# Patient Record
Sex: Female | Born: 1970 | Race: Black or African American | Hispanic: No | Smoking: Never smoker
Health system: Southern US, Community
[De-identification: ages and names within clinical notes are randomized; demographics above are authoritative.]

## PROBLEM LIST (undated history)

## (undated) DIAGNOSIS — K219 Gastro-esophageal reflux disease without esophagitis: Secondary | ICD-10-CM

## (undated) DIAGNOSIS — H548 Legal blindness, as defined in USA: Secondary | ICD-10-CM

## (undated) DIAGNOSIS — H4722 Hereditary optic atrophy: Secondary | ICD-10-CM

## (undated) DIAGNOSIS — D649 Anemia, unspecified: Secondary | ICD-10-CM

## (undated) DIAGNOSIS — F32A Depression, unspecified: Secondary | ICD-10-CM

## (undated) DIAGNOSIS — D869 Sarcoidosis, unspecified: Secondary | ICD-10-CM

## (undated) DIAGNOSIS — N200 Calculus of kidney: Secondary | ICD-10-CM

## (undated) DIAGNOSIS — R519 Headache, unspecified: Secondary | ICD-10-CM

## (undated) DIAGNOSIS — I10 Essential (primary) hypertension: Secondary | ICD-10-CM

## (undated) DIAGNOSIS — R51 Headache: Secondary | ICD-10-CM

## (undated) DIAGNOSIS — F329 Major depressive disorder, single episode, unspecified: Secondary | ICD-10-CM

## (undated) HISTORY — DX: Sarcoidosis, unspecified: D86.9

## (undated) HISTORY — DX: Calculus of kidney: N20.0

## (undated) HISTORY — DX: Essential (primary) hypertension: I10

## (undated) HISTORY — DX: Gastro-esophageal reflux disease without esophagitis: K21.9

## (undated) HISTORY — DX: Legal blindness, as defined in USA: H54.8

## (undated) HISTORY — DX: Depression, unspecified: F32.A

## (undated) HISTORY — DX: Major depressive disorder, single episode, unspecified: F32.9

## (undated) HISTORY — DX: Hereditary optic atrophy: H47.22

## (undated) HISTORY — PX: ANKLE FRACTURE SURGERY: SHX122

---

## 1997-10-27 ENCOUNTER — Encounter: Admission: RE | Admit: 1997-10-27 | Discharge: 1997-10-27 | Payer: Self-pay | Admitting: Family Medicine

## 1997-10-29 ENCOUNTER — Encounter: Admission: RE | Admit: 1997-10-29 | Discharge: 1997-10-29 | Payer: Self-pay | Admitting: Family Medicine

## 1997-11-05 ENCOUNTER — Encounter: Admission: RE | Admit: 1997-11-05 | Discharge: 1997-11-05 | Payer: Self-pay | Admitting: Family Medicine

## 1997-11-14 ENCOUNTER — Encounter: Admission: RE | Admit: 1997-11-14 | Discharge: 1997-11-14 | Payer: Self-pay | Admitting: Family Medicine

## 1997-12-10 ENCOUNTER — Encounter: Admission: RE | Admit: 1997-12-10 | Discharge: 1997-12-10 | Payer: Self-pay | Admitting: Family Medicine

## 1997-12-23 ENCOUNTER — Encounter: Admission: RE | Admit: 1997-12-23 | Discharge: 1997-12-23 | Payer: Self-pay | Admitting: Family Medicine

## 1998-01-13 ENCOUNTER — Other Ambulatory Visit: Admission: RE | Admit: 1998-01-13 | Discharge: 1998-01-13 | Payer: Self-pay | Admitting: *Deleted

## 1998-01-13 ENCOUNTER — Encounter: Admission: RE | Admit: 1998-01-13 | Discharge: 1998-01-13 | Payer: Self-pay | Admitting: Family Medicine

## 1998-02-10 ENCOUNTER — Encounter: Admission: RE | Admit: 1998-02-10 | Discharge: 1998-02-10 | Payer: Self-pay | Admitting: Sports Medicine

## 1998-02-17 ENCOUNTER — Encounter: Admission: RE | Admit: 1998-02-17 | Discharge: 1998-02-17 | Payer: Self-pay | Admitting: Sports Medicine

## 1998-05-11 ENCOUNTER — Encounter: Admission: RE | Admit: 1998-05-11 | Discharge: 1998-05-11 | Payer: Self-pay | Admitting: Sports Medicine

## 1998-06-09 ENCOUNTER — Encounter: Admission: RE | Admit: 1998-06-09 | Discharge: 1998-06-09 | Payer: Self-pay | Admitting: Family Medicine

## 1998-08-05 ENCOUNTER — Encounter: Admission: RE | Admit: 1998-08-05 | Discharge: 1998-08-05 | Payer: Self-pay | Admitting: Family Medicine

## 1998-10-01 ENCOUNTER — Encounter: Admission: RE | Admit: 1998-10-01 | Discharge: 1998-10-01 | Payer: Self-pay | Admitting: Family Medicine

## 1998-10-08 ENCOUNTER — Encounter: Admission: RE | Admit: 1998-10-08 | Discharge: 1998-10-08 | Payer: Self-pay | Admitting: Family Medicine

## 1998-10-28 ENCOUNTER — Encounter: Admission: RE | Admit: 1998-10-28 | Discharge: 1998-10-28 | Payer: Self-pay | Admitting: Family Medicine

## 1998-11-09 ENCOUNTER — Encounter: Admission: RE | Admit: 1998-11-09 | Discharge: 1998-11-09 | Payer: Self-pay | Admitting: Family Medicine

## 1999-02-22 ENCOUNTER — Encounter: Admission: RE | Admit: 1999-02-22 | Discharge: 1999-02-22 | Payer: Self-pay | Admitting: Family Medicine

## 1999-03-30 ENCOUNTER — Encounter: Admission: RE | Admit: 1999-03-30 | Discharge: 1999-03-30 | Payer: Self-pay | Admitting: Family Medicine

## 1999-04-14 ENCOUNTER — Encounter: Admission: RE | Admit: 1999-04-14 | Discharge: 1999-04-14 | Payer: Self-pay | Admitting: Family Medicine

## 1999-04-27 ENCOUNTER — Other Ambulatory Visit: Admission: RE | Admit: 1999-04-27 | Discharge: 1999-05-20 | Payer: Self-pay | Admitting: *Deleted

## 1999-04-27 ENCOUNTER — Encounter: Admission: RE | Admit: 1999-04-27 | Discharge: 1999-04-27 | Payer: Self-pay | Admitting: Sports Medicine

## 1999-04-28 ENCOUNTER — Encounter: Admission: RE | Admit: 1999-04-28 | Discharge: 1999-04-28 | Payer: Self-pay | Admitting: Family Medicine

## 1999-07-20 ENCOUNTER — Encounter: Admission: RE | Admit: 1999-07-20 | Discharge: 1999-07-20 | Payer: Self-pay | Admitting: Family Medicine

## 1999-08-03 ENCOUNTER — Encounter: Admission: RE | Admit: 1999-08-03 | Discharge: 1999-08-03 | Payer: Self-pay | Admitting: Sports Medicine

## 1999-10-18 ENCOUNTER — Encounter: Admission: RE | Admit: 1999-10-18 | Discharge: 1999-10-18 | Payer: Self-pay | Admitting: Family Medicine

## 2000-01-17 ENCOUNTER — Encounter: Admission: RE | Admit: 2000-01-17 | Discharge: 2000-01-17 | Payer: Self-pay | Admitting: Family Medicine

## 2000-03-22 ENCOUNTER — Encounter: Admission: RE | Admit: 2000-03-22 | Discharge: 2000-03-22 | Payer: Self-pay | Admitting: Family Medicine

## 2000-04-17 ENCOUNTER — Encounter: Admission: RE | Admit: 2000-04-17 | Discharge: 2000-04-17 | Payer: Self-pay | Admitting: Family Medicine

## 2000-07-13 ENCOUNTER — Encounter: Admission: RE | Admit: 2000-07-13 | Discharge: 2000-07-13 | Payer: Self-pay | Admitting: Family Medicine

## 2000-10-10 ENCOUNTER — Encounter: Admission: RE | Admit: 2000-10-10 | Discharge: 2000-10-10 | Payer: Self-pay | Admitting: Family Medicine

## 2001-01-09 ENCOUNTER — Encounter: Admission: RE | Admit: 2001-01-09 | Discharge: 2001-01-09 | Payer: Self-pay | Admitting: Family Medicine

## 2001-04-27 ENCOUNTER — Encounter: Admission: RE | Admit: 2001-04-27 | Discharge: 2001-04-27 | Payer: Self-pay | Admitting: Family Medicine

## 2001-07-02 ENCOUNTER — Encounter: Admission: RE | Admit: 2001-07-02 | Discharge: 2001-07-02 | Payer: Self-pay | Admitting: Family Medicine

## 2001-07-04 ENCOUNTER — Encounter: Admission: RE | Admit: 2001-07-04 | Discharge: 2001-07-04 | Payer: Self-pay | Admitting: Family Medicine

## 2001-08-22 ENCOUNTER — Encounter: Admission: RE | Admit: 2001-08-22 | Discharge: 2001-08-22 | Payer: Self-pay | Admitting: Family Medicine

## 2001-10-30 ENCOUNTER — Encounter: Admission: RE | Admit: 2001-10-30 | Discharge: 2001-10-30 | Payer: Self-pay | Admitting: Family Medicine

## 2002-01-14 ENCOUNTER — Encounter: Admission: RE | Admit: 2002-01-14 | Discharge: 2002-01-14 | Payer: Self-pay | Admitting: Family Medicine

## 2002-03-18 ENCOUNTER — Encounter: Admission: RE | Admit: 2002-03-18 | Discharge: 2002-03-18 | Payer: Self-pay | Admitting: Family Medicine

## 2002-05-28 ENCOUNTER — Encounter: Admission: RE | Admit: 2002-05-28 | Discharge: 2002-05-28 | Payer: Self-pay | Admitting: Family Medicine

## 2002-07-02 ENCOUNTER — Encounter: Admission: RE | Admit: 2002-07-02 | Discharge: 2002-07-02 | Payer: Self-pay | Admitting: Sports Medicine

## 2002-10-09 ENCOUNTER — Encounter: Admission: RE | Admit: 2002-10-09 | Discharge: 2002-10-09 | Payer: Self-pay | Admitting: Family Medicine

## 2003-02-12 ENCOUNTER — Encounter: Admission: RE | Admit: 2003-02-12 | Discharge: 2003-02-12 | Payer: Self-pay | Admitting: Family Medicine

## 2003-06-11 ENCOUNTER — Encounter: Admission: RE | Admit: 2003-06-11 | Discharge: 2003-06-11 | Payer: Self-pay | Admitting: Family Medicine

## 2003-08-05 ENCOUNTER — Encounter: Admission: RE | Admit: 2003-08-05 | Discharge: 2003-08-05 | Payer: Self-pay | Admitting: Family Medicine

## 2003-08-27 ENCOUNTER — Other Ambulatory Visit: Admission: RE | Admit: 2003-08-27 | Discharge: 2003-08-27 | Payer: Self-pay | Admitting: Family Medicine

## 2003-08-27 ENCOUNTER — Encounter: Admission: RE | Admit: 2003-08-27 | Discharge: 2003-08-27 | Payer: Self-pay | Admitting: Family Medicine

## 2003-10-13 ENCOUNTER — Encounter: Admission: RE | Admit: 2003-10-13 | Discharge: 2003-10-13 | Payer: Self-pay | Admitting: Sports Medicine

## 2004-03-08 ENCOUNTER — Ambulatory Visit: Payer: Self-pay | Admitting: Family Medicine

## 2004-07-29 ENCOUNTER — Ambulatory Visit: Payer: Self-pay

## 2004-09-04 ENCOUNTER — Encounter (INDEPENDENT_AMBULATORY_CARE_PROVIDER_SITE_OTHER): Payer: Self-pay | Admitting: *Deleted

## 2004-09-23 ENCOUNTER — Ambulatory Visit: Payer: Self-pay | Admitting: Sports Medicine

## 2004-09-23 ENCOUNTER — Other Ambulatory Visit: Admission: RE | Admit: 2004-09-23 | Discharge: 2004-09-23 | Payer: Self-pay | Admitting: Family Medicine

## 2004-11-09 ENCOUNTER — Ambulatory Visit: Payer: Self-pay | Admitting: Family Medicine

## 2005-02-01 ENCOUNTER — Ambulatory Visit: Payer: Self-pay | Admitting: Sports Medicine

## 2005-04-11 ENCOUNTER — Ambulatory Visit: Payer: Self-pay | Admitting: Family Medicine

## 2005-05-08 ENCOUNTER — Inpatient Hospital Stay (HOSPITAL_COMMUNITY): Admission: AD | Admit: 2005-05-08 | Discharge: 2005-05-08 | Payer: Self-pay | Admitting: *Deleted

## 2005-05-10 ENCOUNTER — Ambulatory Visit: Payer: Self-pay | Admitting: Sports Medicine

## 2005-08-04 ENCOUNTER — Ambulatory Visit: Payer: Self-pay | Admitting: Family Medicine

## 2005-08-18 ENCOUNTER — Ambulatory Visit: Payer: Self-pay | Admitting: Family Medicine

## 2005-09-24 ENCOUNTER — Emergency Department (HOSPITAL_COMMUNITY): Admission: EM | Admit: 2005-09-24 | Discharge: 2005-09-24 | Payer: Self-pay | Admitting: Family Medicine

## 2005-10-11 ENCOUNTER — Ambulatory Visit: Payer: Self-pay | Admitting: Sports Medicine

## 2005-10-18 ENCOUNTER — Ambulatory Visit: Payer: Self-pay | Admitting: Sports Medicine

## 2005-11-07 ENCOUNTER — Ambulatory Visit: Payer: Self-pay | Admitting: Family Medicine

## 2005-12-19 ENCOUNTER — Ambulatory Visit: Payer: Self-pay | Admitting: Family Medicine

## 2005-12-30 ENCOUNTER — Ambulatory Visit: Payer: Self-pay | Admitting: Family Medicine

## 2006-02-13 ENCOUNTER — Ambulatory Visit: Payer: Self-pay | Admitting: Family Medicine

## 2006-03-13 ENCOUNTER — Ambulatory Visit: Payer: Self-pay | Admitting: Family Medicine

## 2006-07-06 ENCOUNTER — Ambulatory Visit: Payer: Self-pay | Admitting: Family Medicine

## 2006-07-18 ENCOUNTER — Ambulatory Visit: Payer: Self-pay | Admitting: Family Medicine

## 2006-08-03 DIAGNOSIS — I10 Essential (primary) hypertension: Secondary | ICD-10-CM | POA: Insufficient documentation

## 2006-08-04 ENCOUNTER — Encounter (INDEPENDENT_AMBULATORY_CARE_PROVIDER_SITE_OTHER): Payer: Self-pay | Admitting: *Deleted

## 2006-08-07 ENCOUNTER — Ambulatory Visit: Payer: Self-pay | Admitting: Sports Medicine

## 2006-09-20 ENCOUNTER — Telehealth (INDEPENDENT_AMBULATORY_CARE_PROVIDER_SITE_OTHER): Payer: Self-pay | Admitting: *Deleted

## 2006-10-19 ENCOUNTER — Telehealth: Payer: Self-pay | Admitting: *Deleted

## 2006-10-20 ENCOUNTER — Ambulatory Visit: Payer: Self-pay | Admitting: Family Medicine

## 2007-03-27 ENCOUNTER — Ambulatory Visit: Payer: Self-pay | Admitting: Family Medicine

## 2007-03-27 ENCOUNTER — Encounter: Payer: Self-pay | Admitting: Family Medicine

## 2007-03-27 ENCOUNTER — Telehealth (INDEPENDENT_AMBULATORY_CARE_PROVIDER_SITE_OTHER): Payer: Self-pay | Admitting: *Deleted

## 2007-04-16 ENCOUNTER — Encounter (INDEPENDENT_AMBULATORY_CARE_PROVIDER_SITE_OTHER): Payer: Self-pay | Admitting: *Deleted

## 2007-04-16 ENCOUNTER — Ambulatory Visit: Payer: Self-pay | Admitting: Family Medicine

## 2007-06-14 ENCOUNTER — Encounter (INDEPENDENT_AMBULATORY_CARE_PROVIDER_SITE_OTHER): Payer: Self-pay | Admitting: Family Medicine

## 2007-06-14 ENCOUNTER — Ambulatory Visit: Payer: Self-pay | Admitting: Family Medicine

## 2007-06-14 LAB — CONVERTED CEMR LAB
BUN: 11 mg/dL (ref 6–23)
Chlamydia, DNA Probe: NEGATIVE
Chloride: 102 meq/L (ref 96–112)
Creatinine, Ser: 0.74 mg/dL (ref 0.40–1.20)
Glucose, Bld: 96 mg/dL (ref 70–99)

## 2007-06-29 ENCOUNTER — Encounter (INDEPENDENT_AMBULATORY_CARE_PROVIDER_SITE_OTHER): Payer: Self-pay | Admitting: Family Medicine

## 2007-09-05 ENCOUNTER — Emergency Department (HOSPITAL_COMMUNITY): Admission: EM | Admit: 2007-09-05 | Discharge: 2007-09-05 | Payer: Self-pay | Admitting: Emergency Medicine

## 2007-09-10 ENCOUNTER — Encounter (INDEPENDENT_AMBULATORY_CARE_PROVIDER_SITE_OTHER): Payer: Self-pay | Admitting: Family Medicine

## 2007-09-10 ENCOUNTER — Ambulatory Visit: Payer: Self-pay | Admitting: Family Medicine

## 2007-09-10 LAB — CONVERTED CEMR LAB
AST: 16 units/L (ref 0–37)
Albumin: 4.4 g/dL (ref 3.5–5.2)
Alkaline Phosphatase: 31 units/L — ABNORMAL LOW (ref 39–117)
Basophils Absolute: 0 10*3/uL (ref 0.0–0.1)
Basophils Relative: 0 % (ref 0–1)
Bilirubin Urine: NEGATIVE
Eosinophils Absolute: 0 10*3/uL (ref 0.0–0.7)
Eosinophils Relative: 0 % (ref 0–5)
Glucose, Bld: 103 mg/dL — ABNORMAL HIGH (ref 70–99)
HCT: 42.6 % (ref 36.0–46.0)
MCHC: 33.6 g/dL (ref 30.0–36.0)
MCV: 95.3 fL (ref 78.0–100.0)
Neutrophils Relative %: 66 % (ref 43–77)
Nitrite: NEGATIVE
Platelets: 353 10*3/uL (ref 150–400)
Potassium: 3.7 meq/L (ref 3.5–5.3)
Protein, U semiquant: NEGATIVE
RDW: 13.2 % (ref 11.5–15.5)
Sodium: 139 meq/L (ref 135–145)
Total Bilirubin: 1 mg/dL (ref 0.3–1.2)
Total Protein: 7.5 g/dL (ref 6.0–8.3)
Urobilinogen, UA: 0.2
WBC Urine, dipstick: NEGATIVE
WBC: 5.4 10*3/uL (ref 4.0–10.5)

## 2008-01-13 ENCOUNTER — Emergency Department (HOSPITAL_COMMUNITY): Admission: EM | Admit: 2008-01-13 | Discharge: 2008-01-13 | Payer: Self-pay | Admitting: Family Medicine

## 2008-03-06 ENCOUNTER — Telehealth: Payer: Self-pay | Admitting: *Deleted

## 2008-03-07 ENCOUNTER — Ambulatory Visit: Payer: Self-pay | Admitting: Family Medicine

## 2008-04-22 ENCOUNTER — Telehealth: Payer: Self-pay | Admitting: *Deleted

## 2008-04-24 ENCOUNTER — Telehealth (INDEPENDENT_AMBULATORY_CARE_PROVIDER_SITE_OTHER): Payer: Self-pay | Admitting: *Deleted

## 2008-05-12 ENCOUNTER — Telehealth: Payer: Self-pay | Admitting: *Deleted

## 2008-08-26 ENCOUNTER — Ambulatory Visit: Payer: Self-pay | Admitting: Family Medicine

## 2008-08-26 ENCOUNTER — Encounter (INDEPENDENT_AMBULATORY_CARE_PROVIDER_SITE_OTHER): Payer: Self-pay | Admitting: Family Medicine

## 2008-08-26 DIAGNOSIS — H4722 Hereditary optic atrophy: Secondary | ICD-10-CM

## 2008-08-26 HISTORY — DX: Hereditary optic atrophy: H47.22

## 2008-08-26 LAB — CONVERTED CEMR LAB
Bilirubin Urine: NEGATIVE
Chloride: 100 meq/L (ref 96–112)
Cholesterol: 178 mg/dL (ref 0–200)
Ketones, urine, test strip: NEGATIVE
LDL Cholesterol: 72 mg/dL (ref 0–99)
Nitrite: NEGATIVE
Potassium: 3.7 meq/L (ref 3.5–5.3)
Protein, U semiquant: NEGATIVE
Sodium: 138 meq/L (ref 135–145)
Total CHOL/HDL Ratio: 2.8
Triglycerides: 211 mg/dL — ABNORMAL HIGH (ref <150)
Urobilinogen, UA: 1
VLDL: 42 mg/dL — ABNORMAL HIGH (ref 0–40)

## 2008-08-28 ENCOUNTER — Encounter (INDEPENDENT_AMBULATORY_CARE_PROVIDER_SITE_OTHER): Payer: Self-pay | Admitting: Family Medicine

## 2008-09-15 ENCOUNTER — Telehealth: Payer: Self-pay | Admitting: *Deleted

## 2008-09-17 ENCOUNTER — Telehealth: Payer: Self-pay | Admitting: *Deleted

## 2008-09-18 ENCOUNTER — Telehealth: Payer: Self-pay | Admitting: *Deleted

## 2008-10-08 ENCOUNTER — Ambulatory Visit: Payer: Self-pay | Admitting: Family Medicine

## 2008-10-08 ENCOUNTER — Encounter (INDEPENDENT_AMBULATORY_CARE_PROVIDER_SITE_OTHER): Payer: Self-pay | Admitting: *Deleted

## 2008-12-15 ENCOUNTER — Encounter: Payer: Self-pay | Admitting: Family Medicine

## 2008-12-22 ENCOUNTER — Ambulatory Visit (HOSPITAL_COMMUNITY): Admission: RE | Admit: 2008-12-22 | Discharge: 2008-12-22 | Payer: Self-pay | Admitting: Dermatology

## 2008-12-24 ENCOUNTER — Encounter: Payer: Self-pay | Admitting: Family Medicine

## 2009-01-22 ENCOUNTER — Ambulatory Visit: Payer: Self-pay | Admitting: Family Medicine

## 2009-01-22 DIAGNOSIS — D863 Sarcoidosis of skin: Secondary | ICD-10-CM

## 2009-01-22 LAB — CONVERTED CEMR LAB
Bilirubin Urine: NEGATIVE
Glucose, Urine, Semiquant: NEGATIVE
Ketones, urine, test strip: NEGATIVE
Nitrite: NEGATIVE
Protein, U semiquant: NEGATIVE
Specific Gravity, Urine: 1.015
Urobilinogen, UA: 0.2
WBC Urine, dipstick: NEGATIVE
pH: 7

## 2009-02-10 ENCOUNTER — Ambulatory Visit: Payer: Self-pay | Admitting: Family Medicine

## 2009-02-18 ENCOUNTER — Ambulatory Visit: Payer: Self-pay | Admitting: Family Medicine

## 2009-03-18 ENCOUNTER — Ambulatory Visit: Payer: Self-pay | Admitting: Family Medicine

## 2009-05-13 ENCOUNTER — Ambulatory Visit: Payer: Self-pay | Admitting: Family Medicine

## 2009-05-13 DIAGNOSIS — F341 Dysthymic disorder: Secondary | ICD-10-CM

## 2009-08-06 ENCOUNTER — Telehealth: Payer: Self-pay | Admitting: Family Medicine

## 2009-11-03 ENCOUNTER — Encounter: Payer: Self-pay | Admitting: Family Medicine

## 2009-11-03 ENCOUNTER — Ambulatory Visit: Payer: Self-pay | Admitting: Family Medicine

## 2009-11-03 LAB — CONVERTED CEMR LAB
ALT: 30 units/L (ref 0–35)
AST: 21 units/L (ref 0–37)
Albumin: 4.3 g/dL (ref 3.5–5.2)
Alkaline Phosphatase: 45 units/L (ref 39–117)
BUN: 11 mg/dL (ref 6–23)
CO2: 22 meq/L (ref 19–32)
Calcium: 10 mg/dL (ref 8.4–10.5)
Chloride: 102 meq/L (ref 96–112)
Creatinine, Ser: 0.89 mg/dL (ref 0.40–1.20)
Glucose, Bld: 105 mg/dL — ABNORMAL HIGH (ref 70–99)
HCT: 40.1 % (ref 36.0–46.0)
Hemoglobin: 13.8 g/dL (ref 12.0–15.0)
MCHC: 34.4 g/dL (ref 30.0–36.0)
MCV: 92.6 fL (ref 78.0–100.0)
Platelets: 269 10*3/uL (ref 150–400)
Potassium: 3.6 meq/L (ref 3.5–5.3)
RBC: 4.33 M/uL (ref 3.87–5.11)
RDW: 13.7 % (ref 11.5–15.5)
Sodium: 137 meq/L (ref 135–145)
TSH: 1.982 microintl units/mL (ref 0.350–4.500)
Total Bilirubin: 0.7 mg/dL (ref 0.3–1.2)
Total Protein: 7.4 g/dL (ref 6.0–8.3)
Vit D, 25-Hydroxy: 21 ng/mL — ABNORMAL LOW (ref 30–89)
WBC: 5.1 10*3/uL (ref 4.0–10.5)

## 2009-11-09 ENCOUNTER — Encounter: Payer: Self-pay | Admitting: Family Medicine

## 2009-12-10 ENCOUNTER — Telehealth: Payer: Self-pay | Admitting: Family Medicine

## 2010-01-01 ENCOUNTER — Telehealth: Payer: Self-pay | Admitting: Family Medicine

## 2010-03-15 ENCOUNTER — Telehealth: Payer: Self-pay | Admitting: Family Medicine

## 2010-03-17 ENCOUNTER — Ambulatory Visit: Payer: Self-pay | Admitting: Family Medicine

## 2010-03-17 DIAGNOSIS — E663 Overweight: Secondary | ICD-10-CM | POA: Insufficient documentation

## 2010-03-23 ENCOUNTER — Telehealth: Payer: Self-pay | Admitting: Family Medicine

## 2010-03-25 ENCOUNTER — Telehealth: Payer: Self-pay | Admitting: Family Medicine

## 2010-04-05 ENCOUNTER — Ambulatory Visit: Payer: Self-pay | Admitting: Family Medicine

## 2010-04-05 ENCOUNTER — Encounter: Payer: Self-pay | Admitting: Family Medicine

## 2010-04-05 LAB — CONVERTED CEMR LAB: Beta hcg, urine, semiquantitative: NEGATIVE

## 2010-05-16 ENCOUNTER — Telehealth: Payer: Self-pay | Admitting: Family Medicine

## 2010-06-25 ENCOUNTER — Telehealth: Payer: Self-pay | Admitting: *Deleted

## 2010-07-08 NOTE — Assessment & Plan Note (Signed)
Summary: f/u last visit/eo   Vital Signs:  Patient profile:   40 year old female Weight:      159.5 pounds BMI:     28.36 Temp:     97.5 degrees F Pulse rate:   85 / minute BP sitting:   123 / 81  (right arm)  Vitals Entered By: Starleen Blue RN (March 18, 2009 3:35 PM) CC: f/u Is Patient Diabetic? No Pain Assessment Patient in pain? no        Primary Care Provider:  Helane Rima DO  CC:  f/u.  History of Present Illness: 40 y/o AAF presenting in place of son since he is at [facility] for nausea/vomiting/abdominal pain unknown etiology.  Today, she states that she "feels awful" and is upset that she has gained 4 pounds since our last visit one month ago. She has been stressed about her son's health. He has been dealing with the above issue for  ~ 2 weeks now and is still in the hospital. She has not been exercising (she usually does this daily) or eating well since she has been staying with her son. She also wants to make sure that she hasn't gotten anything from the hospital and asks me to look at her neck for enlarged lymph nodes.   Habits & Providers  Alcohol-Tobacco-Diet     Tobacco Status: never  Current Medications (verified): 1)  Omeprazole 20 Mg Cpdr (Omeprazole) .... Take 1 Capsule By Mouth Once A Day 2)  Citalopram Hydrobromide 20 Mg Tabs (Citalopram Hydrobromide) .... One By Mouth Daily, Once Daily 3)  Sprintec 28 0.25-35 Mg-Mcg Tabs (Norgestimate-Eth Estradiol) .Marland Kitchen.. 1 Tablet By Mouth Daily.  Dispense 3 Packs 4)  Lisinopril-Hydrochlorothiazide 20-25 Mg  Tabs (Lisinopril-Hydrochlorothiazide) .... Take 1 Tab By Mouth Daily 5)  Zyrtec Allergy 10 Mg Tabs (Cetirizine Hcl) .Marland Kitchen.. 1 Tablet By Mouth Daily For Allergies 6)  Plaquenil 200 Mg Tabs (Hydroxychloroquine Sulfate) .... One By Mouth Two Times A Day 7)  Fluticasone Propionate 50 Mcg/act  Susp (Fluticasone Propionate) .... 2 Sprays Each Nostril Once Daily  Allergies (verified): No Known Drug Allergies  Past  History:  Past medical, surgical, family and social histories (including risk factors) reviewed for relevance to current acute and chronic problems.  Past Medical History: Reviewed history from 01/22/2009 and no changes required. 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 Emily Filbert, [facility], [phone] Depression GERD HTN  Past Surgical History: Reviewed history from 02/18/2009 and no changes required. cystoscopy - wnl - for microscopic hematuria - 12/04/1996 Mirena IUD- 03/2004 - 09/23/2004 C. section  Family History: Reviewed history from 08/26/2008 and no changes required. father with HTN, brain ca (deceased in Nov 04, 2002) Mother died at 78 yo of liver failure secondary to EtOH No breast ca in family.  Social History: Reviewed history from 08/26/2008 and no changes required. Separated from husband.  Has son 18yo who lives at home with her (born 11-03-1989).   No tob, etoh, drugs.  Father with end stage brain ca passed 8/04.  Works 40 hours/week at [employer] of the [employer], uses bus for transportation.  Able to read with a magnifying glass.  Review of Systems General:  Complains of fatigue; denies chills and fever. ENT:  Denies decreased hearing, difficulty swallowing, earache, hoarseness, sinus pressure, and sore throat. Resp:  Denies cough. Psych:  Complains of anxiety and depression;  denies easily angered, panic attacks, suicidal thoughts/plans, and thoughts /plans of harming others. Allergy:  Complains of seasonal allergies.  Physical Exam  General:  alert, appropriate dress, and normal appearance. vitals reviewed. Head:  normocephalic and atraumatic.   Ears:  R ear normal and L ear normal.   Nose:  no nasal discharge.   Mouth:  pharynx pink and moist.   Neck:  no cervical lymphadenopathy.   Lungs:  CTAB Heart:  RRR no m/r/g Psych:   Oriented X3 and slightly anxious.     Impression & Recommendations:  Problem # 1:  MALAISE AND FATIGUE (ICD-780.79) Assessment New  Acute and due to stress of dealing with son in hospital with unknown etiology of s/s. She has not been taking care of herself. She usually exercises daily and eats a healthy diet but has not done this since staying with her son. She is also concerned that she will catch something while there. Reassured patient that while we don't know why her son is having his s/s, we are doing a thorough work-up and keeping him confortable and healthy. Advised her to remember to take care of her health as well.   Orders: [facility]- Est Level  3 (04540)  Problem # 2:  HYPERTENSION, BENIGN SYSTEMIC (ICD-401.1) Assessment: Unchanged  Her updated medication list for this problem includes:    Lisinopril-hydrochlorothiazide 20-25 Mg Tabs (Lisinopril-hydrochlorothiazide) .Marland Kitchen... Take 1 tab by mouth daily  Orders: [facility]- Est Level  3 (98119)  Complete Medication List: 1)  Omeprazole 20 Mg Cpdr (Omeprazole) .... Take 1 capsule by mouth once a day 2)  Citalopram Hydrobromide 20 Mg Tabs (Citalopram hydrobromide) .... One by mouth daily, once daily 3)  Sprintec 28 0.25-35 Mg-mcg Tabs (Norgestimate-eth estradiol) .Marland Kitchen.. 1 tablet by mouth daily.  dispense 3 packs 4)  Lisinopril-hydrochlorothiazide 20-25 Mg Tabs (Lisinopril-hydrochlorothiazide) .... Take 1 tab by mouth daily 5)  Zyrtec Allergy 10 Mg Tabs (Cetirizine hcl) .Marland Kitchen.. 1 tablet by mouth daily for allergies 6)  Plaquenil 200 Mg Tabs (Hydroxychloroquine sulfate) .... One by mouth two times a day 7)  Fluticasone Propionate 50 Mcg/act Susp (Fluticasone propionate) .... 2 sprays each nostril once daily  Other Orders: Influenza Vaccine MCR (14782)  Patient Instructions: 1)  I'm glad that you came in today. We'll keep working on finding out what is going on with your son. Until then, we will keep him comfortable and healthy.  2)  Take care of  yourself. This is a stressful time. Continue exercise and eating a healthy diet.  3)  Your blood pressure looks great. Prescriptions: FLUTICASONE PROPIONATE 50 MCG/ACT  SUSP (FLUTICASONE PROPIONATE) 2 sprays each nostril once daily  #1 vial x 3   Entered and Authorized by:   Helane Rima DO   Signed by:   Helane Rima DO on 03/18/2009   Method used:   Electron[facility] to        [facility] [address] [phone]* (retail)       [address]       [place], [place]  [address]       Ph: [phone] or [phone]       Fax: [phone]   RxID:   [id number] PLAQUENIL 200 MG TABS (HYDROXYCHLOROQUINE SULFATE) one by mouth two times a day  #60 x 3   Entered and Authorized by:   Helane Rima DO   Signed by:   Helane Rima DO on 03/18/2009   Method used:   [facility]ronically  [facility]        [facility] [address] [phone]* (retail)       [address]       [place], [place]  [address]       Ph: [phone] or [phone]       Fax: [phone]   RxID:   [id number] ZYRTEC ALLERGY 10 MG TABS (CETIRIZINE HCL) 1 tablet by mouth daily for allergies  #30 x 4   Entered and Authorized by:   Helane Rima DO   Signed by:   Helane Rima DO on 03/18/2009   Method used:   [facility]        [facility] [address] [phone]* (retail)       [address]       [place], [place]  [address]       Ph: [phone] or [phone]       Fax: [phone]   RxID:   [id number] LISINOPRIL-HYDROCHLOROTHIAZIDE 20-25 MG  TABS (LISINOPRIL-HYDROCHLOROTHIAZIDE) Take 1 tab by mouth daily  #30 x 5   Entered and Authorized by:   Helane Rima DO   Signed by:   Helane Rima DO on 03/18/2009   Method used:   Electronically to        [facility] [address] [phone]* (retail)       [address]       [place], [place]  [address]       Ph: [phone] or [phone]       Fax: [phone]   RxID:   [id number] SPRINTEC 28  0.25-35 MG-MCG TABS (NORGESTIMATE-ETH ESTRADIOL) 1 tablet by mouth daily.  dispense 3 packs  #3 x 3   Entered and Authorized by:   Helane Rima DO   Signed by:   Helane Rima DO on 03/18/2009   Method used:   [facility]ronically to        [facility] [address] [phone]* (retail)       [address]       [place], [place]  [address]       Ph: [phone] or [phone]       Fax: [phone]   RxID:   [id number] CITALOPRAM HYDROBROMIDE 20 MG TABS (CITALOPRAM HYDROBROMIDE) one by mouth daily, once daily  #30 x 0   Entered and Authorized by:   Helane Rima DO   Signed by:   Helane Rima DO on 03/18/2009   Method used:   Electronically to        [facility] [address] [phone]* (retail)       [address]       [place], [place]  [address]       Ph: [phone] or [phone]       Fax: [phone]   RxID:   [id number] OMEPRAZOLE 20 MG CPDR (OMEPRAZOLE) Take 1 capsule by mouth once a day  #30 Capsule x 11   Entered and Authorized by:   Helane Rima DO   Signed by:   Helane Rima DO on 03/18/2009   Method used:   [facility]ronically to        [facility] [address] [phone]* (retail)       [address]       [place]  [place], [place]  [address]       Ph: [phone] or [phone]       Fax: [phone]   RxID:   [id number]    Influenza Vaccine    Vaccine Type: Fluvax MCR    Site: left deltoid    Mfr: GlaxoSmithKline    Dose: 0.5 ml    Route: IM    Given by: Theresia Lo RN    Exp. Date: 12/03/2009    Lot #: [id number]    VIS given: 01/13/2009  Flu Vaccine Consent Questions    Do you have a history of severe allergic reactions to this vaccine? no    Any prior history of allergic reactions to egg and/or gelatin? no    Do you have a sensitivity to the preservative Thimersol? no    Do you have a past history of Guillan-Barre Syndrome? no    Do you currently have an acute febrile illness? no    Have you ever  had a severe reaction to latex? no    Vaccine information given and explained to patient? yes    Are you currently pregnant? no    Prevention & Chronic Care Immunizations   Influenza vaccine: Fluvax MCR  (03/18/2009)   Influenza vaccine due: 03/07/2009    Tetanus booster: 08/26/2008: given   Tetanus booster due: 08/27/2018    Pneumococcal vaccine: Not documented  Other Screening   Pap smear: NEGATIVE FOR INTRAEPITHELIAL LESIONS OR MALIGNANCY.  (08/26/2008)   Pap smear due: 06/2009   Smoking status: never  (03/18/2009)  Lipids   Total Cholesterol: 178  (08/26/2008)   LDL: 72  (08/26/2008)   LDL Direct: Not documented   HDL: 64  (08/26/2008)   Triglycerides: 211  (08/26/2008)  Hypertension   Last Blood Pressure: 123 / 81  (03/18/2009)   Serum creatinine: 0.83  (08/26/2008)   Serum potassium 3.7  (08/26/2008)    Hypertension flowsheet reviewed?: Yes   Progress toward BP goal: At goal  Self-Management Support :   Personal Goals (by the next clinic visit) :      Personal blood pressure goal: 140/90  (02/10/2009)   Patient will work on the following items until the next clinic visit to reach self-care goals:     Medications and monitoring: take my medicines every day  (03/18/2009)     Eating: drink diet soda or water instead of juice or soda, use fresh or frozen vegetables, eat foods that are low in salt, eat baked foods instead of fried foods, eat fruit for snacks and desserts  (02/18/2009)     Activity: take a 30 minute walk every day, take the stairs instead of the elevator, park at the far end of the parking lot  (03/18/2009)    Hypertension self-management support: Written self-care plan  (03/18/2009)   Hypertension self-care plan printed.    Hypertension self-management support not done because: Good outcomes  (02/10/2009)

## 2010-07-08 NOTE — Assessment & Plan Note (Signed)
Summary: HA with periods. /[place]/Allison Steele   Vital Signs:  Patient profile:   40 year old female Height:      63 inches Weight:      164.50 pounds BMI:     29.25 BSA:     1.78 Pulse rate:   94 / minute BP sitting:   128 / 90  Vitals Entered By: Jone Baseman CMA (March 17, 2010 9:28 AM) CC: HA with periods Is Patient Diabetic? No Pain Assessment Patient in pain? yes     Location: head Intensity: 4   Primary Care Provider:  Helane Rima DO  CC:  HA with periods.  History of Present Illness: 40 yo F:  1. HA: Every time she has her menses, lasts about one week, never happened before OCPs, checks BP when in pain and highest = 156/93. OCPs were started for vaginal bleeding. Has tolerated Depo in the past. HA is generalized, pounding, sometimes associated with photo/phonophobia, and nausea. Made slightly better with NSAIDs.  2. Weight Gain: "I'm hungry all the time." Was working out regularly, but has not been going as often.  Habits & Providers  Alcohol-Tobacco-Diet     Alcohol drinks/day: 0     Tobacco Status: never     Tobacco Counseling: not indicated; no tobacco use  Current Medications (verified): 1)  Omeprazole 20 Mg Cpdr (Omeprazole) .... Take 1 Capsule By Mouth Once A Day 2)  Citalopram Hydrobromide 20 Mg Tabs (Citalopram Hydrobromide) .... One By Mouth Daily, Once Daily 3)  Lisinopril-Hydrochlorothiazide 20-25 Mg  Tabs (Lisinopril-Hydrochlorothiazide) .... Take 1 Tab By Mouth Daily 4)  Zyrtec Allergy 10 Mg Tabs (Cetirizine Hcl) .Marland Kitchen.. 1 Tablet By Mouth Daily For Allergies 5)  Plaquenil 200 Mg Tabs (Hydroxychloroquine Sulfate) .... One By Mouth Two Times A Day 6)  Fluticasone Propionate 50 Mcg/act  Susp (Fluticasone Propionate) .... 2 Sprays Each Nostril Once Daily 7)  Miralax   Powd (Polyethylene Glycol 3350) .Marland Kitchen.. 17g By Mouth Once Daily As Needed Constipation 8)  Wellbutrin Sr 150 Mg Xr12h-Tab (Bupropion Hcl) .... One By Mouth Daily 9)  Hydrocodone-Acetaminophen  5-325 Mg Tabs (Hydrocodone-Acetaminophen) .Marland Kitchen.. 1 By Mouth Up To 4 Times Per Day As Needed For Pain  Allergies (verified): No Known Drug Allergies PMH-FH-SH reviewed for relevance  Review of Systems General:  Denies chills and fever. ENT:  Denies earache, nasal congestion, and sore throat. CV:  Denies chest pain or discomfort and shortness of breath with exertion. Resp:  Denies cough. GI:  Denies change in bowel habits. Neuro:  Complains of headaches.  Physical Exam  General:  Alert, appropriate dress, and normal appearance. Vitals reviewed. Head:  Normocephalic and atraumatic without obvious abnormalities.  Eyes:  Decreased vision at baseline. Pupils equal, pupils round, and pupils reactive to light.   Ears:  R ear normal and L ear normal.   Nose:  External nasal examination shows no deformity or inflammation. Nasal mucosa are pink and moist without lesions or exudates. Mouth:  Oral mucosa and oropharynx without lesions or exudates.  Teeth in good repair. Neck:  No deformities, masses, or tenderness noted. Neurologic:  Alert & oriented X3, cranial nerves II-XII intact, strength normal in all extremities, sensation intact to light touch, and gait normal.   Psych:  Memory intact for recent and remote, normally interactive, and slightly anxious.     Impression & Recommendations:  Problem # 1:  HEADACHE, MENSTRUAL (ICD-784.0)  No Imitrex 2/2 SSRI. Stop OCPs.  Her updated medication list for this problem includes:  Hydrocodone-acetaminophen 5-325 Mg Tabs (Hydrocodone-acetaminophen) .Marland Kitchen... 1 by mouth up to 4 times per day as needed for pain  Orders: [facility]- Est  Level 4 (81191)  Problem # 2:  MENORRHAGIA (ICD-626.2)  Discussed Implanon. The following medications were removed from the medication list:    Sprintec 28 0.25-35 Mg-mcg Tabs (Norgestimate-eth estradiol) ..... One by mouth daily per instructions  Orders: [facility]- Est  Level 4 (47829)  Problem # 3:  OVERWEIGHT  (ICD-278.02) Assessment: New  Patient interested in talking to nutritionist. Will refer.  Orders: [facility]- Est  Level 4 (56213)  Complete Medication List: 1)  Omeprazole 20 Mg Cpdr (Omeprazole) .... Take 1 capsule by mouth once a day 2)  Citalopram Hydrobromide 20 Mg Tabs (Citalopram hydrobromide) .... One by mouth daily, once daily 3)  Lisinopril-hydrochlorothiazide 20-25 Mg Tabs (Lisinopril-hydrochlorothiazide) .... Take 1 tab by mouth daily 4)  Zyrtec Allergy 10 Mg Tabs (Cetirizine hcl) .Marland Kitchen.. 1 tablet by mouth daily for allergies 5)  Plaquenil 200 Mg Tabs (Hydroxychloroquine sulfate) .... One by mouth two times a day 6)  Fluticasone Propionate 50 Mcg/act Susp (Fluticasone propionate) .... 2 sprays each nostril once daily 7)  Miralax Powd (Polyethylene glycol 3350) .Marland Kitchen.. 17g by mouth once daily as needed constipation 8)  Wellbutrin Sr 150 Mg Xr12h-tab (Bupropion hcl) .... One by mouth daily 9)  Hydrocodone-acetaminophen 5-325 Mg Tabs (Hydrocodone-acetaminophen) .Marland Kitchen.. 1 by mouth up to 4 times per day as needed for pain  Other Orders: Flu Vaccine 64yrs + (08657) Admin 1st Vaccine (84696) Admin 1st Vaccine [facility]) [phone])  Patient Instructions: 1)  It was nice to see you today! 2)  We are giving you the flu shot.  3)  Please make an appointment for an Implanon insertion.  4)  Stop the birth control pill. 5)  I am prescribing Imitrex and Vicodin for your migraine medicines. 6)  Please make an appointment with Dr. Gerilyn Pilgrim for nutrition therapy. Prescriptions: HYDROCODONE-ACETAMINOPHEN 5-325 MG TABS (HYDROCODONE-ACETAMINOPHEN) 1 by mouth up to 4 times per day as needed for pain  #30 x 0   Entered and Authorized by:   Helane Rima DO   Signed by:   Helane Rima DO on 03/17/2010   Method used:   Print then Give to Patient   RxID:   [id number]    Influenza Vaccine    Vaccine Type: Fluvax 3+    Site: left deltoid    Mfr: GlaxoSmithKline    Dose: 0.5 ml    Route: IM    Given by:  Garen Grams LPN    Exp. Date: 12/01/2010    Lot #: [id number]    VIS given: 12/29/09 version given March 17, 2010.  Flu Vaccine Consent Questions    Do you have a history of severe allergic reactions to this vaccine? no    Any prior history of allergic reactions to egg and/or gelatin? no    Do you have a sensitivity to the preservative Thimersol? no    Do you have a past history of Guillan-Barre Syndrome? no    Do you currently have an acute febrile illness? no    Have you ever had a severe reaction to latex? no    Vaccine information given and explained to patient? yes    Are you currently pregnant? no

## 2010-07-08 NOTE — Assessment & Plan Note (Signed)
Summary: constipation   Vital Signs:  Patient profile:   40 year old female Height:      63 inches Weight:      163 pounds BMI:     28.98 Temp:     98.4 degrees F oral Pulse rate:   95 / minute BP sitting:   136 / 93  (right arm) Cuff size:   regular  Vitals Entered By: Tessie Fass CMA (Nov 03, 2009 3:04 PM) CC: gas x 2 weeks Is Patient Diabetic? No Pain Assessment Patient in pain? no        Primary Care Provider:  Helane Rima DO  CC:  gas x 2 weeks.  History of Present Illness: 40 year old AAF:  1. Gas: patient states that she has had increased gas over the past several days, with burping. she denies abdominal pain, N/V/D. she endorses constipation and straining with BM.   2. Anxiety/Depression: Rx: Citalopram. continues regular exercise which she believes is helping her mood as well.   3. Contraceptive Management: was previously on Sprintec. would like to restart for menses regulation and heavy menses. LMP 2022/11/15, lasted 1 week. not sexually active.   Habits & Providers  Alcohol-Tobacco-Diet     Tobacco Status: never  Current Medications (verified): 1)  Omeprazole 20 Mg Cpdr (Omeprazole) .... Take 1 Capsule By Mouth Once A Day 2)  Citalopram Hydrobromide 20 Mg Tabs (Citalopram Hydrobromide) .... One By Mouth Daily, Once Daily 3)  Lisinopril-Hydrochlorothiazide 20-25 Mg  Tabs (Lisinopril-Hydrochlorothiazide) .... Take 1 Tab By Mouth Daily 4)  Zyrtec Allergy 10 Mg Tabs (Cetirizine Hcl) .Marland Kitchen.. 1 Tablet By Mouth Daily For Allergies 5)  Plaquenil 200 Mg Tabs (Hydroxychloroquine Sulfate) .... One By Mouth Two Times A Day 6)  Fluticasone Propionate 50 Mcg/act  Susp (Fluticasone Propionate) .... 2 Sprays Each Nostril Once Daily 7)  Miralax   Powd (Polyethylene Glycol 3350) .Marland Kitchen.. 17g By Mouth Once Daily As Needed Constipation 8)  Sprintec 28 0.25-35 Mg-Mcg Tabs (Norgestimate-Eth Estradiol) .... One By Mouth Daily Per Instructions  Allergies (verified): No Known Drug  Allergies  Past History:  Past medical, surgical, family and social histories (including risk factors) reviewed, and no changes noted (except as noted below).  Past Medical History: Reviewed history from 01/22/2009 and no changes required. 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 Emily Filbert, [facility], [phone] Depression GERD HTN  Past Surgical History: Cystoscopy - WBL - for microscopic hematuria - 12/04/1996 Mirena IUD - 03/2004 - 09/23/2004 C. section  Family History: Reviewed history from 08/26/2008 and no changes required. Father with HTN, brain ca (deceased in 11-15-2002) Mother died at 98 yo of liver failure secondary to EtOH No breast ca in family.  Social History: Reviewed history from 08/26/2008 and no changes required. Separated from husband.  Has son (born 11-14-1989) now lives with his girlfriend.   No tob, etoh, drugs.  Father with end stage brain ca passed 8/04.  Works 40 hours/week at [employer] of the [employer], uses bus for transportation.  Able to read with a magnifying glass.  Review of Systems General:  Denies chills and fever. CV:  Denies chest pain or discomfort, shortness of breath with exertion, and swelling of feet. Resp:  Denies cough and shortness of breath. GI:  Complains of constipation and gas; denies abdominal pain, bloody stools, diarrhea, nausea, and vomiting. GU:  Denies discharge and dysuria. Psych:  Complains of anxiety and depression.  Physical Exam  General:  alert, appropriate dress, and normal appearance. vitals reviewed. Lungs:  CTAB Heart:  RRR no m/r/g Abdomen:  Bowel sounds positive,abdomen soft and non-tender without masses, organomegaly  Pulses:  2+dp Extremities:  no edema Psych:  memory intact for recent and remote, normally interactive, and slightly anxious.     Impression &  Recommendations:  Problem # 1:  CONSTIPATION (ICD-564.00) Assessment New  Her updated medication list for this problem includes:    Miralax Powd (Polyethylene glycol 3350) .Marland KitchenMarland KitchenMarland KitchenMarland Kitchen 17g by mouth once daily as needed constipation  Orders: [facility]- Est  Level 4 (16109)  Problem # 2:  DEPRESSION/ANXIETY (ICD-300.4) Assessment: Unchanged  Continue current treatment.  Orders: TSH-[facility] [id number]) Vit D, 25 OH-[facility] [phone]) [facility]- Est  Level 4 (13086)  Problem # 3:  MENORRHAGIA (ICD-626.2) Assessment: Unchanged  Orders: CBC-[facility] (57846) TSH-[facility] (96295-28413) Vit D, 25 OH-[facility] ([id number]) [facility]- Est  Level 4 (36644)  Her updated medication list for this problem includes:    Sprintec 28 0.25-35 Mg-mcg Tabs (Norgestimate-eth estradiol) ..... One by mouth daily per instructions  Problem # 4:  CONTRACEPTIVE MANAGEMENT (ICD-V25.09) Assessment: Unchanged  Orders: [facility]- Est  Level 4 (03474)  Complete Medication List: 1)  Omeprazole 20 Mg Cpdr (Omeprazole) .... Take 1 capsule by mouth once a day 2)  Citalopram Hydrobromide 20 Mg Tabs (Citalopram hydrobromide) .... One by mouth daily, once daily 3)  Lisinopril-hydrochlorothiazide 20-25 Mg Tabs (Lisinopril-hydrochlorothiazide) .... Take 1 tab by mouth daily 4)  Zyrtec Allergy 10 Mg Tabs (Cetirizine hcl) .Marland Kitchen.. 1 tablet by mouth daily for allergies 5)  Plaquenil 200 Mg Tabs (Hydroxychloroquine sulfate) .... One by mouth two times a day 6)  Fluticasone Propionate 50 Mcg/act Susp (Fluticasone propionate) .... 2 sprays each nostril once daily 7)  Miralax Powd (Polyethylene glycol 3350) .Marland Kitchen.. 17g by mouth once daily as needed constipation 8)  Sprintec 28 0.25-35 Mg-mcg Tabs (Norgestimate-eth estradiol) .... One by mouth daily per instructions  Other Orders: Comp Met-[facility] ([id number])  Patient Instructions: 1)  It was nice to see you today. 2)  Continue exercise and eating a healthy diet.  3)  I am prescribing a birth control pill to regulate  your periods. 4)  Take Miralax daily to keep your bowels moving. Make sure to drink lots of water and eat fruits and vegatables every day. Prescriptions: SPRINTEC 28 0.25-35 MG-MCG TABS (NORGESTIMATE-ETH ESTRADIOL) one by mouth daily per instructions  #3 x 4   Entered and Authorized by:   Helane Rima DO   Signed by:   Helane Rima DO on 11/03/2009   Method used:   [facility]        [facility] [address] [phone]* (retail)       [address]       [place], [place]  [address]       Ph: [phone] or [phone]       Fax: [phone]   RxID:   [id number] MIRALAX   POWD (POLYETHYLENE GLYCOL 3350) 17g by mouth once daily as needed constipation  #1 pack x 3   Entered and Authorized by:   Helane Rima DO   Signed by:   Helane Rima DO on 11/03/2009   Method used:   [facility]        [facility] [address] [phone]* (retail)       [address]  [place], [place]  [address]       Ph: [phone] or [phone]       Fax: [phone]   RxID:   [id number]   Prevention & Chronic Care Immunizations   Influenza vaccine: Fluvax MCR  (03/18/2009)   Influenza vaccine deferral: Not indicated  (11/03/2009)   Influenza vaccine due: 03/07/2009    Tetanus booster: 08/26/2008: given   Tetanus booster due: 08/27/2018    Pneumococcal vaccine: Not documented  Other Screening   Pap smear: NEGATIVE FOR INTRAEPITHELIAL LESIONS OR MALIGNANCY.  (08/26/2008)   Pap smear action/deferral: Deferred-2 yr interval  (11/03/2009)   Pap smear due: 06/2009   Smoking status: never  (11/03/2009)  Lipids   Total Cholesterol: 178  (08/26/2008)   LDL: 72  (08/26/2008)   LDL Direct: Not documented   HDL: 64  (08/26/2008)   Triglycerides: 211  (08/26/2008)  Hypertension   Last Blood Pressure: 136 / 93  (11/03/2009)   Serum creatinine: 0.83  (08/26/2008)   Serum potassium 3.7  (08/26/2008) CMP ordered     Hypertension flowsheet  reviewed?: Yes   Progress toward BP goal: At goal  Self-Management Support :   Personal Goals (by the next clinic visit) :      Personal blood pressure goal: 140/90  (02/10/2009)   Patient will work on the following items until the next clinic visit to reach self-care goals:     Medications and monitoring: take my medicines every day, bring all of my medications to every visit  (11/03/2009)     Eating: drink diet soda or water instead of juice or soda, eat more vegetables, use fresh or frozen vegetables, eat foods that are low in salt, eat baked foods instead of fried foods, eat fruit for snacks and desserts, limit or avoid alcohol  (11/03/2009)     Activity: take a 30 minute walk every day, take the stairs instead of the elevator, park at the far end of the parking lot  (11/03/2009)    Hypertension self-management support: Written self-care plan  (11/03/2009)   Hypertension self-care plan printed.    Hypertension self-management support not done because: Good outcomes  (02/10/2009)

## 2010-07-08 NOTE — Progress Notes (Signed)
Summary: UPDATE PROBLEM LIST   

## 2010-07-08 NOTE — Assessment & Plan Note (Signed)
Summary: implanon insertion/eo   Vital Signs:  Patient profile:   40 year old female Weight:      167.5 pounds Pulse rate:   88 / minute BP sitting:   118 / 73  (right arm)  Vitals Entered By: Arlyss Repress CMA, (April 05, 2010 1:29 PM) CC: implanon insertion. Is Patient Diabetic? No Pain Assessment Patient in pain? no        Primary Care Provider:  Helane Rima DO  CC:  implanon insertion.Marland Kitchen  History of Present Illness: 40 yo F:  1. Implanon: Patient here for Implanon insertion. Upreg negative. Benefits and risks discussed at last visit and again today.  Current Medications (verified): 1)  Omeprazole 20 Mg Cpdr (Omeprazole) .... Take 1 Capsule By Mouth Once A Day 2)  Citalopram Hydrobromide 20 Mg Tabs (Citalopram Hydrobromide) .... One By Mouth Daily, Once Daily 3)  Lisinopril-Hydrochlorothiazide 20-25 Mg  Tabs (Lisinopril-Hydrochlorothiazide) .... Take 1 Tab By Mouth Daily 4)  Zyrtec Allergy 10 Mg Tabs (Cetirizine Hcl) .Marland Kitchen.. 1 Tablet By Mouth Daily For Allergies 5)  Plaquenil 200 Mg Tabs (Hydroxychloroquine Sulfate) .... One By Mouth Two Times A Day 6)  Fluticasone Propionate 50 Mcg/act  Susp (Fluticasone Propionate) .... 2 Sprays Each Nostril Once Daily 7)  Miralax   Powd (Polyethylene Glycol 3350) .Marland Kitchen.. 17g By Mouth Once Daily As Needed Constipation 8)  Wellbutrin Sr 150 Mg Xr12h-Tab (Bupropion Hcl) .... One By Mouth Daily 9)  Hydrocodone-Acetaminophen 5-325 Mg Tabs (Hydrocodone-Acetaminophen) .Marland Kitchen.. 1 By Mouth Up To 4 Times Per Day As Needed For Pain  Allergies (verified): No Known Drug Allergies PMH-FH-SH reviewed for relevance  Physical Exam  General:  Alert, appropriate dress, and normal appearance. Vitals reviewed.   Impression & Recommendations:  Problem # 1:  CONTRACEPTIVE MANAGEMENT (ICD-V25.09) Assessment New Up reg negative. After discussing risks and benefits of Implanon, consent obtain. Time out completed. Non-dominant arm prepped on inner side,  ~ 9  cm from medial epicondyle with betadine and ETOH. Local anesthesia obtained with 1% lidocaine. Implanon checked - no issues. Implanon inserted in usual manner with no complications. No blood loss. Pressure bandage applied. Patient instructed to palpate Implanon rod. Aftercare instructions given.  Orders: U Preg-[facility] (29562)  Insertion implantable contraceptive capsules   (13086)  Complete Medication List: 1)  Omeprazole 20 Mg Cpdr (Omeprazole) .... Take 1 capsule by mouth once a day 2)  Citalopram Hydrobromide 20 Mg Tabs (Citalopram hydrobromide) .... One by mouth daily, once daily 3)  Lisinopril-hydrochlorothiazide 20-25 Mg Tabs (Lisinopril-hydrochlorothiazide) .... Take 1 tab by mouth daily 4)  Zyrtec Allergy 10 Mg Tabs (Cetirizine hcl) .Marland Kitchen.. 1 tablet by mouth daily for allergies 5)  Plaquenil 200 Mg Tabs (Hydroxychloroquine sulfate) .... One by mouth two times a day 6)  Fluticasone Propionate 50 Mcg/act Susp (Fluticasone propionate) .... 2 sprays each nostril once daily 7)  Miralax Powd (Polyethylene glycol 3350) .Marland Kitchen.. 17g by mouth once daily as needed constipation 8)  Wellbutrin Sr 150 Mg Xr12h-tab (Bupropion hcl) .... One by mouth daily 9)  Hydrocodone-acetaminophen 5-325 Mg Tabs (Hydrocodone-acetaminophen) .Marland Kitchen.. 1 by mouth up to 4 times per day as needed for pain  Patient Instructions: 1)  It was nice to see you today!   Orders Added: 1)  U Preg-[facility] [[id number]] 2)   Insertion implantable contraceptive capsules   [11975]    Laboratory Results   Urine Tests  Date/Time Received: April 05, 2010 1:33 PM  Date/Time Reported: April 05, 2010 1:43 PM     Urine HCG: negative  Comments: ...............test performed by......Marland KitchenBonnie A. [place]az[place], MLS (ASCP)cm

## 2010-07-08 NOTE — Letter (Signed)
Summary: Results Letter  [facility]  [address]   [place], [place] [address]   Phone: [phone]  Fax: [phone]    11/09/2009  BETHANN QUALLEY [address] [place], [place]  [address]  Dear Ms. Panos,  I am happy to inform you that the results of your recent labs were all normal with the exception of a low vitamin D.   I recommend that you take a supplement containing 800 to 1000 International Units of vitamin D plus 1200 mg of calcium daily. Ask your pharmacist to help you find this if you have trouble. Calcium and vitamin D are important for maintain healthy bones.   Sincerely,   Helane Rima DO  Appended Document: Results Letter mailed.

## 2010-07-08 NOTE — Assessment & Plan Note (Signed)
Summary: f/u bp/eo   Vital Signs:  Patient profile:   40 year old female Height:      63 inches Weight:      155.31 pounds BMI:     27.61 Temp:     98.4 degrees F oral Pulse rate:   80 / minute Pulse rhythm:   regular BP sitting:   119 / 78  (right arm)  Vitals Entered By: Modesta Messing LPN (February 18, 2009 4:05 PM) CC: BP check.  c/o sore throat on right and earache. Is Patient Diabetic? No Pain Assessment Patient in pain? yes     Location: right ear Intensity: 7 Type: aching Onset of pain  one day   Primary Care Provider:  Helane Rima DO  CC:  BP check.  c/o sore throat on right and earache..  History of Present Illness: 40 yo AAF with:  1. HTN: controlled. Rx: lisinopril, HCTZ. denies CP, SOB, N/V/D, HA, dizziness, lower extremity edema.  2. Depression: improved. Rx: Citalopram. continues regular exercise which she believes is helping her mood as well.   3. Sarcoidosis: Rx: Plaquenil. limited to skin. recent PFTs done for baseline to eval change if lung involvement in the future. results were better than her age sue to regular exercise.  4. Sore throat: patient babysat last night, now with sore throat and slight right ear pain. + seasonal allergies with runny nose. she denies HA, dizziness, congestion, sinus pressure, cough, fever/chills, rash., sick contact.   Habits & Providers  Alcohol-Tobacco-Diet     Alcohol drinks/day: 0     Tobacco Status: never  Current Medications (verified): 1)  Omeprazole 20 Mg Cpdr (Omeprazole) .... Take 1 Capsule By Mouth Once A Day 2)  Citalopram Hydrobromide 20 Mg Tabs (Citalopram Hydrobromide) .... One By Mouth Daily, Once Daily 3)  Sprintec 28 0.25-35 Mg-Mcg Tabs (Norgestimate-Eth Estradiol) .Marland Kitchen.. 1 Tablet By Mouth Daily.  Dispense 3 Packs 4)  Lisinopril-Hydrochlorothiazide 20-25 Mg  Tabs (Lisinopril-Hydrochlorothiazide) .... Take 1 Tab By Mouth Daily 5)  Zyrtec Allergy 10 Mg Tabs (Cetirizine Hcl) .Marland Kitchen.. 1 Tablet By Mouth  Daily For Allergies 6)  Plaquenil 200 Mg Tabs (Hydroxychloroquine Sulfate) .... One By Mouth Two Times A Day 7)  Fluticasone Propionate 50 Mcg/act  Susp (Fluticasone Propionate) .... 2 Sprays Each Nostril Once Daily  Allergies (verified): No Known Drug Allergies  Past History:  Past medical, surgical, family and social histories (including risk factors) reviewed, and no changes noted (except as noted below).  Past Medical History: Reviewed history from 01/22/2009 and no changes required. Benign Essential Tremor Leber`s Optic Neuropathy     - Dx at age 53, resulting in blindness, followed at [facility] Cutaneous Sarcoidosis    - followed by Dr. Emily Filbert, [facility], [phone] Depression GERD HTN  Past Surgical History: cystoscopy - wnl - for microscopic hematuria - 12/04/1996 Mirena IUD- 03/2004 - 09/23/2004 C. section  Family History: Reviewed history from 08/26/2008 and no changes required. father with HTN, brain ca (deceased in Oct 22, 2002) Mother died at 40 yo of liver failure secondary to EtOH No breast ca in family.  Social History: Reviewed history from 08/26/2008 and no changes required. Separated from husband.  Has son 18yo who lives at home with her (born 21-Oct-1989).   No tob, etoh, drugs.  Father with end stage brain ca passed 8/04.  Works 40 hours/week at [employer] of the [employer], uses bus for transportation.  Able to read with a magnifying glass.  Review of Systems  per HPI, otherwise negative  Physical Exam  General:  alert, well-developed, well-nourished, and well-hydrated.  Vital signs reviewed. Head:  normocephalic and atraumatic.   Eyes:  decreased vision at baseline. pupils equal, pupils round, and pupils reactive to light.   Ears:  R ear normal and L ear normal.   Nose:  clear nasal discharge Mouth:  Oropharynx with mucus tracks from PND. Neck:  No deformities, masses, or tenderness noted. Lungs:  Normal respiratory effort, chest  expands symmetrically. Lungs are clear to auscultation, no crackles or wheezes. Heart:  Normal rate and regular rhythm. S1 and S2 normal without gallop, murmur, click, rub or other extra sounds. Pulses:  2+dp Extremities:  no edema Psych:  Oriented X3, memory intact for recent and remote, normally interactive, good eye contact, and not anxious appearing.     Impression & Recommendations:  Problem # 1:  HYPERTENSION, BENIGN SYSTEMIC (ICD-401.1) Assessment Unchanged  Her updated medication list for this problem includes:    Lisinopril-hydrochlorothiazide 20-25 Mg Tabs (Lisinopril-hydrochlorothiazide) .Marland Kitchen... Take 1 tab by mouth daily  Orders: [facility]- Est  Level 4 (91478)  Problem # 2:  DEPRESSION (ICD-311) Assessment: Improved  Her updated medication list for this problem includes:    Citalopram Hydrobromide 20 Mg Tabs (Citalopram hydrobromide) ..... One by mouth daily, once daily  Orders: [facility]- Est  Level 4 (99214)  Problem # 3:  SARCOIDOSIS (ICD-135) Assessment: Unchanged  Orders: [facility]- Est  Level 4 (29562)  Problem # 4:  POSTNASAL DRIP (ICD-784.91) Assessment: New  Rx: daily Zyrtec and Flonase.   Orders: [facility]- Est  Level 4 (13086)  Complete Medication List: 1)  Omeprazole 20 Mg Cpdr (Omeprazole) .... Take 1 capsule by mouth once a day 2)  Citalopram Hydrobromide 20 Mg Tabs (Citalopram hydrobromide) .... One by mouth daily, once daily 3)  Sprintec 28 0.25-35 Mg-mcg Tabs (Norgestimate-eth estradiol) .Marland Kitchen.. 1 tablet by mouth daily.  dispense 3 packs 4)  Lisinopril-hydrochlorothiazide 20-25 Mg Tabs (Lisinopril-hydrochlorothiazide) .... Take 1 tab by mouth daily 5)  Zyrtec Allergy 10 Mg Tabs (Cetirizine hcl) .Marland Kitchen.. 1 tablet by mouth daily for allergies 6)  Plaquenil 200 Mg Tabs (Hydroxychloroquine sulfate) .... One by mouth two times a day 7)  Fluticasone Propionate 50 Mcg/act Susp (Fluticasone propionate) .... 2 sprays each nostril once daily  Patient Instructions: 1)  It was  great to see you today! I'm glad that you are doing so well! 2)  Your sore throat and ear pain are caused by allergies (your runny nose). Try Afrin 1 spray in the right nostril, then wait 10 minutes and spray the Flonase. Do not use the Afrin for more than 3 days. Be aware that this may also increase your blood pressure. If it does, please stop using it.  Prescriptions: FLUTICASONE PROPIONATE 50 MCG/ACT  SUSP (FLUTICASONE PROPIONATE) 2 sprays each nostril once daily  #1 vial x 3   Entered and Authorized by:   Helane Rima MD   Signed by:   Helane Rima MD on 02/18/2009   Method used:   [facility]        [facility] [address] [phone]* (retail)       [address]       [place], [place]  [address]       Ph: [phone] or [phone]       Fax: [phone]   RxID:   [id number]   Prevention & Chronic Care Immunizations   Influenza vaccine: Fluvax Non-MCR  (  03/07/2008)   Influenza vaccine due: 03/07/2009    Tetanus booster: 08/26/2008: given   Tetanus booster due: 08/27/2018    Pneumococcal vaccine: Not documented  Other Screening   Pap smear: NEGATIVE FOR INTRAEPITHELIAL LESIONS OR MALIGNANCY.  (08/26/2008)   Pap smear due: 06/2009   Smoking status: never  (02/18/2009)  Lipids   Total Cholesterol: 178  (08/26/2008)   LDL: 72  (08/26/2008)   LDL Direct: Not documented   HDL: 64  (08/26/2008)   Triglycerides: 211  (08/26/2008)  Hypertension   Last Blood Pressure: 119 / 78  (02/18/2009)   Serum creatinine: 0.83  (08/26/2008)   Serum potassium 3.7  (08/26/2008)    Hypertension flowsheet reviewed?: Yes   Progress toward BP goal: At goal  Self-Management Support :   Personal Goals (by the next clinic visit) :      Personal blood pressure goal: 140/90  (02/10/2009)   Patient will work on the following items until the next clinic visit to reach self-care goals:     Medications and monitoring: take my medicines every day, bring all of my  medications to every visit  (02/18/2009)     Eating: drink diet soda or water instead of juice or soda, use fresh or frozen vegetables, eat foods that are low in salt, eat baked foods instead of fried foods, eat fruit for snacks and desserts  (02/18/2009)     Activity: take a 30 minute walk every day  (02/18/2009)    Hypertension self-management support: Written self-care plan  (02/18/2009)   Hypertension self-care plan printed.    Hypertension self-management support not done because: Good outcomes  (02/10/2009)

## 2010-07-08 NOTE — Progress Notes (Signed)
  Phone Note Call from Patient   Caller: Patient Call For: [phone] Summary of Call: Migraines since Friday.  Need to see someone today Initial call taken by: Abundio Miu,  March 15, 2010 10:08 AM  Follow-up for Phone Call        has taken many OTCs. associated with her periods. no HA today. wi with pcp wed Follow-up by: Golden Circle RN,  March 15, 2010 10:35 AM

## 2010-07-08 NOTE — Miscellaneous (Signed)
Summary: Patient with Sarcoidosis   CXR  Procedure date:  12/22/2008  Findings:      Findings:   Normal heart size, mediastinal contours, pulmonary vascularity.   Lungs clear.   No pleural effusion or pneumothorax.   Bones unremarkable.    IMPRESSION:   No acute abnormalities.  Allergies: No Known Drug Allergies   Complete Medication List: 1)  Omeprazole 20 Mg Cpdr (Omeprazole) .... Take 1 capsule by mouth once a day 2)  Wellbutrin Sr 150 Mg Xr12h-tab (Bupropion hcl) .Marland Kitchen.. 1 tablet by mouth daily 3)  Sprintec 28 0.25-35 Mg-mcg Tabs (Norgestimate-eth estradiol) .Marland Kitchen.. 1 tablet by mouth daily.  dispense 3 packs 4)  Hydrochlorothiazide 25 Mg Tabs (Hydrochlorothiazide) .Marland Kitchen.. 1 tablet by mouth daily for blood pressure 5)  Norvasc 5 Mg Tabs (Amlodipine besylate) .Marland Kitchen.. 1 tablet by mouth daily for blood pressure 6)  Zyrtec Allergy 10 Mg Tabs (Cetirizine hcl) .Marland Kitchen.. 1 tablet by mouth daily for allergies

## 2010-07-08 NOTE — Progress Notes (Signed)
  Phone Note Call from Patient   Caller: Patient Call For: [phone] Summary of Call: Pt calling back to say she's still spotting and think she need to be seen by someone for this.  Please call her back for an appt. Initial call taken by: Abundio Miu,  March 25, 2010 11:50 AM  Follow-up for Phone Call        stateas the flow was heavy this am & she got worried. it is now pink & she is ok now. has appt end of Oct & says this can wait Follow-up by: Golden Circle RN,  March 25, 2010 1:42 PM

## 2010-07-08 NOTE — Progress Notes (Signed)
Summary: triage  Phone Note Call from Patient Call back at [phone]   Caller: Patient Summary of Call: Pt feel nauseated and feels like she has to go to the bathroom, but can't. Initial call taken by: Clydell Hakim,  August 06, 2009 9:46 AM  Follow-up for Phone Call        no answer at above number. tried work number.  she is constipated. told her to get miralax or similar product. mix with water or juice. may take up to 4 times a day. once she is regular again, take the mmiralax daily. she agrred to try this. to call back if feeling worse, pain, fever. she agreed Follow-up by: Golden Circle RN,  August 06, 2009 10:14 AM

## 2010-07-08 NOTE — Progress Notes (Signed)
Summary: meds ques  Phone Note Call from Patient Call back at [phone] x [phone]   Caller: Patient Summary of Call: pt has a question about her BCP Initial call taken by: De Nurse,  January 01, 2010 2:14 PM  Follow-up for Phone Call        wanted to know if she could take them continuously so she will only have a period every 3 months. told her that is not the way the directions read. will forward to pcp to see if this is ok, c/o HA. works in a very [employer]. advised tyenol or going home & resting. she gets off at 4 Follow-up by: Golden Circle RN,  January 01, 2010 2:31 PM  Additional Follow-up for Phone Call Additional follow up Details #1::        for OCP - it is okay to take 3 packs in a row before stopping for a menses. Additional Follow-up by: Helane Rima DO,  January 03, 2010 11:11 AM    Additional Follow-up for Phone Call Additional follow up Details #2::    Pt informed Follow-up by: Golden Circle RN,  January 04, 2010 9:21 AM

## 2010-07-08 NOTE — Assessment & Plan Note (Signed)
Summary: f/u,df   Vital Signs:  Patient profile:   40 year old female Height:      63 inches Weight:      160 pounds BMI:     28.45 Temp:     97.6 degrees F oral Pulse rate:   99 / minute BP sitting:   135 / 86  (right arm) Cuff size:   regular  Vitals Entered By: Tessie Fass CMA (May 13, 2009 3:54 PM) CC: body numbness x today, depression, sarcoid Is Patient Diabetic? No Pain Assessment Patient in pain? no        Primary Care Provider:  Helane Rima DO  CC:  body numbness x today, depression, and sarcoid.  History of Present Illness: 40 year old AAF:  1. Body Numbness: x today. Patient has been under a lot of stress since her son has been having trouble with school, drugs, hanging out with the wrong crowd, has been hospitalized 2/2 vomiting with unknown cause. she endorses knowing that her symptoms are somatic manifestations of her anxiety.   2. Depression: improved. Rx: Citalopram. continues regular exercise which she believes is helping her mood as well.   3. Sarcoidosis: Rx: Plaquenil. limited to skin. recent PFTs done for baseline to eval change if lung involvement in the future. results were better than her age due to regular exercise. she is going to [facility] to see her eye doctor next month. she requests a Rx for plaquenil today while she is switching dermatologists. she would like to go back to [facility] - Dr. Joseph Art.  Habits & Providers  Alcohol-Tobacco-Diet     Tobacco Status: never  Current Medications (verified): 1)  Omeprazole 20 Mg Cpdr (Omeprazole) .... Take 1 Capsule By Mouth Once A Day 2)  Citalopram Hydrobromide 20 Mg Tabs (Citalopram Hydrobromide) .... One By Mouth Daily, Once Daily 3)  Lisinopril-Hydrochlorothiazide 20-25 Mg  Tabs (Lisinopril-Hydrochlorothiazide) .... Take 1 Tab By Mouth Daily 4)  Zyrtec Allergy 10 Mg Tabs (Cetirizine Hcl) .Marland Kitchen.. 1 Tablet By Mouth Daily For Allergies 5)  Plaquenil 200 Mg Tabs (Hydroxychloroquine Sulfate)  .... One By Mouth Two Times A Day 6)  Fluticasone Propionate 50 Mcg/act  Susp (Fluticasone Propionate) .... 2 Sprays Each Nostril Once Daily  Allergies (verified): No Known Drug Allergies  Past History:  Past medical, surgical, family and social histories (including risk factors) reviewed for relevance to current acute and chronic problems.  Past Medical History: Reviewed history from 01/22/2009 and no changes required. 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 Emily [facility], [phone] Depression GERD HTN  Past Surgical History: Reviewed history from 02/18/2009 and no changes required. cystoscopy - wnl - for microscopic hematuria - 12/04/1996 Mirena IUD- 03/2004 - 09/23/2004 C. section  Family History: Reviewed history from 08/26/2008 and no changes required. father with HTN, brain ca (deceased in 11/13/02) Mother died at 41 yo of liver failure secondary to EtOH No breast ca in family.  Social History: Reviewed history from 08/26/2008 and no changes required. Separated from husband.  Has son 18yo who lives at home with her (born 1989-11-12).   No tob, etoh, drugs.  Father with end stage brain ca passed 8/04.  Works 40 hours/week at [employer] of the [employer], uses bus for transportation.  Able to read with a magnifying glass.  Review of Systems  The patient denies weight  loss, weight gain, chest pain, syncope, dyspnea on exertion, peripheral edema, prolonged cough, abdominal pain, and suspicious skin lesions.    Physical Exam  General:  alert, appropriate dress, and normal appearance. vitals reviewed. Lungs:  CTAB Heart:  RRR no m/r/g Neurologic:  alert & oriented X3, cranial nerves II-XII intact, strength normal in all extremities, sensation intact to light touch, and gait normal.   Psych:  memory intact for recent and remote, normally  interactive, and slightly anxious.     Impression & Recommendations:  Problem # 1:  DEPRESSION/ANXIETY (ICD-300.4) Assessment Deteriorated  Discussed her anxiety-provoking issues. Discussed taking care of herself with continued exercise, eating well, and getting enough sleep. No evidence of SI/HI.   Orders: [facility]- Est  Level 4 (40981)  Problem # 2:  SARCOIDOSIS (ICD-135) Assessment: Unchanged  Rx Plaquenil until she has a IT consultant. She will f/u with [facility].  Orders: [facility]- Est  Level 4 (19147)  Complete Medication List: 1)  Omeprazole 20 Mg Cpdr (Omeprazole) .... Take 1 capsule by mouth once a day 2)  Citalopram Hydrobromide 20 Mg Tabs (Citalopram hydrobromide) .... One by mouth daily, once daily 3)  Lisinopril-hydrochlorothiazide 20-25 Mg Tabs (Lisinopril-hydrochlorothiazide) .... Take 1 tab by mouth daily 4)  Zyrtec Allergy 10 Mg Tabs (Cetirizine hcl) .Marland Kitchen.. 1 tablet by mouth daily for allergies 5)  Plaquenil 200 Mg Tabs (Hydroxychloroquine sulfate) .... One by mouth two times a day 6)  Fluticasone Propionate 50 Mcg/act Susp (Fluticasone propionate) .... 2 sprays each nostril once daily  Patient Instructions: 1)  It was nice to see you today. 2)  Take care of yourself. This is a stressful time. Continue exercise and eating a healthy diet.  3)  I will reill your medications and send them to your pharmacy. Prescriptions: FLUTICASONE PROPIONATE 50 MCG/ACT  SUSP (FLUTICASONE PROPIONATE) 2 sprays each nostril once daily  #1 vial x 3   Entered and Authorized by:   Helane Rima DO   Signed by:   Helane Rima DO on 05/14/2009   Method used:   [facility]        [facility] [address] [phone]* (retail)       [address]       [place], [place]  [address]       Ph: [phone] or [phone]       Fax: [phone]   RxID:   [id number] PLAQUENIL 200 MG TABS (HYDROXYCHLOROQUINE SULFATE) one by mouth two times a day  #60 x 3   Entered and  Authorized by:   Helane Rima DO   Signed by:   Helane Rima DO on 05/14/2009   Method used:   Electronically to        [facility] [address] [phone]* (retail)       [address]       [place], [place]  [address]       Ph: [phone] or [phone]       Fax: [phone]   RxID:   [id number] LISINOPRIL-HYDROCHLOROTHIAZIDE 20-25 MG  TABS (LISINOPRIL-HYDROCHLOROTHIAZIDE) Take 1 tab by mouth daily  #30 x 6   Entered and Authorized by:   Helane Rima DO   Signed by:   Helane Rima DO on 05/14/2009   Method used:   Electronically to        [facility] [address] [phone]* (retail)       [address]       [place], [place]  [address]       Ph: [phone] or [phone]       Fax: [phone]   RxID:   [id number] CITALOPRAM HYDROBROMIDE 20 MG TABS (CITALOPRAM HYDROBROMIDE) one by mouth daily, once daily  #30 Tablet x 6   Entered and Authorized by:   Helane Rima DO   Signed by:   Helane Rima DO on 05/14/2009   Method used:   Electronically to        [facility] [address] [phone]* (retail)       [address]       [place], [place]  [address]       Ph: [phone] or [phone]       Fax: [phone]   RxID:   [id number] OMEPRAZOLE 20 MG CPDR (OMEPRAZOLE) Take 1 capsule by mouth once a day  #30 Capsule x 11   Entered and Authorized by:   Helane Rima DO   Signed by:   Helane Rima DO on 05/14/2009   Method used:   Electronically to        [facility] [address] [phone]* (retail)       [address]       [place], [place]  [address]       Ph: [phone] or [phone]       Fax: [phone]   RxID:   [id number]

## 2010-07-08 NOTE — Progress Notes (Signed)
Summary: phn msg  Phone Note Call from Patient Call back at [phone]   Caller: Patient Summary of Call: has questions about her BCP Initial call taken by: De Nurse,  March 23, 2010 10:05 AM  Follow-up for Phone Call        she stopped OCPs & now is spotting. told her that is normal after stopping. she is not sexually active at this time & does not desire birth control. told her she should be back to normal by the end of the 2nd month or perhaps sooner. she was ok & had no further questions Follow-up by: Golden Circle RN,  March 23, 2010 10:20 AM

## 2010-07-08 NOTE — Miscellaneous (Signed)
Summary: Procedures Consent  Procedures Consent   Imported By: De Nurse 04/15/2010 11:08:33  _____________________________________________________________________  External Attachment:    Type:   Image     Comment:   External Document

## 2010-07-08 NOTE — Assessment & Plan Note (Signed)
Summary: bp ck,tcb   Vital Signs:  Patient profile:   40 year old female Height:      63.5 inches Weight:      155.44 pounds BMI:     27.20 Temp:     97.5 degrees F oral Pulse rate:   83 / minute Pulse rhythm:   regular BP sitting:   130 / 79  (right arm)  Vitals Entered By: Modesta Messing LPN (January 22, 2009 8:31 AM) CC: BP, depression, sarcoidosis Is Patient Diabetic? No Pain Assessment Patient in pain? no        Primary Care Provider:  Helane Rima DO  CC:  BP, depression, and sarcoidosis.  History of Present Illness: Allison Steele is a 40 year old with HTN, depression, sarcoidosis presenting for follow-up.  1. HTN: controlled today. review of medication shows that the patient has been taking lisinopril-HCTZ 10-12.5 mg po daily, and HCTZ 25mg  by mouth daily. She has not been taking the Norvasc because it was too expensive. She denies CP, SOB, N/V/D, LE edema, HA, dizziness. She does endorse urinary frequency. She has lost  ~ 14 pounds since her last visit in May through exercise (Zumba, kickboxing, dance).   2. Depression: recent change from Lexapro to Wellbutrin because Lexapro too expensive. She feels more depressed since starting the Wellbutrin. She endorses frequent crying, feelings of sadness. Many of her sad thoughts are based around her new diagnosis of sarcoidosis. She states that she "don't want nobody to look at me." No SI/HI. Going to the gym helps. She lives with her 68 y/o son and feels that he is a help as well.  3. Sarcoid: Dx by Bx. Followed by Vaughan Sine. Rx plaquenil 200 mg by mouth twice daily. CXR negative. Followed by eye doctor as well.  4. Contraception: takes OCPs for 3 months at a time, but having trouble with insurance paying for her medication since she needs the OCPs early.   5. Urinary frequency as above: denies dysuria, abdominal pain, fever/chills, back pain, vaginal discharge.  Habits & Providers  Alcohol-Tobacco-Diet     Tobacco Status:  never  Current Medications (verified): 1)  Omeprazole 20 Mg Cpdr (Omeprazole) .... Take 1 Capsule By Mouth Once A Day 2)  Citalopram Hydrobromide 20 Mg Tabs (Citalopram Hydrobromide) .... One By Mouth Daily, Increase To 2 By Mouth Daily After 1 Week 3)  Sprintec 28 0.25-35 Mg-Mcg Tabs (Norgestimate-Eth Estradiol) .Marland Kitchen.. 1 Tablet By Mouth Daily.  Dispense 3 Packs 4)  Lisinopril-Hydrochlorothiazide 20-25 Mg  Tabs (Lisinopril-Hydrochlorothiazide) .... Take 1 Tab By Mouth Daily 5)  Zyrtec Allergy 10 Mg Tabs (Cetirizine Hcl) .Marland Kitchen.. 1 Tablet By Mouth Daily For Allergies 6)  Plaquenil 200 Mg Tabs (Hydroxychloroquine Sulfate) .... 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 Emily Filbert, [facility], [phone] Depression GERD HTN  Review of Systems       per HPI, otherwise negative  Physical Exam  General:  alert, well-developed, well-nourished, and well-hydrated.  Blind. Vital signs reviewed. Lungs:  Normal respiratory effort, chest expands symmetrically. Lungs are clear to auscultation, no crackles or wheezes. Heart:  Normal rate and regular rhythm. S1 and S2 normal without gallop, murmur, click, rub or other extra sounds. Pulses:  2+dp Extremities:  no edema Psych:  Oriented  X3, memory intact for recent and remote, normally interactive, and flat affect.     Impression & Recommendations:  Problem # 1:  HYPERTENSION, BENIGN SYSTEMIC (ICD-401.1) Assessment Improved  Controlled, but patient taking excess HCTZ. Advised patient to stop separate HCTZ. Rx new Rx for combo med. Follow up BP check in 2 weeks. Will check BMP.  The following medications were removed from the medication list:    Norvasc 5 Mg Tabs (Amlodipine besylate) .Marland Kitchen... 1 tablet by mouth daily  for blood pressure Her updated medication list for this problem includes:    Lisinopril-hydrochlorothiazide 20-25 Mg Tabs (Lisinopril-hydrochlorothiazide) .Marland Kitchen... Take 1 tab by mouth daily  Orders: [facility]- Est  Level 4 (21308)  Problem # 2:  URINARY URGENCY (MVH-846.96) Assessment: New No evidence of UTI. Likely secondary to HCTZ. Orders: Urinalysis-[facility] (00000) [facility]- Est  Level 4 (29528)  Problem # 3:  DEPRESSION (ICD-311) Assessment: Deteriorated  Changed medication to Celexa since it is inexpensive and similar to Lexapro. Encourage patient to continue with exercise. She would also likely benefit from counseling to help her to cope with her illness. Her updated medication list for this problem includes:    Citalopram Hydrobromide 20 Mg Tabs (Citalopram hydrobromide) ..... One by mouth daily, increase to 2 by mouth daily after 1 week  Orders: [facility]- Est  Level 4 (41324)  Problem # 4:  CONTRACEPTIVE MANAGEMENT (ICD-V25.09) Assessment: Unchanged  Gave patient another prescription for her OCP and advised her to buy it at [place] (without using her insurance company) every 3 months when her insurance company will not pay for the Rx.  Orders: [facility]- Est  Level 4 (40102)  Problem # 5:  SARCOIDOSIS (ICD-135) Assessment: New  Followed by Vaughan Sine. Rx. Plaquenil. Followed by Optho. CXR negative. Will get baseline PFTs so that we have them in case she has pulmonary involvement in the future.  Orders: [facility]- Est  Level 4 ([address]36)  Problem # 6:  MICROSCOPIC HEMATURIA (ICD-599.72) Assessment: Unchanged Small amt blood on UA today. Noted to have normal work-up including cystoscopy in 1998 for same problem.  Orders: [facility]- Est  Level 4 (64403)  Complete Medication List: 1)  Omeprazole 20 Mg Cpdr (Omeprazole) .... Take 1 capsule by mouth once a day 2)  Citalopram Hydrobromide 20 Mg Tabs (Citalopram hydrobromide) .... One by mouth daily, increase to 2 by mouth daily after 1 week 3)  Sprintec 28 0.25-35  Mg-mcg Tabs (Norgestimate-eth estradiol) .Marland Kitchen.. 1 tablet by mouth daily.  dispense 3 packs 4)  Lisinopril-hydrochlorothiazide 20-25 Mg Tabs (Lisinopril-hydrochlorothiazide) .... Take 1 tab by mouth daily 5)  Zyrtec Allergy 10 Mg Tabs (Cetirizine hcl) .Marland Kitchen.. 1 tablet by mouth daily for allergies 6)  Plaquenil 200 Mg Tabs (Hydroxychloroquine sulfate) .... One by mouth two times a day  Patient Instructions: 1)  It was very nice to meet you today! 2)  Please schedule a visit in the pharmacy clinic for PULMONARY FUNCTION TESTING. 3)  Follow up in 2 weeks so that we can check you blood pressure and see if the depression medication is working for you. Prescriptions: PLAQUENIL 200 MG TABS (HYDROXYCHLOROQUINE SULFATE) one by mouth two times a day  #0 x 0   Entered and Authorized by:   Helane Rima MD   Signed by:   Helane Rima MD on 01/22/2009   Method used:   Print then Give to Patient   RxID:   [id number] LISINOPRIL-HYDROCHLOROTHIAZIDE 20-25 MG  TABS (LISINOPRIL-HYDROCHLOROTHIAZIDE) Take 1 tab by mouth daily  #90 x 3  Entered and Authorized by:   Helane Rima MD   Signed by:   Helane Rima MD on 01/22/2009   Method used:   Print then Give to Patient   RxID:   [id number] SPRINTEC 28 0.25-35 MG-MCG TABS (NORGESTIMATE-ETH ESTRADIOL) 1 tablet by mouth daily.  dispense 3 packs  #3 x 3   Entered and Authorized by:   Helane Rima MD   Signed by:   Helane Rima MD on 01/22/2009   Method used:   Print then Give to Patient   RxID:   [id number] CITALOPRAM HYDROBROMIDE 20 MG TABS (CITALOPRAM HYDROBROMIDE) one by mouth daily, increase to 2 by mouth daily after 1 week  #60 x 3   Entered and Authorized by:   Helane Rima MD   Signed by:   Helane Rima MD on 01/22/2009   Method used:   Print then Give to Patient   RxID:   [id number] CITALOPRAM HYDROBROMIDE 20 MG TABS (CITALOPRAM HYDROBROMIDE) one by mouth daily, increase to 2 by mouth daily after 1 week  #60 x 3   Entered and  Authorized by:   Helane Rima MD   Signed by:   Helane Rima MD on 01/22/2009   Method used:   Electronically to        [facility] [address] [phone]* (retail)       [address]       [place], [place]  [address]       Ph: [phone] or [phone]       Fax: [phone]   RxID:   [id number] LISINOPRIL-HYDROCHLOROTHIAZIDE 20-25 MG  TABS (LISINOPRIL-HYDROCHLOROTHIAZIDE) Take 1 tab by mouth daily  #90 x 3   Entered and Authorized by:   Helane Rima MD   Signed by:   Helane Rima MD on 01/22/2009   Method used:   Electronically to        [facility] [address] [phone]* (retail)       [address]       [place], [place]  [address]       Ph: [phone] or [phone]       Fax: [phone]   RxID:   [id number]   Laboratory Results   Urine Tests  Date/Time Received: January 22, 2009 9:03 AM  Date/Time Reported: January 22, 2009 9:11 AM   Routine Urinalysis   Color: yellow Appearance: Clear Glucose: negative   (Normal Range: Negative) Bilirubin: negative   (Normal Range: Negative) Ketone: negative   (Normal Range: Negative) Spec. Gravity: 1.015   (Normal Range: 1.003-1.035) Blood: small   (Normal Range: Negative) pH: 7.0   (Normal Range: 5.0-8.0) Protein: negative   (Normal Range: Negative) Urobilinogen: 0.2   (Normal Range: 0-1) Nitrite: negative   (Normal Range: Negative) Leukocyte Esterace: negative   (Normal Range: Negative)  Urine Microscopic WBC/HPF: 0-3 RBC/HPF: 0-2 Bacteria/HPF: 1+ Epithelial/HPF: 0-3    Comments: ...........test performed by...........Marland KitchenTerese Door, CMA

## 2010-07-08 NOTE — Progress Notes (Signed)
Summary: Triage  Phone Note Call from Patient Call back at [phone] ext [phone]   Reason for Call: Talk to Nurse Summary of Call: pt has been on her cycle for 1 1/2 weeks, has implanon, wants to know if this is normal? Initial call taken by: Knox Royalty,  June 25, 2010 11:51 AM  Follow-up for Phone Call        Returned call.  She was unavailable at the time.  Will call her back in about 15 minutes. Follow-up by: Dennison Nancy RN,  June 25, 2010 2:09 PM  Additional Follow-up for Phone Call Additional follow up Details #1::        Pt says to call back on cell phone number [phone] Additional Follow-up by: Abundio Miu,  June 25, 2010 4:01 PM    Additional Follow-up for Phone Call Additional follow up Details #2::    Explained to pt that the introduction of hormones will cause irregular menses patterns.  She did say that the flow was starting to taper.  She will continue to monitor it and will call back next week if she is still bleeding.  Told her I would schedule an appt with her PCP if that does happen. Follow-up by: Dennison Nancy RN,  June 25, 2010 4:09 PM

## 2010-07-08 NOTE — Progress Notes (Signed)
Summary: meds prob  Phone Note Call from Patient Call back at [phone]   Caller: Patient Summary of Call: Allison Steele started taking bp meds again and they are still giving her a headache. pls advise Initial call taken by: De Nurse,  September 17, 2008 4:07 PM  Follow-up for Phone Call        spoke with patient and Allison Steele did develop headache again after taking BP med. will send message to MD to please advise. [facility] Follow-up by: Theresia Lo RN,  September 17, 2008 5:01 PM  Additional Follow-up for Phone Call Additional follow up Details #1::        stop lisonopril-HCTZ combo bill.  Rx sent for HCTZ 25mg  and norvasc 5mg .  Please notify patient of med changes.  Allison Steele needs to f/u in 4 weeks after starting new meds or sooner if any advserse side effects. Additional Follow-up by: Sylvan Cheese MD,  September 18, 2008 1:36 PM    Additional Follow-up for Phone Call Additional follow up Details #2::    patient notified of above message. appointment scheduled for 10/08/2008. Follow-up by: Theresia Lo RN,  September 18, 2008 2:49 PM  New/Updated Medications: HYDROCHLOROTHIAZIDE 25 MG TABS (HYDROCHLOROTHIAZIDE) 1 tablet by mouth daily for blood pressure NORVASC 5 MG TABS (AMLODIPINE BESYLATE) 1 tablet by mouth daily for blood pressure   Prescriptions: NORVASC 5 MG TABS (AMLODIPINE BESYLATE) 1 tablet by mouth daily for blood pressure  #30 x 3   Entered and Authorized by:   Sylvan Cheese MD   Signed by:   Theresia Lo RN on 09/18/2008   Method used:   Electronically to        [facility] [address] [phone]* (retail)       [address]       [place], [place]  [address]       Ph: [phone] or [phone]       Fax: [phone]   RxID:   [id number] HYDROCHLOROTHIAZIDE 25 MG TABS (HYDROCHLOROTHIAZIDE) 1 tablet by mouth daily for blood pressure  #30 x 3   Entered and Authorized by:   Sylvan Cheese MD   Signed by:   Theresia Lo RN on 09/18/2008   Method used:    Electronically to        [facility] [address] [phone]* (retail)       [address]       [place], [place]  [address]       Ph: [phone] or [phone]       Fax: [phone]   RxID:   [id number]

## 2010-07-08 NOTE — Progress Notes (Signed)
Summary: meds prob  Phone Note Call from Patient Call back at [phone]   Caller: Patient Summary of Call: states that she got Bupropion from Dr Earlene Plater back in Dec [facility] Initial call taken by: De Nurse,  December 10, 2009 10:34 AM  Follow-up for Phone Call        states she is on this & the other antidepressant on her med list. this one is not listed. she has been out for a few days & wants it refilled. texted pcp & sent her this message Follow-up by: Golden Circle RN,  December 10, 2009 10:49 AM  Additional Follow-up for Phone Call Additional follow up Details #1::        please call patient and clarify her medication list. if she is on bupropion and not citalopram, please refill. Additional Follow-up by: Helane Rima DO,  December 10, 2009 7:57 PM    Additional Follow-up for Phone Call Additional follow up Details #2::    pt states she is on both meds. the buproprion is not on med list for refills Follow-up by: Golden Circle RN,  December 11, 2009 9:28 AM  Additional Follow-up for Phone Call Additional follow up Details #3:: Details for Additional Follow-up Action Taken: called pharmacy. pt received gen.wellbutrin was rx'd by Ezzard Standing. called in refills x 4 to pharmacy. pharmacist reports, that pt has been taking both meds for a long time. Additional Follow-up by: Arlyss Repress CMA,,  December 11, 2009 12:13 PM  New/Updated Medications: WELLBUTRIN SR 150 MG XR12H-TAB (BUPROPION HCL) one by mouth daily

## 2010-07-08 NOTE — Consult Note (Signed)
Summary: [facility]   Imported By: Clydell Hakim 12/16/2008 16:38:21  _____________________________________________________________________  External Attachment:    Type:   Image     Comment:   External Document

## 2010-07-23 ENCOUNTER — Inpatient Hospital Stay (HOSPITAL_COMMUNITY)
Admission: AD | Admit: 2010-07-23 | Discharge: 2010-07-23 | Disposition: A | Discharge status: Home / Self Care | Payer: MEDICARE | Source: Ambulatory Visit | Attending: Obstetrics & Gynecology | Admitting: Obstetrics & Gynecology

## 2010-07-23 DIAGNOSIS — N938 Other specified abnormal uterine and vaginal bleeding: Secondary | ICD-10-CM | POA: Insufficient documentation

## 2010-07-23 DIAGNOSIS — N949 Unspecified condition associated with female genital organs and menstrual cycle: Secondary | ICD-10-CM

## 2010-07-23 LAB — POCT PREGNANCY, URINE: Preg Test, Ur: NEGATIVE

## 2010-07-23 LAB — CBC
HCT: 37.5 % (ref 36.0–46.0)
Hemoglobin: 12.9 g/dL (ref 12.0–15.0)
MCHC: 34.4 g/dL (ref 30.0–36.0)
MCV: 91.2 fL (ref 78.0–100.0)

## 2010-07-23 LAB — WET PREP, GENITAL

## 2010-07-24 LAB — GC/CHLAMYDIA PROBE AMP, GENITAL: Chlamydia, DNA Probe: NEGATIVE

## 2010-08-13 ENCOUNTER — Ambulatory Visit: Payer: MEDICARE | Admitting: Family Medicine

## 2010-08-30 ENCOUNTER — Ambulatory Visit: Payer: MEDICARE | Admitting: Family Medicine

## 2010-09-21 ENCOUNTER — Ambulatory Visit: Payer: MEDICARE | Admitting: Family Medicine

## 2010-09-21 ENCOUNTER — Encounter: Payer: Self-pay | Admitting: Family Medicine

## 2010-09-21 ENCOUNTER — Ambulatory Visit (INDEPENDENT_AMBULATORY_CARE_PROVIDER_SITE_OTHER): Payer: MEDICARE | Admitting: Family Medicine

## 2010-09-21 VITALS — BP 130/80 | HR 92 | Ht 63.0 in | Wt 176.0 lb

## 2010-09-21 DIAGNOSIS — R635 Abnormal weight gain: Secondary | ICD-10-CM

## 2010-09-21 DIAGNOSIS — F341 Dysthymic disorder: Secondary | ICD-10-CM

## 2010-09-21 LAB — GLUCOSE, CAPILLARY: Glucose-Capillary: 88 mg/dL (ref 70–99)

## 2010-09-21 LAB — GLUCOSE, POCT (MANUAL RESULT ENTRY): POC Glucose: 88

## 2010-09-21 LAB — TSH: TSH: 1.486 u[IU]/mL (ref 0.350–4.500)

## 2010-09-21 NOTE — Patient Instructions (Signed)
It was nice to see you today!  Please call Dr. Pascal Lux to schedule an appointment.  We are checking labs today.

## 2010-09-22 ENCOUNTER — Encounter: Payer: Self-pay | Admitting: Family Medicine

## 2010-09-22 NOTE — Assessment & Plan Note (Signed)
Checking routine labs, but suspect mood is major contributor.

## 2010-09-22 NOTE — Assessment & Plan Note (Signed)
Patient has no therapist at this time. Offered counseling and patient interested. She will call Dr. Pascal Lux.

## 2010-09-22 NOTE — Progress Notes (Signed)
  Subjective:    Patient ID: Allison Steele, female    DOB: 12/20/1970, 40 y.o.   MRN: [id number]  HPI  1. Depression: Patient with Hx of depression, on Celexa and Wellbutrin, endorses increased depression and anxiety over the past several months. Some iof this is due to her son's new Dx of cyclical vomiting and the fact that she feels that she needs to take care of him. He is in college. She was exercising regularly, but finds it hard to get motivated to go. She feels tired all of the time and has gained weight. No SI/HI. No ETOH, tobacco, or drug use. 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 Her medical issues are stable at this time.  Review of Systems SEE HPI.    Objective:   Physical Exam  Constitutional: She appears well-developed and well-nourished. No distress.  Neck: No thyromegaly present.  Cardiovascular: Normal rate, regular rhythm, normal heart sounds and intact distal pulses.   Pulmonary/Chest: Effort normal and breath sounds normal.  Psychiatric:       Flat affect.      Assessment & Plan:

## 2010-12-16 ENCOUNTER — Encounter: Payer: Self-pay | Admitting: Family Medicine

## 2010-12-16 ENCOUNTER — Other Ambulatory Visit: Payer: Self-pay | Admitting: Family Medicine

## 2010-12-16 NOTE — Telephone Encounter (Signed)
Refill request

## 2011-01-21 ENCOUNTER — Other Ambulatory Visit: Payer: Self-pay | Admitting: Family Medicine

## 2011-01-21 MED ORDER — BUPROPION HCL ER (SR) 150 MG PO TB12: 150.0000 mg | ORAL_TABLET | Freq: Once | ORAL | Status: DC

## 2011-01-21 NOTE — Telephone Encounter (Signed)
Refill request

## 2011-03-03 ENCOUNTER — Other Ambulatory Visit: Payer: Self-pay | Admitting: Family Medicine

## 2011-03-03 NOTE — Telephone Encounter (Signed)
Refill request

## 2011-03-07 ENCOUNTER — Ambulatory Visit: Payer: Self-pay | Admitting: Family Medicine

## 2011-03-14 ENCOUNTER — Ambulatory Visit (INDEPENDENT_AMBULATORY_CARE_PROVIDER_SITE_OTHER): Payer: Medicare Other | Admitting: Family Medicine

## 2011-03-14 ENCOUNTER — Encounter: Payer: Self-pay | Admitting: Family Medicine

## 2011-03-14 VITALS — BP 139/87 | HR 80 | Temp 99.0°F | Ht 63.0 in | Wt 174.0 lb

## 2011-03-14 DIAGNOSIS — R229 Localized swelling, mass and lump, unspecified: Secondary | ICD-10-CM

## 2011-03-14 DIAGNOSIS — IMO0001 Reserved for inherently not codable concepts without codable children: Secondary | ICD-10-CM

## 2011-03-14 DIAGNOSIS — Z23 Encounter for immunization: Secondary | ICD-10-CM

## 2011-03-14 DIAGNOSIS — I10 Essential (primary) hypertension: Secondary | ICD-10-CM

## 2011-03-14 NOTE — Progress Notes (Signed)
  Subjective:    Patient ID: Allison Steele, female    DOB: January 09, 1971, 40 y.o.   MRN: [id number]  HPI Concerned about bump on left labia: Bump on left labia x1 month. Patient states she is concerned and wants to make sure that this is not a problem. Also concerned this may be due to sarcoidosis, since she has had problems with skin lesions in past on arms and back of neck. No pain at the site. No ulcer. No changes in the skin. No burning. No vaginal discharge. No vaginal bleeding. No problems with urination. Patient's last sexual encounter 2 months ago. States she always has protected sex. Only has one partner. States she is not at risk for STDs-refuses screening at this time. Has Implanon. Occasional spotting-does not have regular periods on the line.   Patient originally supposed to followup today for depression. Patient states that she would prefer to talk about the area on labia since this area is of concern to her. Will defer  chronic issues to annual physical in one to 2 months when she returns.   Review of Systems     Objective:   Physical Exam  Constitutional: She appears well-developed and well-nourished.  Cardiovascular: Normal rate.   Pulmonary/Chest: Effort normal. No respiratory distress.  Genitourinary: Vagina normal. No labial fusion. There is no rash, tenderness, lesion or injury on the right labia. There is no rash, tenderness, lesion or injury on the left labia. No erythema, tenderness or bleeding around the vagina. No vaginal discharge found.          Assessment & Plan:

## 2011-03-14 NOTE — Patient Instructions (Signed)
Please return for annual wellness exam in 1-2 month. Return sooner if needed.

## 2011-03-16 DIAGNOSIS — IMO0001 Reserved for inherently not codable concepts without codable children: Secondary | ICD-10-CM | POA: Insufficient documentation

## 2011-03-16 NOTE — Assessment & Plan Note (Signed)
Patient to return for recheck of blood pressure and followup on other chronic medical problems. Need to schedule annual exam in 2-4 weeks.

## 2011-03-16 NOTE — Assessment & Plan Note (Signed)
Patient perceives bump/thickened area in left labia. No pain, no red flags. Unable to appreciate on exam. May be skin changes secondary to sarcoidosis. We'll follow clinically. Patient to return if new or worsening symptoms.

## 2011-04-11 ENCOUNTER — Encounter: Payer: Medicare Other | Admitting: Family Medicine

## 2011-04-19 ENCOUNTER — Other Ambulatory Visit: Payer: Self-pay | Admitting: Family Medicine

## 2011-04-19 NOTE — Telephone Encounter (Signed)
Refill request

## 2011-04-21 ENCOUNTER — Other Ambulatory Visit: Payer: Self-pay | Admitting: Family Medicine

## 2011-04-21 NOTE — Telephone Encounter (Signed)
Refill request

## 2011-06-14 ENCOUNTER — Other Ambulatory Visit: Payer: Self-pay | Admitting: Family Medicine

## 2011-06-14 NOTE — Telephone Encounter (Signed)
Refill request

## 2011-06-16 ENCOUNTER — Other Ambulatory Visit: Payer: Self-pay | Admitting: Family Medicine

## 2011-06-16 NOTE — Telephone Encounter (Signed)
Refill request

## 2011-07-17 ENCOUNTER — Other Ambulatory Visit: Payer: Self-pay | Admitting: Family Medicine

## 2011-07-17 NOTE — Telephone Encounter (Signed)
Refill request

## 2011-07-18 ENCOUNTER — Other Ambulatory Visit: Payer: Self-pay | Admitting: Family Medicine

## 2011-07-18 ENCOUNTER — Telehealth: Payer: Self-pay | Admitting: *Deleted

## 2011-07-18 ENCOUNTER — Telehealth: Payer: Self-pay | Admitting: Family Medicine

## 2011-07-18 DIAGNOSIS — I10 Essential (primary) hypertension: Secondary | ICD-10-CM

## 2011-07-18 MED ORDER — LISINOPRIL-HYDROCHLOROTHIAZIDE 20-25 MG PO TABS: 1.0000 | ORAL_TABLET | Freq: Every day | ORAL | Status: DC

## 2011-07-18 NOTE — Telephone Encounter (Signed)
Refill request

## 2011-07-18 NOTE — Telephone Encounter (Signed)
Has an appt on 2/26 but needs enough Lisinopril to last until that appt [facility] [address].

## 2011-07-18 NOTE — Telephone Encounter (Signed)
Filled Lisinopril, pt has appt 08-02-11. Fwd. To Dr.Caviness for info. Lorenda Hatchet, Renato Battles

## 2011-08-02 ENCOUNTER — Encounter: Payer: Self-pay | Admitting: Family Medicine

## 2011-08-02 ENCOUNTER — Ambulatory Visit (INDEPENDENT_AMBULATORY_CARE_PROVIDER_SITE_OTHER): Payer: Medicare Other | Admitting: Family Medicine

## 2011-08-02 VITALS — BP 131/85 | HR 99 | Ht 64.0 in | Wt 164.0 lb

## 2011-08-02 DIAGNOSIS — I10 Essential (primary) hypertension: Secondary | ICD-10-CM

## 2011-08-02 DIAGNOSIS — E663 Overweight: Secondary | ICD-10-CM

## 2011-08-02 DIAGNOSIS — F341 Dysthymic disorder: Secondary | ICD-10-CM

## 2011-08-02 LAB — BASIC METABOLIC PANEL
Calcium: 10 mg/dL (ref 8.4–10.5)
Sodium: 140 mEq/L (ref 135–145)

## 2011-08-02 MED ORDER — LISINOPRIL-HYDROCHLOROTHIAZIDE 20-25 MG PO TABS: 1.0000 | ORAL_TABLET | Freq: Every day | ORAL | Status: DC

## 2011-08-02 NOTE — Assessment & Plan Note (Addendum)
Bmet today to assess kidney function and electrolytes.  bp 131/85 today.  Refilled lisinopril-HCTZ.  Pt needs to schedule annual exam within the next few weeks.

## 2011-08-02 NOTE — Progress Notes (Signed)
  Subjective:    Patient ID: Allison Steele, female    DOB: July 11, 1970, 41 y.o.   MRN: [id number]  HPI Allison Steele is a 41 yo F with PMHx of sarcoidosis and htn who presents to PCP for bp monitor and med refill.    Blood pressure: She reports that she checks her bp at home and it is usually around 120/80.  She denies CP, palpitations, SOB, edema.  Denies HA, insomnia  Weight loss: She states that she joined [employer] in October and has lost 10 lbs.  She reports that she is eating more fruits and vegetables and going to the gym at least 3 times every week.  Depression screen: Pt reports that her mood has been fine.  She says that she sleeps well at night and does not typically experience depressive symptoms.  Review of Systems     Objective:   Physical Exam  Constitutional: She appears well-developed and well-nourished. No distress.  Neck: Normal range of motion. Neck supple.  Cardiovascular: Normal rate, regular rhythm and normal heart sounds.  Exam reveals no gallop and no friction rub.   No murmur heard. Pulmonary/Chest: Effort normal and breath sounds normal. No respiratory distress. She has no wheezes. She has no rales. She exhibits no tenderness.  Skin: Skin is warm and dry.          Assessment & Plan:

## 2011-08-02 NOTE — Assessment & Plan Note (Signed)
Patient reports mood as "fine." Continue wellbutrin and celexa at current doses.

## 2011-08-07 NOTE — Assessment & Plan Note (Addendum)
Encouraged pt to continue weight loss efforts. Pt to return in 1 month for annual exam.  Will recheck weight at that time.

## 2011-09-19 ENCOUNTER — Telehealth: Payer: Self-pay | Admitting: Family Medicine

## 2011-09-19 NOTE — Telephone Encounter (Signed)
Patient is calling because she was here in February for her physical and was given an Rx for Lisinopril at that time, with 4 refills, which should mean that she should have enough medication through May at least, but her pharmacy is telling her that she has no more refills.  She would like to speak to the Triage nurse about this.

## 2011-09-19 NOTE — Telephone Encounter (Signed)
Called pharmacy and rx is there. They will fill for patient now. Patient notified.

## 2011-10-06 ENCOUNTER — Other Ambulatory Visit: Payer: Self-pay | Admitting: Family Medicine

## 2011-12-22 ENCOUNTER — Other Ambulatory Visit: Payer: Self-pay | Admitting: Family Medicine

## 2012-01-05 ENCOUNTER — Other Ambulatory Visit: Payer: Self-pay | Admitting: Family Medicine

## 2012-02-03 ENCOUNTER — Other Ambulatory Visit: Payer: Self-pay | Admitting: *Deleted

## 2012-02-03 MED ORDER — OMEPRAZOLE 20 MG PO CPDR: 20.0000 mg | DELAYED_RELEASE_CAPSULE | Freq: Every day | ORAL | Status: DC

## 2012-02-28 ENCOUNTER — Other Ambulatory Visit (HOSPITAL_COMMUNITY)
Admission: RE | Admit: 2012-02-28 | Discharge: 2012-02-28 | Disposition: A | Discharge status: Home / Self Care | Payer: Medicare Other | Source: Ambulatory Visit | Attending: Family Medicine | Admitting: Family Medicine

## 2012-02-28 ENCOUNTER — Encounter: Payer: Self-pay | Admitting: Family Medicine

## 2012-02-28 ENCOUNTER — Ambulatory Visit (INDEPENDENT_AMBULATORY_CARE_PROVIDER_SITE_OTHER): Payer: Medicare Other | Admitting: Family Medicine

## 2012-02-28 VITALS — BP 130/88 | HR 102 | Ht 64.0 in | Wt 171.0 lb

## 2012-02-28 DIAGNOSIS — Z124 Encounter for screening for malignant neoplasm of cervix: Secondary | ICD-10-CM

## 2012-02-28 DIAGNOSIS — H548 Legal blindness, as defined in USA: Secondary | ICD-10-CM

## 2012-02-28 DIAGNOSIS — J309 Allergic rhinitis, unspecified: Secondary | ICD-10-CM

## 2012-02-28 DIAGNOSIS — I10 Essential (primary) hypertension: Secondary | ICD-10-CM

## 2012-02-28 DIAGNOSIS — Z01419 Encounter for gynecological examination (general) (routine) without abnormal findings: Secondary | ICD-10-CM | POA: Insufficient documentation

## 2012-02-28 DIAGNOSIS — F341 Dysthymic disorder: Secondary | ICD-10-CM

## 2012-02-28 DIAGNOSIS — E663 Overweight: Secondary | ICD-10-CM

## 2012-02-28 DIAGNOSIS — Z23 Encounter for immunization: Secondary | ICD-10-CM

## 2012-02-28 MED ORDER — FLUTICASONE PROPIONATE 50 MCG/ACT NA SUSP: 2.0000 | Freq: Every day | NASAL | Status: DC

## 2012-02-28 MED ORDER — CETIRIZINE HCL 10 MG PO TABS: 10.0000 mg | ORAL_TABLET | Freq: Every day | ORAL | Status: DC

## 2012-02-28 NOTE — Patient Instructions (Signed)
Dear Mrs. Lady,   Thank you for coming to clinic today. Please read below regarding the issues that we discussed.   1. Wright Loss - I always encourage 30 minutes of exercise,  5 days a week. The best exercise is the one that you can do repeatedly.   2. Pap Smear - I will let you know the results.   Please follow up in clinic in 6 months. Please call earlier if you have any questions or concerns.   Sincerely,   Dr. Clinton Sawyer

## 2012-02-28 NOTE — Assessment & Plan Note (Signed)
This appears well-controlled at this point. Continue Wellbutrin and continue Celexa.

## 2012-02-28 NOTE — Assessment & Plan Note (Signed)
Wt Readings from Last 5 Encounters:  02/28/12 171 lb (77.565 kg)  08/02/11 164 lb (74.39 kg)  03/14/11 174 lb (78.926 kg)  09/21/10 176 lb (79.833 kg)  04/05/10 167 lb 8 oz (75.978 kg)    Patient counseled extensively about weight loss strategy. She is knowledgeable and motivated. She will continue to exercise multiple times a week and focus on lower fat foods. She also notes that her goal is to run a 5K with her sister in March.

## 2012-02-28 NOTE — Progress Notes (Signed)
  Subjective:    Patient ID: Allison Steele, female    DOB: June 08, 1970, 41 y.o.   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   Seasonal Allergies: She that she currently has a minor frontal headache and "watery" eyes which she attributes to the seasons changing. She has been prescribed cetirizine, but uses it infrequently. She needs a another prescription for cetirizine.   Overweight: the patient was previously living weight through [employer] and noted that her weight was approximately 160 pounds. She exercises multiple times a week and enjoys Zumba and weight lifting. She thinks that her weight gain is attributable to her diet more than her physical activity. She notes that her favorite food is cheese.  Depression: the patient denies current depression. She does not however that she was temporarily laid off for 2 months at [employer]. However she has since returned to work full time. She states that she is much more satisfied and is nondepressed. She takes bupropion and citalopram daily.  Hypertension: patient takes lisinopril and hydrochlorothiazide daily. She denies chest pain, shortness of breath, peripheral edema, and unilateral weakness.  Past medical history: no recent hospitalizations Past surgical history: no recent surgeries  Social: Full time employee at [employer]; has one son who is 68 years old. Unmarried. Not sexually active. No alcohol, drugs, or tobacco use.    Review of Systems  All other systems reviewed and are negative.       Objective:   Physical Exam BP 130/88  Pulse 102  Ht 5\' 4"  (1.626 m)  Wt 171 lb (77.565 kg)  BMI 29.35 kg/m2  LMP 02/03/2012 Gen.: middle-aged [place] American female, alert and oriented x4, no apparent distress, non-ill-appearing, very pleasant and conversant HEENT:  normocephalic atraumatic, pupils equal round reactive to light, arcus senilis bilaterally, OP clear and moist, poor dentition, thyroid normal without enlargement or nodules Cardiovascular: regular rate and rhythm number worse, rubs, or gallops; 2+ peripheral pulses Pulmonary: clear to auscultation bilaterally, normal work of breathing Breast: equal bilaterally, no nodules or skin changes noted Abdomen: Soft nondistended nontender normoactive bowel sounds Genitourinary: external genitalia without lesions; internal genitalia without discharge or bleeding, cervix nonfriable, no cervical motion tenderness, no adnexal tenderness, no ovarian masses noted Extremity: no tenderness or edema    Assessment & Plan:  41 year old female with hypertension, depression, in partial blindness who presented for an annual screening physical.

## 2012-02-28 NOTE — Assessment & Plan Note (Signed)
BP Readings from Last 3 Encounters:  02/28/12 130/88  08/02/11 131/85  03/14/11 139/87   Continue lisinopril-HCTZ.

## 2012-03-01 ENCOUNTER — Encounter: Payer: Self-pay | Admitting: Family Medicine

## 2012-03-07 ENCOUNTER — Other Ambulatory Visit: Payer: Self-pay | Admitting: *Deleted

## 2012-03-07 MED ORDER — OMEPRAZOLE 20 MG PO CPDR: 20.0000 mg | DELAYED_RELEASE_CAPSULE | Freq: Every day | ORAL | Status: DC

## 2012-08-21 ENCOUNTER — Ambulatory Visit
Admission: RE | Admit: 2012-08-21 | Discharge: 2012-08-21 | Disposition: A | Discharge status: Home / Self Care | Payer: Medicare Other | Source: Ambulatory Visit | Attending: Dermatology | Admitting: Dermatology

## 2012-08-21 ENCOUNTER — Other Ambulatory Visit: Payer: Self-pay | Admitting: Dermatology

## 2012-08-21 DIAGNOSIS — D869 Sarcoidosis, unspecified: Secondary | ICD-10-CM

## 2012-08-21 DIAGNOSIS — Z79899 Other long term (current) drug therapy: Secondary | ICD-10-CM

## 2012-08-24 ENCOUNTER — Other Ambulatory Visit: Payer: Self-pay | Admitting: Family Medicine

## 2012-08-27 ENCOUNTER — Telehealth: Payer: Self-pay | Admitting: Family Medicine

## 2012-08-27 MED ORDER — LISINOPRIL-HYDROCHLOROTHIAZIDE 20-25 MG PO TABS: 1.0000 | ORAL_TABLET | Freq: Every day | ORAL | Status: DC

## 2012-08-27 MED ORDER — OMEPRAZOLE 20 MG PO CPDR: 20.0000 mg | DELAYED_RELEASE_CAPSULE | Freq: Every day | ORAL | Status: DC

## 2012-08-27 NOTE — Telephone Encounter (Signed)
Patient has scheduled appt for 4/11 which is the soonest available and she is asking for refills on Lisinopril and Omeprazole to last until that appt.  [facility] [address] and she is out of both of these meds.

## 2012-08-27 NOTE — Telephone Encounter (Signed)
Done. .Allison Steele  

## 2012-09-14 ENCOUNTER — Encounter: Payer: Self-pay | Admitting: Family Medicine

## 2012-09-14 ENCOUNTER — Ambulatory Visit (INDEPENDENT_AMBULATORY_CARE_PROVIDER_SITE_OTHER): Payer: Medicare Other | Admitting: Family Medicine

## 2012-09-14 VITALS — BP 146/83 | HR 84 | Ht 63.0 in | Wt 173.9 lb

## 2012-09-14 DIAGNOSIS — J309 Allergic rhinitis, unspecified: Secondary | ICD-10-CM

## 2012-09-14 MED ORDER — OMEPRAZOLE 20 MG PO CPDR: 20.0000 mg | DELAYED_RELEASE_CAPSULE | Freq: Every day | ORAL | Status: DC

## 2012-09-14 MED ORDER — BUPROPION HCL ER (SR) 150 MG PO TB12: ORAL_TABLET | ORAL | Status: DC

## 2012-09-14 MED ORDER — CETIRIZINE HCL 10 MG PO TABS: 10.0000 mg | ORAL_TABLET | Freq: Every day | ORAL | Status: DC

## 2012-09-14 MED ORDER — CITALOPRAM HYDROBROMIDE 20 MG PO TABS: ORAL_TABLET | ORAL | Status: DC

## 2012-09-14 MED ORDER — LISINOPRIL-HYDROCHLOROTHIAZIDE 20-25 MG PO TABS: 1.0000 | ORAL_TABLET | Freq: Every day | ORAL | Status: DC

## 2012-09-14 NOTE — Patient Instructions (Signed)
Please return when ou are ready to have the Implanon taken out. We can start on birth control pills at that time.   Take Care,   Dr. Clinton Sawyer

## 2012-09-14 NOTE — Progress Notes (Signed)
  Subjective:    Patient ID: Allison Steele, female    DOB: 09-22-1970, 42 y.o.   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   1. Hypertension  Home BP monitoring:  BP Readings from Last 3 Encounters:  09/14/12 146/83  02/28/12 130/88  08/02/11 131/85    Prescribed meds: lisinopril-HCTZ 20-25  Hypertension ROS: taking medications as instructed, no medication side effects noted, no TIA's, no chest pain on exertion, no dyspnea on exertion and no swelling of ankles  2. Menstrual Bleeding - Patient is concerned about menstruation. She had Implanon placed October 2011 with the hope that is would cause her to have amenorrhea. However, she still have light/regular bleeding each month, and it is very difficult for her to know when it will begin or end. She says that given her visual deficits, this has caused her to soil her clothes. She has previously been on oral contraception and could tell the difference in the pills to know when she would bleed. She would like to have the Implanon removed.    Review of Systems Denies depression     Objective:   Physical Exam  BP 146/83  Pulse 84  Ht 5\' 3"  (1.6 m)  Wt 173 lb 14.4 oz (78.881 kg)  BMI 30.81 kg/m2  LMP 09/13/2012 Gen: well appearing, non distressed, pleasant and conversant CV: RRR, no mumurs Lungs: CTA-B Extremities: no edema     Assessment & Plan:  Continue current BP regimen and have her return for removal of Implanon. Refills provided for bupropion, citalopram, lisinopril-HCTZ, cetirizine, and omeprazole.

## 2013-02-07 ENCOUNTER — Ambulatory Visit (INDEPENDENT_AMBULATORY_CARE_PROVIDER_SITE_OTHER): Payer: Medicare Other | Admitting: Family Medicine

## 2013-02-07 ENCOUNTER — Encounter: Payer: Self-pay | Admitting: Family Medicine

## 2013-02-07 VITALS — BP 137/86 | HR 85 | Temp 99.3°F | Wt 174.0 lb

## 2013-02-07 DIAGNOSIS — Z3046 Encounter for surveillance of implantable subdermal contraceptive: Secondary | ICD-10-CM

## 2013-02-07 DIAGNOSIS — Z309 Encounter for contraceptive management, unspecified: Secondary | ICD-10-CM | POA: Insufficient documentation

## 2013-02-07 MED ORDER — DROSPIRENONE-ETHINYL ESTRADIOL 3-0.03 MG PO TABS: 1.0000 | ORAL_TABLET | Freq: Every day | ORAL | Status: DC

## 2013-02-07 NOTE — Patient Instructions (Signed)
Thank you for coming in, today! I have sent in a prescription for Yasmin to your pharmacy. You may start your contraception pills when your cycle stops. You may remove the dressing later tonight or tomorrow morning. Keep the area dry and clean with regular soap and water. You can take Tylenol or ibuprofen for any pain around the cut. If you develop any fever/chills, nausea, vomiting, or swelling, bleeding, worse pain, or redness around the cut, come back to clinic. You can come back to see Dr. Clinton Sawyer in 1-2 weeks, or earlier if needed. Please feel free to call with any questions or concerns at any time, at [phone]. --Dr. Casper Harrison

## 2013-02-07 NOTE — Progress Notes (Signed)
Patient ID: GRIZEL VESELY, female   DOB: 12/05/1970, 42 y.o.   MRN: [id number]  Chief Complaint:  Nexplanon removal Reason for removal: desire to start OCP's for more regular/predictable cycles Secondary Complaints: Pt is legally blind and states she often soils clothes with unpredictable bleeding; pt has used Yasmin in the past without difficulty and desires to start this. She is currently not sexually active and does not desire pregnancy at this time. She generally well without complaints.  Removal Approved By: Maryjean Ka, MD Supervisor; Nicolette Bang   The patient's LEFT arm was palpated and the implant device located. The area was prepped with Betadine. The distal end of the device was palpated and 2 cc of 1% lidocaine with epinephrine was injected. A 5 mm incision was made. Any fibrotic tissue was carefully dissected away using blunt and sharp dissection. The device was removed in an intact manner. Dermabond and a sterile dressing were applied to the incision. The patient tolerated the procedure well.  New contraceptive method:  OCP's (Yasmin) to start once pt's current cycle is completed   [facility] Attending Note: Janit Pagan, MD I  have seen and examined this patient, reviewed their chart. I have discussed this patient with the resident. I agree with the resident's findings, assessment and care plan.

## 2013-02-11 ENCOUNTER — Telehealth: Payer: Self-pay | Admitting: Family Medicine

## 2013-02-11 DIAGNOSIS — Z309 Encounter for contraceptive management, unspecified: Secondary | ICD-10-CM

## 2013-02-11 MED ORDER — NORETHIN ACE-ETH ESTRAD-FE 1-20 MG-MCG PO TABS: 1.0000 | ORAL_TABLET | Freq: Every day | ORAL | Status: DC

## 2013-02-11 NOTE — Telephone Encounter (Signed)
Pt was seen last week and received a prescription for [facility], when she went to pick them up they are 40.00 and she can not afford this and would like something else cheaper called in JW

## 2013-02-11 NOTE — Telephone Encounter (Signed)
Discussed with pt's pharmacy and sent in Rx for a different type of OCP (Loestrin instead of Yasmin) which should be cheaper. Uncertain how much cheaper this will be, if at all. Would recommend to pt to come into to talk to her PCP (Dr. Clinton Sawyer) if she has further issues with affording medications, etc -- she had her Nexplanon removed and wanted to go back to OCP's in order to have some more predictability to bleeding/cycles, so she and Dr. Clinton Sawyer could maybe come to some other conclusion for how best to proceed if either she can't afford OCP's easily or if she desires to use another method of birth control. Thanks! --CMS

## 2013-02-11 NOTE — Telephone Encounter (Signed)
FWD to Dr. Casper Harrison who did prescribed this medication to the patient

## 2013-02-12 NOTE — Telephone Encounter (Signed)
LVM for patient to call back to inform of below 

## 2013-05-11 ENCOUNTER — Other Ambulatory Visit (HOSPITAL_COMMUNITY)
Admission: RE | Admit: 2013-05-11 | Discharge: 2013-05-11 | Disposition: A | Discharge status: Home / Self Care | Payer: Medicare Other | Source: Ambulatory Visit | Attending: Emergency Medicine | Admitting: Emergency Medicine

## 2013-05-11 ENCOUNTER — Encounter (HOSPITAL_COMMUNITY): Payer: Self-pay | Admitting: Emergency Medicine

## 2013-05-11 ENCOUNTER — Emergency Department (INDEPENDENT_AMBULATORY_CARE_PROVIDER_SITE_OTHER): Payer: Medicare Other

## 2013-05-11 ENCOUNTER — Emergency Department (HOSPITAL_COMMUNITY)
Admission: EM | Admit: 2013-05-11 | Discharge: 2013-05-11 | Disposition: A | Discharge status: Home / Self Care | Payer: Medicare Other | Source: Home / Self Care | Attending: Emergency Medicine | Admitting: Emergency Medicine

## 2013-05-11 DIAGNOSIS — N760 Acute vaginitis: Secondary | ICD-10-CM | POA: Insufficient documentation

## 2013-05-11 DIAGNOSIS — R319 Hematuria, unspecified: Secondary | ICD-10-CM

## 2013-05-11 DIAGNOSIS — Z113 Encounter for screening for infections with a predominantly sexual mode of transmission: Secondary | ICD-10-CM | POA: Insufficient documentation

## 2013-05-11 DIAGNOSIS — R350 Frequency of micturition: Secondary | ICD-10-CM

## 2013-05-11 LAB — COMPREHENSIVE METABOLIC PANEL
ALT: 14 U/L (ref 0–35)
Albumin: 3.9 g/dL (ref 3.5–5.2)
Alkaline Phosphatase: 42 U/L (ref 39–117)
BUN: 13 mg/dL (ref 6–23)
Chloride: 99 mEq/L (ref 96–112)
Creatinine, Ser: 0.78 mg/dL (ref 0.50–1.10)
GFR calc Af Amer: >90 mL/min (ref >90)
Potassium: 3.2 mEq/L — ABNORMAL LOW (ref 3.5–5.1)
Total Bilirubin: 0.5 mg/dL (ref 0.3–1.2)

## 2013-05-11 LAB — POCT I-STAT, CHEM 8
Calcium, Ion: 1.28 mmol/L — ABNORMAL HIGH (ref 1.12–1.23)
Creatinine, Ser: 1 mg/dL (ref 0.50–1.10)
HCT: 47 % — ABNORMAL HIGH (ref 36.0–46.0)
Hemoglobin: 16 g/dL — ABNORMAL HIGH (ref 12.0–15.0)
Potassium: 3.5 mEq/L (ref 3.5–5.1)
Sodium: 141 mEq/L (ref 135–145)

## 2013-05-11 LAB — CBC WITH DIFFERENTIAL/PLATELET
Basophils Absolute: 0 10*3/uL (ref 0.0–0.1)
Basophils Relative: 1 % (ref 0–1)
Eosinophils Absolute: 0 10*3/uL (ref 0.0–0.7)
Eosinophils Relative: 1 % (ref 0–5)
HCT: 42.6 % (ref 36.0–46.0)
Hemoglobin: 14.9 g/dL (ref 12.0–15.0)
MCH: 33 pg (ref 26.0–34.0)
MCHC: 35 g/dL (ref 30.0–36.0)
Monocytes Absolute: 0.4 10*3/uL (ref 0.1–1.0)
Monocytes Relative: 9 % (ref 3–12)
Neutro Abs: 2 10*3/uL (ref 1.7–7.7)
Platelets: 323 10*3/uL (ref 150–400)

## 2013-05-11 LAB — POCT URINALYSIS DIP (DEVICE)
Bilirubin Urine: NEGATIVE
Ketones, ur: NEGATIVE mg/dL
Leukocytes, UA: NEGATIVE
Protein, ur: NEGATIVE mg/dL
pH: 6 (ref 5.0–8.0)

## 2013-05-11 MED ORDER — CEPHALEXIN 500 MG PO CAPS: 1000.0000 mg | ORAL_CAPSULE | Freq: Two times a day (BID) | ORAL | Status: DC

## 2013-05-11 MED ORDER — PHENAZOPYRIDINE HCL 200 MG PO TABS: 200.0000 mg | ORAL_TABLET | Freq: Three times a day (TID) | ORAL | Status: DC

## 2013-05-11 NOTE — ED Notes (Signed)
Pt c/o poss UTI onset yest Sxs include: urinary freq/urgency, back pain, dark urine color Denies: dysuria, hematuria, f/v/n/d, gyn sxs Alert w/no signs of acute distress.

## 2013-05-11 NOTE — ED Provider Notes (Signed)
Medical screening examination/treatment/procedure(s) were performed by non-physician practitioner and as supervising physician I was immediately available for consultation/collaboration.  Leslee Home, M.D.  Reuben Likes, MD 05/11/13 2045

## 2013-05-11 NOTE — ED Provider Notes (Signed)
CSN: [id number]     Arrival date & time 05/11/13  1324 History   First MD Initiated Contact with Patient 05/11/13 [phone]     Chief Complaint  Patient presents with  . Urinary Tract Infection   (Consider location/radiation/quality/duration/timing/severity/associated sxs/prior Treatment) HPI Comments: 42 year old female presents complaining of possible urinary tract infection. Since yesterday, she has frequent urination with small amounts of urine each time, also this morning she woke up with left-sided back and flank pain. She has taken aspirin for this which has helped. She has a history of kidney stones on her left side but that was much more painful than what she is experiencing right now. She denies fever, chills, NVD, abdominal pain, lightheadedness or dizziness. She describes a urine as being dark yellow, where it would normally be clear. She has no history of diabetes or blood sugar issues. She has no history of liver problems.  Last pap was this year and normal   Patient is a 42 y.o. female presenting with urinary tract infection.  Urinary Tract Infection Pertinent negatives include no chest pain, no abdominal pain and no shortness of breath.    Past Medical History  Diagnosis Date  . Sarcoidosis      - followed by Dr. Emily Filbert, [facility], [phone]  . 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  . Depression   . Hypertension   . GERD (gastroesophageal reflux disease)   . Constipation   . Implanon in place 04/05/10  . 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 Loss of central vision bilaterally.      Past Surgical History  Procedure Laterality Date  . Cesarean section     Family History  Problem Relation Age of Onset  . Cancer Mother   . Hypertension Mother   . Alcohol abuse Father    History  Substance Use Topics  . Smoking status: Never  Smoker   . Smokeless tobacco: Never Used  . Alcohol Use: No   OB History   Grav Para Term Preterm Abortions TAB SAB Ect Mult Living                 Review of Systems  Constitutional: Negative for fever and chills.  Eyes: Negative for visual disturbance.  Respiratory: Negative for cough and shortness of breath.   Cardiovascular: Negative for chest pain, palpitations and leg swelling.  Gastrointestinal: Negative for nausea, vomiting and abdominal pain.  Endocrine: Negative for polydipsia and polyuria.  Genitourinary: Positive for urgency, frequency and decreased urine volume. Negative for dysuria.  Musculoskeletal: Positive for back pain. Negative for arthralgias and myalgias.  Skin: Negative for rash.  Neurological: Negative for dizziness, weakness and light-headedness.    Allergies  Review of patient's allergies indicates no known allergies.  Home Medications   Current Outpatient Rx  Name  Route  Sig  Dispense  Refill  . hydroxychloroquine (PLAQUENIL) 200 MG tablet      TAKE 1 TABLET BY MOUTH TWICE A DAY   60 tablet   0   . lisinopril-hydrochlorothiazide (PRINZIDE,ZESTORETIC) 20-25 MG per tablet   Oral   Take 1 tablet by mouth daily.   30 tablet   11   . omeprazole (PRILOSEC) 20 MG capsule   Oral   Take 1 capsule (20 mg total) by mouth daily.   30 capsule   11   . buPROPion [facility]  SR) 150 MG 12 hr tablet      TAKE 1 TABLET BY MOUTH EVERY DAY   30 tablet   11   . cephALEXin (KEFLEX) 500 MG capsule   Oral   Take 2 capsules (1,000 mg total) by mouth 2 (two) times daily.   28 capsule   0   . cetirizine (ZYRTEC) 10 MG tablet   Oral   Take 1 tablet (10 mg total) by mouth daily.   30 tablet   11   . citalopram (CELEXA) 20 MG tablet      TAKE 1 TABLET BY MOUTH EVERY DAY   30 tablet   11   . clobetasol cream (TEMOVATE) 0.05 %               . norethindrone-ethinyl estradiol (JUNEL FE,GILDESS FE,LOESTRIN FE) 1-20 MG-MCG tablet   Oral   Take 1  tablet by mouth daily.   1 Package   11   . phenazopyridine (PYRIDIUM) 200 MG tablet   Oral   Take 1 tablet (200 mg total) by mouth 3 (three) times daily.   6 tablet   0   . polyethylene glycol (GLYCOLAX) packet   Oral   Take 17 g by mouth daily as needed. Add to 8 ounces fluid & take as needed for constipation           BP 152/94  Pulse 99  Temp(Src) 98.9 F (37.2 C) (Oral)  Resp 16  SpO2 98%  LMP 05/04/2013 Physical Exam  Nursing note and vitals reviewed. Constitutional: She is oriented to person, place, and time. Vital signs are normal. She appears well-developed and well-nourished. No distress.  HENT:  Head: Normocephalic and atraumatic.  Brown sublingual discoloration  Eyes: Pupils are equal, round, and reactive to light. Scleral icterus (minimal, bilateral) is present.  Cardiovascular: Normal rate, regular rhythm and normal heart sounds.  Exam reveals no gallop and no friction rub.   No murmur heard. Pulmonary/Chest: Effort normal and breath sounds normal. No respiratory distress. She has no wheezes. She has no rales.  Abdominal: Soft. Normal appearance. There is tenderness (Minimal) in the left upper quadrant. There is CVA tenderness (left). There is no rigidity, no rebound, no guarding, no tenderness at McBurney's point and negative Murphy's sign.  Genitourinary: Cervix exhibits no motion tenderness, no discharge and no friability. Vaginal discharge (thin, white) found.  Multiple firm cervical growths/polyps  Neurological: She is alert and oriented to person, place, and time. She has normal strength. Coordination normal.  Skin: Skin is warm and dry. No rash noted. She is not diaphoretic.  Psychiatric: She has a normal mood and affect. Judgment normal.    ED Course  Procedures (including critical care time) Labs Review Labs Reviewed  COMPREHENSIVE METABOLIC PANEL - Abnormal; Notable for the following:    Potassium 3.2 (*)    Glucose, Bld 104 (*)    All other  components within normal limits  POCT URINALYSIS DIP (DEVICE) - Abnormal; Notable for the following:    Hgb urine dipstick SMALL (*)    All other components within normal limits  POCT I-STAT, CHEM 8 - Abnormal; Notable for the following:    Glucose, Bld 112 (*)    Calcium, Ion 1.28 (*)    Hemoglobin 16.0 (*)    HCT 47.0 (*)    All other components within normal limits  URINE CULTURE  CBC WITH DIFFERENTIAL  POCT PREGNANCY, URINE  CERVICOVAGINAL ANCILLARY ONLY   Imaging Review Dg Abd 1  View  05/11/2013   CLINICAL DATA:  Upper abdominal pain.  Urinary tract infection.  EXAM: ABDOMEN - 1 VIEW  COMPARISON:  None.  FINDINGS: The bowel gas pattern is normal. Osseous structures are normal. No visible free air or free fluid on the supine radiograph. There are few tiny phleboliths in the pelvis. No visible renal calcifications.  IMPRESSION: Benign appearing abdomen.   Electronically Signed   By: Geanie Cooley M.D.   On: 05/11/2013 10:16      MDM   1. Urinary frequency   2. Hematuria    Treating for urinary tract infection. Culture sent. Urine may be negative due to the frequency of urination. Awaiting results of CMP. I will call her if this is abnormal, otherwise she will followup if she is not improving. She'll also followup with her primary care physician would OB/GYN with regard to the cervical abnormalities.  No significant lab abnormalities.  F/U PRN.    Meds ordered this encounter  Medications  . cephALEXin (KEFLEX) 500 MG capsule    Sig: Take 2 capsules (1,000 mg total) by mouth 2 (two) times daily.    Dispense:  28 capsule    Refill:  0    Order Specific Question:  Supervising Provider    Answer:  Lorenz Coaster, DAVID C [id number]  . phenazopyridine (PYRIDIUM) 200 MG tablet    Sig: Take 1 tablet (200 mg total) by mouth 3 (three) times daily.    Dispense:  6 tablet    Refill:  0    Order Specific Question:  Supervising Provider    Answer:  Lorenz Coaster, DAVID C [[id number]]       Graylon Good, PA-C 05/11/13 1343

## 2013-05-12 LAB — URINE CULTURE: Colony Count: NO GROWTH

## 2013-05-14 ENCOUNTER — Telehealth (HOSPITAL_COMMUNITY): Payer: Self-pay | Admitting: *Deleted

## 2013-05-14 NOTE — ED Notes (Signed)
Pt. called back and told she could stop her Keflex because her urine culture was neg. (as directed by the PA.).  Pt. Instructed to follow- up with her PCP if her symptoms recur or come back here.  Pt. said she has an appt. on 12/24 with  her PCP.

## 2013-05-29 ENCOUNTER — Ambulatory Visit (INDEPENDENT_AMBULATORY_CARE_PROVIDER_SITE_OTHER): Payer: Medicare Other | Admitting: Family Medicine

## 2013-05-29 ENCOUNTER — Encounter: Payer: Self-pay | Admitting: Family Medicine

## 2013-05-29 VITALS — BP 126/77 | HR 81 | Temp 98.3°F | Ht 63.0 in | Wt 173.0 lb

## 2013-05-29 DIAGNOSIS — Z23 Encounter for immunization: Secondary | ICD-10-CM

## 2013-05-29 DIAGNOSIS — F329 Major depressive disorder, single episode, unspecified: Secondary | ICD-10-CM

## 2013-05-29 DIAGNOSIS — Z309 Encounter for contraceptive management, unspecified: Secondary | ICD-10-CM

## 2013-05-29 DIAGNOSIS — N760 Acute vaginitis: Secondary | ICD-10-CM

## 2013-05-29 MED ORDER — BUPROPION HCL ER (SR) 150 MG PO TB12: ORAL_TABLET | ORAL | Status: DC

## 2013-05-29 MED ORDER — NORETHIN ACE-ETH ESTRAD-FE 1-20 MG-MCG PO TABS: 1.0000 | ORAL_TABLET | Freq: Every day | ORAL | Status: DC

## 2013-05-29 MED ORDER — FLUCONAZOLE 150 MG PO TABS: 150.0000 mg | ORAL_TABLET | Freq: Once | ORAL | Status: DC

## 2013-05-29 NOTE — Progress Notes (Signed)
   Subjective:    Patient ID: Jani Files, female    DOB: 10-10-1970, 42 y.o.   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   Depression - Pt with hstory of depression taking bupropion 150 mg q 12 hr. She feels like it is well controlled. She has been a little sad about the holidays, but feels better that her son will be around tomorrow. She no longer takes celexa and has not taken this for several months due to it making her "groggy" in the mornings.   Vaginitis - Pt with itching after completing course of keflex for a UTI 4 days ago. Mild discharge. Symptoms consistent with previous yeast infection. No recent sexual encounter. No missed periods. Denies fever, chills, nausea, vomiting, abdominal pain, and flank pain.   Review of Systems See HPI    Objective:   Physical Exam BP 126/77  Pulse 81  Temp(Src) 98.3 F (36.8 C) (Oral)  Ht 5\' 3"  (1.6 m)  Wt 173 lb (78.472 kg)  BMI 30.65 kg/m2  LMP 05/04/2013 Gen: well appearing female, non distressed, overweight CV: RRR, no murmurs Pulm: CTA-B GYN: deferred Psych: normal affect, thought content and speech pattern, no evidence of depression       Assessment & Plan:  A/P: 42 year old F with well controlled depression, so continue Wellbutrin. Given diflucan for presumed yeast vaginitis after antibiotics. Will return if symptoms do not improve.

## 2013-05-29 NOTE — Patient Instructions (Signed)
It was great to see you. Have a [place] Christmas and come back in 6 months!   Sincerely,   Dr. Clinton Sawyer

## 2013-08-31 ENCOUNTER — Other Ambulatory Visit: Payer: Self-pay | Admitting: Family Medicine

## 2013-09-22 ENCOUNTER — Other Ambulatory Visit: Payer: Self-pay | Admitting: Family Medicine

## 2013-12-25 ENCOUNTER — Emergency Department (HOSPITAL_COMMUNITY)
Admission: EM | Admit: 2013-12-25 | Discharge: 2013-12-25 | Disposition: A | Discharge status: Home / Self Care | Payer: Medicare Other | Attending: Emergency Medicine | Admitting: Emergency Medicine

## 2013-12-25 ENCOUNTER — Encounter (HOSPITAL_COMMUNITY): Payer: Self-pay | Admitting: Emergency Medicine

## 2013-12-25 DIAGNOSIS — K219 Gastro-esophageal reflux disease without esophagitis: Secondary | ICD-10-CM | POA: Diagnosis not present

## 2013-12-25 DIAGNOSIS — Z791 Long term (current) use of non-steroidal anti-inflammatories (NSAID): Secondary | ICD-10-CM | POA: Insufficient documentation

## 2013-12-25 DIAGNOSIS — F329 Major depressive disorder, single episode, unspecified: Secondary | ICD-10-CM | POA: Insufficient documentation

## 2013-12-25 DIAGNOSIS — Z8619 Personal history of other infectious and parasitic diseases: Secondary | ICD-10-CM | POA: Insufficient documentation

## 2013-12-25 DIAGNOSIS — M26609 Unspecified temporomandibular joint disorder, unspecified side: Secondary | ICD-10-CM | POA: Insufficient documentation

## 2013-12-25 DIAGNOSIS — F3289 Other specified depressive episodes: Secondary | ICD-10-CM | POA: Diagnosis not present

## 2013-12-25 DIAGNOSIS — H548 Legal blindness, as defined in USA: Secondary | ICD-10-CM | POA: Insufficient documentation

## 2013-12-25 DIAGNOSIS — H6120 Impacted cerumen, unspecified ear: Secondary | ICD-10-CM | POA: Diagnosis not present

## 2013-12-25 DIAGNOSIS — Z975 Presence of (intrauterine) contraceptive device: Secondary | ICD-10-CM | POA: Diagnosis not present

## 2013-12-25 DIAGNOSIS — M2669 Other specified disorders of temporomandibular joint: Secondary | ICD-10-CM

## 2013-12-25 DIAGNOSIS — H6122 Impacted cerumen, left ear: Secondary | ICD-10-CM

## 2013-12-25 DIAGNOSIS — M26649 Arthritis of unspecified temporomandibular joint: Secondary | ICD-10-CM

## 2013-12-25 DIAGNOSIS — H9209 Otalgia, unspecified ear: Secondary | ICD-10-CM | POA: Diagnosis present

## 2013-12-25 DIAGNOSIS — J029 Acute pharyngitis, unspecified: Secondary | ICD-10-CM | POA: Insufficient documentation

## 2013-12-25 DIAGNOSIS — Z79899 Other long term (current) drug therapy: Secondary | ICD-10-CM | POA: Insufficient documentation

## 2013-12-25 DIAGNOSIS — K5900 Constipation, unspecified: Secondary | ICD-10-CM | POA: Diagnosis not present

## 2013-12-25 MED ORDER — NAPROXEN 500 MG PO TABS: 500.0000 mg | ORAL_TABLET | Freq: Two times a day (BID) | ORAL | Status: DC

## 2013-12-25 NOTE — Discharge Instructions (Signed)
Cerumen Impaction °A cerumen impaction is when the wax in your ear forms a plug. This plug usually causes reduced hearing. Sometimes it also causes an earache or dizziness. Removing a cerumen impaction can be difficult and painful. The wax sticks to the ear canal. The canal is sensitive and bleeds easily. If you try to remove a heavy wax buildup with a cotton tipped swab, you may push it in further. °Irrigation with water, suction, and small ear curettes may be used to clear out the wax. If the impaction is fixed to the skin in the ear canal, ear drops may be needed for a few days to loosen the wax. People who build up a lot of wax frequently can use ear wax removal products available in your local drugstore. °SEEK MEDICAL CARE IF:  °You develop an earache, increased hearing loss, or marked dizziness. °Document Released: 06/30/2004 Document Revised: 08/15/2011 Document Reviewed: 08/20/2009 °ExitCare® Patient Information ©2015 [employer]. This information is not intended to replace advice given to you by your health care provider. Make sure you discuss any questions you have with your health care provider. ° °

## 2013-12-25 NOTE — ED Notes (Signed)
Patient states she started having L ear pain on Monday.  Patient states the pain has gotten progressively worse as the week goes on.

## 2013-12-25 NOTE — ED Notes (Signed)
Irrigation per PA order attempted. Pt tolerated well but ear is still impacted. Notified PA to report to room.

## 2013-12-25 NOTE — ED Provider Notes (Signed)
CSN: [id number]     Arrival date & time 12/25/13  1448 History   This chart was scribed for Starlyn Skeans, PA-C working with Orlie Dakin, MD by Randa Evens, ED Scribe. This patient was seen in room TR06C/TR06C and the patient's care was started at 4:08 PM.    Chief Complaint  Patient presents with  . Otalgia   Patient is a 43 y.o. female presenting with ear pain. The history is provided by the patient. No language interpreter was used.  Otalgia Associated symptoms: sore throat   Associated symptoms: no congestion, no ear discharge, no hearing loss and no rhinorrhea    HPI Comments: Allison Steele is a 43 y.o. female who presents to the Emergency Department complaining of progressively worsening left sided ear pain onset 3 days prior. She states than when she swallows she notices that her throat is sore. She states she has taken BC powder and Advil with no relief. She states the pain is shooting down towards her throat. She states that burping increases her pain. She denies any recent sick contacts. She denies numbness, hearing loss, ear discharge, dental problem, trouble swallowing, congestion, rhinorrhea, facial swelling, SOB, or visual disturbance.   Past Medical History  Diagnosis Date  . Sarcoidosis      - followed by Dr. Delman Cheadle, [facility], [phone]  . Legally blind     Leber`s Optic Neuropathy - Dx at age 67, resulting in blindness, followed at [facility]  . Depression   . Hypertension   . GERD (gastroesophageal reflux disease)   . Constipation   . Implanon in place 04/05/10  . 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 Loss of central vision bilaterally.      Past Surgical History  Procedure Laterality Date  . Cesarean section     Family History  Problem Relation Age of Onset  . Cancer Mother   . Hypertension Mother   . Alcohol abuse Father    History  Substance Use Topics  .  Smoking status: Never Smoker   . Smokeless tobacco: Never Used  . Alcohol Use: No   OB History   Grav Para Term Preterm Abortions TAB SAB Ect Mult Living                 Review of Systems  HENT: Positive for ear pain and sore throat. Negative for congestion, dental problem, ear discharge, facial swelling, hearing loss, rhinorrhea and trouble swallowing.   Eyes: Negative for visual disturbance.  Respiratory: Negative for shortness of breath.   Neurological: Negative for numbness.   Allergies  Review of patient's allergies indicates no known allergies.  Home Medications   Prior to Admission medications   Medication Sig Start Date End Date Taking? Authorizing Provider  buPROPion [facility] SR) 150 MG 12 hr tablet TAKE 1 TABLET BY MOUTH EVERY DAY 05/29/13   Angelica Ran, MD  cetirizine (ZYRTEC) 10 MG tablet Take 1 tablet (10 mg total) by mouth daily. 09/14/12   Angelica Ran, MD  clobetasol cream (TEMOVATE) 0.05 %  02/20/12   Historical Provider, MD  fluconazole (DIFLUCAN) 150 MG tablet Take 1 tablet (150 mg total) by mouth once. 05/29/13   Angelica Ran, MD  hydroxychloroquine (PLAQUENIL) 200 MG tablet TAKE 1 TABLET BY MOUTH TWICE A DAY 07/18/11   Alveda Reasons, MD  lisinopril-hydrochlorothiazide (PRINZIDE,ZESTORETIC) 20-25 MG per tablet TAKE 1 TABLET BY MOUTH DAILY.    Dewain Penning  V, MD  naproxen (NAPROSYN) 500 MG tablet Take 1 tablet (500 mg total) by mouth 2 (two) times daily. 12/25/13   Norva Bowe A Forcucci, PA-C  norethindrone-ethinyl estradiol (JUNEL FE,GILDESS FE,LOESTRIN FE) 1-20 MG-MCG tablet Take 1 tablet by mouth daily. 05/29/13   Angelica Ran, MD  omeprazole (PRILOSEC) 20 MG capsule TAKE 1 CAPSULE (20 MG TOTAL) BY MOUTH DAILY.    Angelica Ran, MD  phenazopyridine (PYRIDIUM) 200 MG tablet Take 1 tablet (200 mg total) by mouth 3 (three) times daily. 05/11/13   Freeman Caldron Baker, PA-C  polyethylene glycol [facility] packet Take 17 g by mouth  daily as needed. Add to 8 ounces fluid & take as needed for constipation     Historical Provider, MD   Triage Vitals: BP 125/79  Pulse 89  Temp(Src) 98.9 F (37.2 C) (Oral)  Resp 18  Ht 5\' 4"  (1.626 m)  Wt 175 lb (79.379 kg)  BMI 30.02 kg/m2  SpO2 100%  LMP 12/06/2013  Physical Exam  Nursing note and vitals reviewed. Constitutional: She is oriented to person, place, and time. She appears well-developed and well-nourished. No distress.  HENT:  Head: Normocephalic and atraumatic.  Right Ear: Hearing, tympanic membrane, external ear and ear canal normal.  Left Ear: Hearing normal. There is tenderness. No drainage or swelling. No decreased hearing is noted.  Nose: Mucosal edema present.  Mouth/Throat: Uvula is midline and oropharynx is clear and moist. No oral lesions. No trismus in the jaw. Normal dentition. No dental abscesses or dental caries.  crepitus of opening and closing of joint over the TMJ, cerumen impaction of left ear.   Eyes: Conjunctivae and EOM are normal.  Neck: Normal range of motion. Neck supple. No JVD present. No thyromegaly present.  Cardiovascular: Normal rate, regular rhythm, normal heart sounds and intact distal pulses.  Exam reveals no gallop and no friction rub.   No murmur heard. Pulmonary/Chest: Effort normal and breath sounds normal. No respiratory distress. She has no wheezes. She has no rales. She exhibits no tenderness.  Musculoskeletal: Normal range of motion.  Lymphadenopathy:    She has no cervical adenopathy.  Neurological: She is alert and oriented to person, place, and time. No cranial nerve deficit.  Skin: Skin is warm and dry.  Psychiatric: She has a normal mood and affect. Her behavior is normal. Judgment and thought content normal.    ED Course  Procedures (including critical care time) DIAGNOSTIC STUDIES: Oxygen Saturation is 100% on RA, normal by my interpretation.    COORDINATION OF CARE:    Labs Review Labs Reviewed - No data  to display  Imaging Review No results found.   EKG Interpretation None      MDM   Final diagnoses:  Cerumen impaction, left  TMJ arthritis   Patient is a 43 y.o. Female who presents to the ED with left ear pain and fullness.  On physical exam patient has cerumen impaction of the left ear.  We have tried irriagation and scooping the ear here with some removal of cerumen but it appears to be very deeply wedged into the ear.  There is also some crepitus with opening and closing of the jaw.  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  I have told the patient to use peroxide drops in her ears to help with the wax impaction.  There is some mild irritation and bleeding after the irrigation and scooping so I have told her to wait for 1-2 days before restarting the  drops.  Patient is to follow-up with ENT and PCP if there is no improvement in her symptoms.  I have given her naproxen BID AC for pain relief.  Patient states understanding to the above at this time.    I personally performed the services described in this documentation, which was scribed in my presence. The recorded information has been reviewed and is accurate.      Cherylann Parr, PA-C 12/25/13 1836

## 2013-12-26 NOTE — ED Provider Notes (Signed)
Medical screening examination/treatment/procedure(s) were performed by non-physician practitioner and as supervising physician I was immediately available for consultation/collaboration.   EKG Interpretation None       Orlie Dakin, MD 12/26/13 [phone]

## 2014-01-10 ENCOUNTER — Ambulatory Visit: Payer: Medicare Other | Admitting: Family Medicine

## 2014-02-13 ENCOUNTER — Other Ambulatory Visit: Payer: Self-pay | Admitting: *Deleted

## 2014-02-17 ENCOUNTER — Other Ambulatory Visit: Payer: Self-pay | Admitting: *Deleted

## 2014-02-17 NOTE — Telephone Encounter (Signed)
Pat called and needs a refill on her acid reflux medication called in. jw

## 2014-02-18 ENCOUNTER — Other Ambulatory Visit: Payer: Self-pay | Admitting: *Deleted

## 2014-02-18 MED ORDER — OMEPRAZOLE 20 MG PO CPDR: DELAYED_RELEASE_CAPSULE | ORAL | Status: DC

## 2014-02-20 ENCOUNTER — Ambulatory Visit (INDEPENDENT_AMBULATORY_CARE_PROVIDER_SITE_OTHER): Payer: Medicare Other | Admitting: Family Medicine

## 2014-02-20 ENCOUNTER — Encounter: Payer: Self-pay | Admitting: Family Medicine

## 2014-02-20 VITALS — BP 147/85 | HR 89 | Temp 97.9°F | Ht 64.0 in | Wt 177.9 lb

## 2014-02-20 DIAGNOSIS — R202 Paresthesia of skin: Secondary | ICD-10-CM

## 2014-02-20 DIAGNOSIS — F341 Dysthymic disorder: Secondary | ICD-10-CM

## 2014-02-20 DIAGNOSIS — F32A Depression, unspecified: Secondary | ICD-10-CM

## 2014-02-20 DIAGNOSIS — R3589 Other polyuria: Secondary | ICD-10-CM

## 2014-02-20 DIAGNOSIS — Z23 Encounter for immunization: Secondary | ICD-10-CM

## 2014-02-20 DIAGNOSIS — R200 Anesthesia of skin: Secondary | ICD-10-CM

## 2014-02-20 DIAGNOSIS — F329 Major depressive disorder, single episode, unspecified: Secondary | ICD-10-CM

## 2014-02-20 DIAGNOSIS — K219 Gastro-esophageal reflux disease without esophagitis: Secondary | ICD-10-CM

## 2014-02-20 DIAGNOSIS — F3289 Other specified depressive episodes: Secondary | ICD-10-CM

## 2014-02-20 DIAGNOSIS — R358 Other polyuria: Secondary | ICD-10-CM

## 2014-02-20 DIAGNOSIS — R209 Unspecified disturbances of skin sensation: Secondary | ICD-10-CM

## 2014-02-20 LAB — POCT UA - MICROSCOPIC ONLY: RBC, urine, microscopic: >20

## 2014-02-20 LAB — POCT URINALYSIS DIPSTICK
BILIRUBIN UA: NEGATIVE
Glucose, UA: NEGATIVE
Ketones, UA: NEGATIVE
Leukocytes, UA: NEGATIVE
Nitrite, UA: NEGATIVE
PH UA: 6.5
PROTEIN UA: NEGATIVE
Spec Grav, UA: <=1.005
Urobilinogen, UA: 0.2

## 2014-02-20 MED ORDER — OMEPRAZOLE 20 MG PO CPDR: DELAYED_RELEASE_CAPSULE | ORAL | Status: DC

## 2014-02-20 MED ORDER — BUPROPION HCL ER (SR) 150 MG PO TB12: 150.0000 mg | ORAL_TABLET | Freq: Two times a day (BID) | ORAL | Status: DC

## 2014-02-20 NOTE — Patient Instructions (Signed)
Thank you for coming in,   I increased your buproprion and let me know how well it is going.   The brace is a cock-up splint. This can be bought at any pharmacy.    Please feel free to call with any questions or concerns at any time, at [phone]. --Dr. Raeford Razor  Carpal Tunnel Syndrome The carpal tunnel is a narrow area located on the palm side of your wrist. The tunnel is formed by the wrist bones and ligaments. Nerves, blood vessels, and tendons pass through the carpal tunnel. Repeated wrist motion or certain diseases may cause swelling within the tunnel. This swelling pinches the main nerve in the wrist (median nerve) and causes the painful hand and arm condition called carpal tunnel syndrome. CAUSES   Repeated wrist motions.  Wrist injuries.  Certain diseases like arthritis, diabetes, alcoholism, hyperthyroidism, and kidney failure.  Obesity.  Pregnancy. SYMPTOMS   A "pins and needles" feeling in your fingers or hand, especially in your thumb, index and middle fingers.  Tingling or numbness in your fingers or hand.  An aching feeling in your entire arm, especially when your wrist and elbow are bent for long periods of time.  Wrist pain that goes up your arm to your shoulder.  Pain that goes down into your palm or fingers.  A weak feeling in your hands. DIAGNOSIS  Your health care provider will take your history and perform a physical exam. An electromyography test may be needed. This test measures electrical signals sent out by your nerves into the muscles. The electrical signals are usually slowed by carpal tunnel syndrome. You may also need X-rays. TREATMENT  Carpal tunnel syndrome may clear up by itself. Your health care provider may recommend a wrist splint or medicine such as a nonsteroidal anti-inflammatory medicine. Cortisone injections may help. Sometimes, surgery may be needed to free the pinched nerve.  HOME CARE INSTRUCTIONS   Take all medicine as directed by  your health care provider. Only take over-the-counter or prescription medicines for pain, discomfort, or fever as directed by your health care provider.  If you were given a splint to keep your wrist from bending, wear it as directed. It is important to wear the splint at night. Wear the splint for as long as you have pain or numbness in your hand, arm, or wrist. This may take 1 to 2 months.  Rest your wrist from any activity that may be causing your pain. If your symptoms are work-related, you may need to talk to your employer about changing to a job that does not require using your wrist.  Put ice on your wrist after long periods of wrist activity.  Put ice in a plastic bag.  Place a towel between your skin and the bag.  Leave the ice on for 15-20 minutes, 03-04 times a day.  Keep all follow-up visits as directed by your health care provider. This includes any orthopedic referrals, physical therapy, and rehabilitation. Any delay in getting necessary care could result in a delay or failure of your condition to heal. SEEK IMMEDIATE MEDICAL CARE IF:   You have new, unexplained symptoms.  Your symptoms get worse and are not helped or controlled with medicines. MAKE SURE YOU:   Understand these instructions.  Will watch your condition.  Will get help right away if you are not doing well or get worse. Document Released: 05/20/2000 Document Revised: 10/07/2013 Document Reviewed: 04/08/2011 [facility] Patient Information 2015 [place], [place]. This information is not intended to  replace advice given to you by your health care provider. Make sure you discuss any questions you have with your health care provider.  

## 2014-02-20 NOTE — Progress Notes (Signed)
   Subjective:    Patient ID: Allison Steele, female    DOB: 1971/04/08, 43 y.o.   MRN: [id number]  HPI  Allison Steele is here for frequent urination.   The past couple of days. Worse at night.  No burning. No fevers, chills, sweats. No vaginal dc.  Has had this happen before and reports having an infeciton.  Right hand numbness occurs at night. The whole hand.  Gets better when she moves it around. Sharp pain. Happens the nights that she works.   Depression: feels more depressed. Stresses in life such as bills, her vision also contributes to her depression. She doesn't feel that her current dose of bupropion is working. .    Current Outpatient Prescriptions on File Prior to Visit  Medication Sig Dispense Refill  . buPROPion (WELLBUTRIN SR) 150 MG 12 hr tablet TAKE 1 TABLET BY MOUTH EVERY DAY  30 tablet  11  . cetirizine (ZYRTEC) 10 MG tablet Take 1 tablet (10 mg total) by mouth daily.  30 tablet  11  . clobetasol cream (TEMOVATE) 0.05 %       . fluconazole (DIFLUCAN) 150 MG tablet Take 1 tablet (150 mg total) by mouth once.  1 tablet  0  . hydroxychloroquine (PLAQUENIL) 200 MG tablet TAKE 1 TABLET BY MOUTH TWICE A DAY  60 tablet  0  . lisinopril-hydrochlorothiazide (PRINZIDE,ZESTORETIC) 20-25 MG per tablet TAKE 1 TABLET BY MOUTH DAILY.  30 tablet  5  . naproxen (NAPROSYN) 500 MG tablet Take 1 tablet (500 mg total) by mouth 2 (two) times daily.  30 tablet  0  . norethindrone-ethinyl estradiol (JUNEL FE,GILDESS FE,LOESTRIN FE) 1-20 MG-MCG tablet Take 1 tablet by mouth daily.  1 Package  11  . omeprazole (PRILOSEC) 20 MG capsule TAKE 1 CAPSULE (20 MG TOTAL) BY MOUTH DAILY.  30 capsule  5  . phenazopyridine (PYRIDIUM) 200 MG tablet Take 1 tablet (200 mg total) by mouth 3 (three) times daily.  6 tablet  0  . polyethylene glycol (GLYCOLAX) packet Take 17 g by mouth daily as needed. Add to 8 ounces fluid & take as needed for constipation        No current facility-administered  medications on file prior to visit.    Health Maintenance: flu vaccine  Review of Systems     Objective:   Physical Exam BP 147/85  Pulse 89  Temp(Src) 97.9 F (36.6 C) (Oral)  Ht 5\' 4"  (1.626 m)  Wt 177 lb 14.4 oz (80.695 kg)  BMI 30.52 kg/m2  LMP 02/17/2014 Gen: NAD, alert, cooperative with exam, well-appearing CV: RRR, good S1/S2, no murmur, no edema, capillary refill brisk  Resp: CTABL, no wheezes, non-labored MSK: negative tinel's sign, normal grip strength.  Skin: no rashes, normal turgor  Neuro: no gross deficits.       Assessment & Plan:

## 2014-02-25 ENCOUNTER — Telehealth: Payer: Self-pay | Admitting: *Deleted

## 2014-02-25 DIAGNOSIS — R200 Anesthesia of skin: Secondary | ICD-10-CM | POA: Insufficient documentation

## 2014-02-25 DIAGNOSIS — R202 Paresthesia of skin: Secondary | ICD-10-CM

## 2014-02-25 NOTE — Telephone Encounter (Signed)
LVM for patient to call back. ?

## 2014-02-25 NOTE — Telephone Encounter (Signed)
Message copied by Johny Shears on Tue Feb 25, 2014  2:50 PM ------      Message from: Rosemarie Ax      Created: Tue Feb 25, 2014  2:25 PM       Please call patient and inform that her urinalysis was normal. Thank you. ------

## 2014-02-25 NOTE — Assessment & Plan Note (Signed)
Most likely Carpal Tunnel Syndrome as it only occurs after a day of working. Symptoms are at night or the next morning. No exam findings to suggest (negative Tinel's sign, no atrophy). Good grip strength.  - recommended cock-up splint. Can pick this up at the pharmacy.  - consider EMG if symptoms persist.  - ibuprofen PRN for pain

## 2014-02-25 NOTE — Assessment & Plan Note (Signed)
She reports worsening of her depression as of late. Compliant with medications. Denies any SI or HI.  - increased Bupropion to 150 mg BID  - f/u in 2-4 weeks.

## 2014-02-26 NOTE — Telephone Encounter (Signed)
Message copied by Johny Shears on Wed Feb 26, 2014  8:45 AM ------      Message from: Rosemarie Ax      Created: Tue Feb 25, 2014  2:25 PM       Please call patient and inform that her urinalysis was normal. Thank you. ------

## 2014-02-26 NOTE — Telephone Encounter (Signed)
LVM for patient to call back. ?

## 2014-02-26 NOTE — Telephone Encounter (Signed)
Pt called and was informed about her test results. jw

## 2014-03-05 ENCOUNTER — Emergency Department (HOSPITAL_COMMUNITY)
Admission: EM | Admit: 2014-03-05 | Discharge: 2014-03-05 | Disposition: A | Discharge status: Home / Self Care | Payer: Medicare Other | Source: Home / Self Care | Attending: Emergency Medicine | Admitting: Emergency Medicine

## 2014-03-05 ENCOUNTER — Encounter (HOSPITAL_COMMUNITY): Payer: Self-pay | Admitting: Emergency Medicine

## 2014-03-05 DIAGNOSIS — J040 Acute laryngitis: Secondary | ICD-10-CM

## 2014-03-05 LAB — POCT RAPID STREP A: STREPTOCOCCUS, GROUP A SCREEN (DIRECT): NEGATIVE

## 2014-03-05 MED ORDER — PREDNISONE 20 MG PO TABS: 20.0000 mg | ORAL_TABLET | Freq: Two times a day (BID) | ORAL | Status: DC

## 2014-03-05 MED ORDER — IPRATROPIUM BROMIDE 0.06 % NA SOLN: 2.0000 | Freq: Four times a day (QID) | NASAL | Status: DC

## 2014-03-05 MED ORDER — GUAIFENESIN-CODEINE 100-10 MG/5ML PO SYRP: 5.0000 mL | ORAL_SOLUTION | Freq: Three times a day (TID) | ORAL | Status: DC | PRN

## 2014-03-05 NOTE — ED Provider Notes (Signed)
  Chief Complaint   URI   History of Present Illness   Allison Steele is a 43 year old female who has had a five-day history of sore throat, hoarseness, slight nonproductive cough, chest pain, nasal congestion with yellow rhinorrhea, burning of the nose, headache, aching in the ears, left eye pain, and right facial swelling. She denies fever, chills, shortness of breath, or GI symptoms.  Review of Systems   Other than as noted above, the patient denies any of the following symptoms: Systemic:  No fevers, chills, sweats, or myalgias. Eye:  No redness or discharge. ENT:  No ear pain, headache, nasal congestion, drainage, sinus pressure, or sore throat. Neck:  No neck pain, stiffness, or swollen glands. Lungs:  No cough, sputum production, hemoptysis, wheezing, chest tightness, shortness of breath or chest pain. GI:  No abdominal pain, nausea, vomiting or diarrhea.  [place]   Past medical history, family history, social history, meds, and allergies were reviewed. She has no medication allergies. She takes omeprazole, Wellbutrin, Zyrtec, Plaquenil, lisinopril/hydrochlorothiazide, and naproxen. She has a history of sarcoidosis, she is legally blind, has hypertension, depression, and gastroesophageal reflux.  Physical exam   Vital signs:  BP 135/84  Pulse 80  Temp(Src) 98.7 F (37.1 C) (Oral)  Resp 16  SpO2 100%  LMP 02/17/2014 General:  Alert and oriented.  In no distress.  Skin warm and dry. Eye:  No conjunctival injection or drainage. Lids were normal. ENT:  TMs and canals were normal, without erythema or inflammation.  Nasal mucosa was clear and uncongested, without drainage.  Mucous membranes were moist.  Pharynx was erythematous with no exudate or drainage.  There were no oral ulcerations or lesions. Neck:  Supple, no adenopathy, tenderness or mass. Lungs:  No respiratory distress.  Lungs were clear to auscultation, without wheezes, rales or rhonchi.  Breath sounds were clear and  equal bilaterally.  Heart:  Regular rhythm, without gallops, murmers or rubs. Skin:  Clear, warm, and dry, without rash or lesions.  Labs   Results for orders placed during the hospital encounter of 03/05/14  POCT RAPID STREP A ([facility] ONLY)      Result Value Ref Range   Streptococcus, Group A Screen (Direct) NEGATIVE  NEGATIVE    Assessment     The encounter diagnosis was Laryngitis.  No indication for antibiotics.  Plan    1.  Meds:  The following meds were prescribed:   New Prescriptions   GUAIFENESIN-CODEINE (ROBITUSSIN AC) 100-10 MG/5ML SYRUP    Take 5 mLs by mouth 3 (three) times daily as needed for cough.   IPRATROPIUM (ATROVENT) 0.06 % NASAL SPRAY    Place 2 sprays into both nostrils 4 (four) times daily.   PREDNISONE (DELTASONE) 20 MG TABLET    Take 1 tablet (20 mg total) by mouth 2 (two) times daily.    2.  Patient Education/Counseling:  The patient was given appropriate handouts, self care instructions, and instructed in symptomatic relief.  Instructed to get extra fluids and extra rest.    3.  Follow up:  The patient was told to follow up here if no better in 3 to 4 days, or sooner if becoming worse in any way, and given some red flag symptoms such as increasing fever, difficulty breathing, chest pain, or persistent vomiting which would prompt immediate return.       Harden Mo, MD 03/05/14 [phone]

## 2014-03-05 NOTE — ED Notes (Signed)
C/o congestion, lost voice since Saturday

## 2014-03-05 NOTE — Discharge Instructions (Signed)
Laryngitis °At the top of your windpipe is your voice box. It is the source of your voice. Inside your voice box are 2 bands of muscles called vocal cords. When you breathe, your vocal cords are relaxed and open so that air can get into the lungs. When you decide to say something, these cords come together and vibrate. The sound from these vibrations goes into your throat and comes out through your mouth as sound.  °Laryngitis is an inflammation of the vocal cords that causes hoarseness, cough, loss of voice, sore throat, and dry throat. Laryngitis can be temporary (acute) or long-term (chronic). Most cases of acute laryngitis improve with time.Chronic laryngitis lasts for more than 3 weeks. °CAUSES °Laryngitis can often be related to excessive smoking, talking, or yelling, as well as inhalation of toxic fumes and allergies. Acute laryngitis is usually caused by a viral infection, vocal strain, measles or mumps, or bacterial infections. Chronic laryngitis is usually caused by vocal cord strain, vocal cord injury, postnasal drip, growths on the vocal cords, or acid reflux. °SYMPTOMS  °· Cough. °· Sore throat. °· Dry throat. °RISK FACTORS °· Respiratory infections. °· Exposure to irritating substances, such as cigarette smoke, excessive amounts of alcohol, stomach acids, and workplace chemicals. °· Voice trauma, such as vocal cord injury from shouting or speaking too loud. °DIAGNOSIS  °Your cargiver will perform a physical exam. During the physical exam, your caregiver will examine your throat. The most common sign of laryngitis is hoarseness. Laryngoscopy may be necessary to confirm the diagnosis of this condition. This procedure allows your caregiver to look into the larynx. °HOME CARE INSTRUCTIONS °· Drink enough fluids to keep your urine clear or pale yellow. °· Rest until you no longer have symptoms or as directed by your caregiver. °· Breathe in moist air. °· Take all medicine as directed by your  caregiver. °· Do not smoke. °· Talk as little as possible (this includes whispering). °· Write on paper instead of talking until your voice is back to normal. °· Follow up with your caregiver if your condition has not improved after 10 days. °SEEK MEDICAL CARE IF:  °· You have trouble breathing. °· You cough up blood. °· You have persistent fever. °· You have increasing pain. °· You have difficulty swallowing. °MAKE SURE YOU: °· Understand these instructions. °· Will watch your condition. °· Will get help right away if you are not doing well or get worse. °Document Released: 05/23/2005 Document Revised: 08/15/2011 Document Reviewed: 07/29/2010 °[employer]® Patient Information ©2015 [employer]. This information is not intended to replace advice given to you by your health care provider. Make sure you discuss any questions you have with your health care provider. ° °

## 2014-03-06 ENCOUNTER — Ambulatory Visit (INDEPENDENT_AMBULATORY_CARE_PROVIDER_SITE_OTHER): Payer: Medicare Other | Admitting: Family Medicine

## 2014-03-06 ENCOUNTER — Telehealth: Payer: Self-pay | Admitting: *Deleted

## 2014-03-06 ENCOUNTER — Encounter: Payer: Self-pay | Admitting: Family Medicine

## 2014-03-06 VITALS — BP 144/79 | HR 94 | Temp 98.1°F | Wt 175.0 lb

## 2014-03-06 DIAGNOSIS — J0110 Acute frontal sinusitis, unspecified: Secondary | ICD-10-CM

## 2014-03-06 DIAGNOSIS — J019 Acute sinusitis, unspecified: Secondary | ICD-10-CM | POA: Insufficient documentation

## 2014-03-06 DIAGNOSIS — J040 Acute laryngitis: Secondary | ICD-10-CM

## 2014-03-06 MED ORDER — AMOXICILLIN-POT CLAVULANATE 875-125 MG PO TABS: 1.0000 | ORAL_TABLET | Freq: Two times a day (BID) | ORAL | Status: DC

## 2014-03-06 NOTE — Telephone Encounter (Signed)
Appt made with same day doctor at 2:30 PM today; having burning of the eyes and nose.  Derl Barrow, RN

## 2014-03-06 NOTE — Progress Notes (Signed)
   Subjective:    Patient ID: Allison Steele, female    DOB: July 07, 1970, 43 y.o.   MRN: [id number]  Patient presents for a same day appointment.  HPI  LARYNGITIS / COUGH /  HEAD CONGESTION: - Reports Saturday she woke up with sore throat, also with associated sinus congestion and pressure, sneezing. Symptoms worsened by Monday she had lost her voice and felt hoarse, also worsened with cough at night associated with "phlegm stuck in throat" but cough is non-productive, tried lozenges, went to Urgent Care yesterday told she had laryngitis, given Atrovent nasal spray and Prednisone 20mg  BID x 5 days for laryngitis - Tried Mucinex, Sinus medication, cough drops - without any significant relief. Didn't take Tylenol or Ibuprofen. - Currently reports worsening today with Left facial / forehead pressure - No prior similar episode before - Admits some chest tightness, nose burning, nasal congestion, headache with sinus pressure - Denies fevers/chills, vision changes, chest pain or pressure at rest or with activity, SOB, nausea / vomiting, diarrhea, abdominal pain  I have reviewed and updated the following as appropriate: allergies and current medications  Social Hx: - Never smoker  Review of Systems  See above HPI    Objective:   Physical Exam  BP 144/79  Pulse 94  Temp(Src) 98.1 F (36.7 C) (Oral)  Wt 175 lb (79.379 kg)  LMP 02/17/2014  Gen - overall well, but currently ill-appearing and non-toxic, cooperative, NAD HEENT - NCAT, L>R frontal and maxillary sinus tenderness to palpation without swelling or erythema, PERRL, EOMI, b/l edematous turbinates w/o congestion, oropharynx mild erythema without edema, erythema, exudates or asymmetry, MMM Neck - supple, non-tender, no LAD Heart - RRR, no murmurs heard Lungs - Mostly CTAB with some transmitted upper airway congestion clear on cough, otherwise no wheezing, no focal crackles, or rhonchi. Normal work of breathing. Ext - non-tender, no  edema, peripheral pulses intact +2 b/l Skin - warm, dry, no rashes Neuro - awake, alert, oriented, grossly non-focal     Assessment & Plan:   See specific A&P problem list for details.

## 2014-03-06 NOTE — Patient Instructions (Signed)
Dear Allison Steele, Thank you for coming in to clinic today.  1. It sounds like you have "Sinusitis" - this probably started with a viral infection causing your sore throat and Laryngitis, but this seems to have progressed to a sinus infection. 2. Start antibiotic Augmentin - take 1 tablet twice daily for total 10 days. 3. Continue prednisone as previously prescribed for 1-2 more days, then you may discontinue this. 4. Continue nasal spray, and recommend Simply Saline Nasal spray to flush sinuses. Continue Mucinex. Tylenol as needed  Please schedule a follow-up appointment with Dr. Raeford Razor in 2 weeks if symptoms persist or sooner if worsen.  If you have any other questions or concerns, please feel free to call the clinic to contact me. You may also schedule an earlier appointment if necessary.  However, if your symptoms get significantly worse, please go to the Emergency Department to seek immediate medical attention.  Nobie Putnam, DO [facility]   Sinusitis Sinusitis is redness, soreness, and inflammation of the paranasal sinuses. Paranasal sinuses are air pockets within the bones of your face (beneath the eyes, the middle of the forehead, or above the eyes). In healthy paranasal sinuses, mucus is able to drain out, and air is able to circulate through them by way of your nose. However, when your paranasal sinuses are inflamed, mucus and air can become trapped. This can allow bacteria and other germs to grow and cause infection. Sinusitis can develop quickly and last only a short time (acute) or continue over a long period (chronic). Sinusitis that lasts for more than 12 weeks is considered chronic.  CAUSES  Causes of sinusitis include:  Allergies.  Structural abnormalities, such as displacement of the cartilage that separates your nostrils (deviated septum), which can decrease the air flow through your nose and sinuses and affect sinus drainage.  Functional  abnormalities, such as when the small hairs (cilia) that line your sinuses and help remove mucus do not work properly or are not present. SIGNS AND SYMPTOMS  Symptoms of acute and chronic sinusitis are the same. The primary symptoms are pain and pressure around the affected sinuses. Other symptoms include:  Upper toothache.  Earache.  Headache.  Bad breath.  Decreased sense of smell and taste.  A cough, which worsens when you are lying flat.  Fatigue.  Fever.  Thick drainage from your nose, which often is green and may contain pus (purulent).  Swelling and warmth over the affected sinuses. DIAGNOSIS  Your health care provider will perform a physical exam. During the exam, your health care provider may:  Look in your nose for signs of abnormal growths in your nostrils (nasal polyps).  Tap over the affected sinus to check for signs of infection.  View the inside of your sinuses (endoscopy) using an imaging device that has a light attached (endoscope). If your health care provider suspects that you have chronic sinusitis, one or more of the following tests may be recommended:  Allergy tests.  Nasal culture. A sample of mucus is taken from your nose, sent to a lab, and screened for bacteria.  Nasal cytology. A sample of mucus is taken from your nose and examined by your health care provider to determine if your sinusitis is related to an allergy. TREATMENT  Most cases of acute sinusitis are related to a viral infection and will resolve on their own within 10 days. Sometimes medicines are prescribed to help relieve symptoms (pain medicine, decongestants, nasal steroid sprays, or saline sprays).  However, for sinusitis related to a bacterial infection, your health care provider will prescribe antibiotic medicines. These are medicines that will help kill the bacteria causing the infection.  Rarely, sinusitis is caused by a fungal infection. In theses cases, your health care provider  will prescribe antifungal medicine. For some cases of chronic sinusitis, surgery is needed. Generally, these are cases in which sinusitis recurs more than 3 times per year, despite other treatments. HOME CARE INSTRUCTIONS   Drink plenty of water. Water helps thin the mucus so your sinuses can drain more easily.  Use a humidifier.  Inhale steam 3 to 4 times a day (for example, sit in the bathroom with the shower running).  Apply a warm, moist washcloth to your face 3 to 4 times a day, or as directed by your health care provider.  Use saline nasal sprays to help moisten and clean your sinuses.  Take medicines only as directed by your health care provider.  If you were prescribed either an antibiotic or antifungal medicine, finish it all even if you start to feel better. SEEK IMMEDIATE MEDICAL CARE IF:  You have increasing pain or severe headaches.  You have nausea, vomiting, or drowsiness.  You have swelling around your face.  You have vision problems.  You have a stiff neck.  You have difficulty breathing. MAKE SURE YOU:   Understand these instructions.  Will watch your condition.  Will get help right away if you are not doing well or get worse. Document Released: 05/23/2005 Document Revised: 10/07/2013 Document Reviewed: 06/07/2011 [facility] Patient Information 2015 [place], [place]. This information is not intended to replace advice given to you by your health care provider. Make sure you discuss any questions you have with your health care provider.

## 2014-03-06 NOTE — Telephone Encounter (Signed)
Pt seen at urgent care yesterday, dx with laryngitis, pt having some other problems today, wants advice if she should come in or wait it out. Allison Steele

## 2014-03-07 LAB — CULTURE, GROUP A STREP

## 2014-03-07 NOTE — ED Notes (Signed)
final report of culture throat neagtive

## 2014-03-07 NOTE — Assessment & Plan Note (Addendum)
Constellation of symptoms consistent with suspected initial viral URI / laryngitis, now progressively worsening sinus congestion, pain, and pressure concerning for possible acute sinusitis, symptoms near 7 days, unilateral L>R - Afebrile, pharynx and lungs without focal findings of infection, transillumination sinuses unremarkable - No acute red flags, no recent prior sinusitis or antibiotic courses  Plan: 1. Start Augmentin BID x 10 days 2. Continue previously rx Prednisone for laryngitis x 1-2 more days then DC 3. Continue symptomatic management with nasal sprays, recommended also nasal saline, sinus effleurage as demonstrated, warm compresses, Tylenol / Ibuprofen PRN 4. RTC 10-14 days if symptoms worsening or not improving

## 2014-03-14 ENCOUNTER — Other Ambulatory Visit: Payer: Self-pay | Admitting: *Deleted

## 2014-03-14 DIAGNOSIS — I10 Essential (primary) hypertension: Secondary | ICD-10-CM

## 2014-03-18 MED ORDER — LISINOPRIL-HYDROCHLOROTHIAZIDE 20-25 MG PO TABS: ORAL_TABLET | ORAL | Status: DC

## 2014-03-19 ENCOUNTER — Other Ambulatory Visit: Payer: Self-pay | Admitting: *Deleted

## 2014-03-19 DIAGNOSIS — I10 Essential (primary) hypertension: Secondary | ICD-10-CM

## 2014-03-20 MED ORDER — LISINOPRIL-HYDROCHLOROTHIAZIDE 20-25 MG PO TABS: ORAL_TABLET | ORAL | Status: DC

## 2014-04-18 ENCOUNTER — Encounter: Payer: Medicare Other | Admitting: Family Medicine

## 2014-05-13 ENCOUNTER — Other Ambulatory Visit: Payer: Self-pay | Admitting: *Deleted

## 2014-05-13 DIAGNOSIS — F32A Depression, unspecified: Secondary | ICD-10-CM

## 2014-05-13 DIAGNOSIS — F329 Major depressive disorder, single episode, unspecified: Secondary | ICD-10-CM

## 2014-05-14 ENCOUNTER — Encounter: Payer: Self-pay | Admitting: Family Medicine

## 2014-05-14 ENCOUNTER — Ambulatory Visit (INDEPENDENT_AMBULATORY_CARE_PROVIDER_SITE_OTHER): Payer: Medicare Other | Admitting: Family Medicine

## 2014-05-14 VITALS — BP 153/88 | HR 85 | Temp 98.0°F | Ht 64.0 in | Wt 181.6 lb

## 2014-05-14 DIAGNOSIS — R200 Anesthesia of skin: Secondary | ICD-10-CM

## 2014-05-14 DIAGNOSIS — K219 Gastro-esophageal reflux disease without esophagitis: Secondary | ICD-10-CM

## 2014-05-14 DIAGNOSIS — R202 Paresthesia of skin: Secondary | ICD-10-CM

## 2014-05-14 DIAGNOSIS — F341 Dysthymic disorder: Secondary | ICD-10-CM

## 2014-05-14 MED ORDER — BUPROPION HCL ER (SR) 150 MG PO TB12: 150.0000 mg | ORAL_TABLET | Freq: Two times a day (BID) | ORAL | Status: DC

## 2014-05-14 NOTE — Patient Instructions (Signed)
Thank you for coming in,   Please try to wear the brace for you carpal tunnel. That will help the best.  We can perform some lab tests next time.    Avoid lots of chocolate, caffeine, alcohol or eating late at night. These things will make your reflux worse.   Follow up with me in 3-6 months depending on your schedule.    Please feel free to call with any questions or concerns at any time, at [phone]. --Dr. Raeford Razor

## 2014-05-14 NOTE — Progress Notes (Signed)
Subjective:    Patient ID: Allison Steele, female    DOB: 1970/10/24, 43 y.o.   MRN: [id number]  HPI  QUETZALI HEINLE is here for f/u.   She has been having dyspepsia for about a week. She has been on omeprazole but still symptomatic. She drinks caffiene, eats chocolate, fatty meals and is overweight. Denies any unintentional weight loss. No problems with swallowing. She eats at late at night.   Hx of carpal tunnel syn on right. She hasn't been able to wear a brace. She is symptomatic after a day of work. 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 Her symptoms haven't changed since last evaluation. It has been ongoing for 2-3 months.   Depression. Ongoing. Her wellbutrin was increased last visit but she is still taking her previous dose. She is stable withno SI/HI.    Current Outpatient Prescriptions on File Prior to Visit  Medication Sig Dispense Refill  . amoxicillin-clavulanate (AUGMENTIN) 875-125 MG per tablet Take 1 tablet by mouth 2 (two) times daily. For 10 days 20 tablet 0  . buPROPion (WELLBUTRIN SR) 150 MG 12 hr tablet Take 1 tablet (150 mg total) by mouth 2 (two) times daily. 60 tablet 1  . cetirizine (ZYRTEC) 10 MG tablet Take 1 tablet (10 mg total) by mouth daily. 30 tablet 11  . clobetasol cream (TEMOVATE) 0.05 %     . guaiFENesin-codeine (ROBITUSSIN AC) 100-10 MG/5ML syrup Take 5 mLs by mouth 3 (three) times daily as needed for cough. 120 mL 0  . hydroxychloroquine (PLAQUENIL) 200 MG tablet TAKE 1 TABLET BY MOUTH TWICE A DAY 60 tablet 0  . ipratropium (ATROVENT) 0.06 % nasal spray Place 2 sprays into both nostrils 4 (four) times daily. 15 mL 12  . lisinopril-hydrochlorothiazide (PRINZIDE,ZESTORETIC) 20-25 MG per tablet TAKE 1 TABLET BY MOUTH DAILY. 30 tablet 5  . naproxen (NAPROSYN) 500 MG tablet Take 1 tablet (500 mg total) by mouth 2 (two) times daily. 30 tablet 0  . norethindrone-ethinyl estradiol (JUNEL FE,GILDESS FE,LOESTRIN FE) 1-20 MG-MCG tablet Take 1  tablet by mouth daily. 1 Package 11  . omeprazole (PRILOSEC) 20 MG capsule TAKE 1 CAPSULE (20 MG TOTAL) BY MOUTH DAILY. 30 capsule 5  . phenazopyridine (PYRIDIUM) 200 MG tablet Take 1 tablet (200 mg total) by mouth 3 (three) times daily. 6 tablet 0  . polyethylene glycol (GLYCOLAX) packet Take 17 g by mouth daily as needed. Add to 8 ounces fluid & take as needed for constipation     . predniSONE (DELTASONE) 20 MG tablet Take 1 tablet (20 mg total) by mouth 2 (two) times daily. 10 tablet 0   No current facility-administered medications on file prior to visit.     Review of Systems See HPI     Objective:   Physical Exam BP 153/88 mmHg  Pulse 85  Temp(Src) 98 F (36.7 C) (Oral)  Wt 181 lb 9.6 oz (82.373 kg) Gen: NAD, alert, cooperative with exam hand Exam:  Laterality: right Appearance: no erythema, ecchymosis  Edema: no  Tenderness: no   Neurovascularly intact  Range of Motion:  Wrist Flexion: normal Wrist Extension:normal Wrist Ulnar deviation: normal Wrist Radial deviation: normal  Thumb flexion:normal  Thumb extension:normal  Thumb opposition: normal Maneuvers:  Finkelstein's: neg  Tinel's: neg Phalen's: neg Strength:  Wrist extension: 5/5 Wrist flexion: 5/5 Ulnar Deviation: 5/5 Radial Deviation: 5/5              Assessment & Plan:

## 2014-05-18 DIAGNOSIS — K219 Gastro-esophageal reflux disease without esophagitis: Secondary | ICD-10-CM | POA: Insufficient documentation

## 2014-05-18 NOTE — Assessment & Plan Note (Signed)
Stable and ongoing. She has been taking previous prescription of Wellbutrin, it was increased last visit.  - will take Wellbutrin BID as directed.  - f/u 3 months

## 2014-05-18 NOTE — Assessment & Plan Note (Signed)
Still symptomatic only after days of working. She hasn't been wearing brace. Discussed different treatment options.  - she wants to try brace  - she doesn't want injection at this time  - may try PT if continues to be a problem

## 2014-05-18 NOTE — Assessment & Plan Note (Signed)
Currently taking omeprazole. Reports symptoms of reflux.  - cont current meds. Given risk of increasing PPI for prolonged periods of time.  - will try to change current practices   - decrease caffeine   - not eat late at night   - decrease fast foods   - try to lose weight  - if all these fail then consider increasing PPI

## 2014-08-11 ENCOUNTER — Other Ambulatory Visit: Payer: Self-pay | Admitting: *Deleted

## 2014-08-11 DIAGNOSIS — F329 Major depressive disorder, single episode, unspecified: Secondary | ICD-10-CM

## 2014-08-11 DIAGNOSIS — F32A Depression, unspecified: Secondary | ICD-10-CM

## 2014-08-11 MED ORDER — BUPROPION HCL ER (SR) 150 MG PO TB12
150.0000 mg | ORAL_TABLET | Freq: Two times a day (BID) | ORAL | Status: DC
Start: 2014-08-11 — End: 2014-11-17

## 2014-11-17 ENCOUNTER — Other Ambulatory Visit: Payer: Self-pay | Admitting: *Deleted

## 2014-11-17 DIAGNOSIS — F32A Depression, unspecified: Secondary | ICD-10-CM

## 2014-11-17 DIAGNOSIS — F329 Major depressive disorder, single episode, unspecified: Secondary | ICD-10-CM

## 2014-11-17 MED ORDER — BUPROPION HCL ER (SR) 150 MG PO TB12
150.0000 mg | ORAL_TABLET | Freq: Two times a day (BID) | ORAL | Status: DC
Start: 2014-11-17 — End: 2014-12-24

## 2014-12-16 ENCOUNTER — Ambulatory Visit: Payer: Self-pay | Admitting: Family Medicine

## 2014-12-24 ENCOUNTER — Ambulatory Visit (INDEPENDENT_AMBULATORY_CARE_PROVIDER_SITE_OTHER): Payer: Medicare Other | Admitting: Family Medicine

## 2014-12-24 ENCOUNTER — Encounter: Payer: Self-pay | Admitting: Family Medicine

## 2014-12-24 VITALS — BP 143/85 | HR 85 | Temp 98.0°F | Ht 63.0 in | Wt 175.1 lb

## 2014-12-24 DIAGNOSIS — K219 Gastro-esophageal reflux disease without esophagitis: Secondary | ICD-10-CM

## 2014-12-24 DIAGNOSIS — F341 Dysthymic disorder: Secondary | ICD-10-CM

## 2014-12-24 DIAGNOSIS — F32A Depression, unspecified: Secondary | ICD-10-CM

## 2014-12-24 DIAGNOSIS — I10 Essential (primary) hypertension: Secondary | ICD-10-CM

## 2014-12-24 DIAGNOSIS — F329 Major depressive disorder, single episode, unspecified: Secondary | ICD-10-CM

## 2014-12-24 DIAGNOSIS — M79672 Pain in left foot: Secondary | ICD-10-CM

## 2014-12-24 MED ORDER — MELOXICAM 15 MG PO TABS: 15.0000 mg | ORAL_TABLET | Freq: Every day | ORAL | Status: DC

## 2014-12-24 MED ORDER — LISINOPRIL-HYDROCHLOROTHIAZIDE 20-25 MG PO TABS: ORAL_TABLET | ORAL | Status: DC

## 2014-12-24 MED ORDER — OMEPRAZOLE 20 MG PO CPDR: DELAYED_RELEASE_CAPSULE | ORAL | Status: DC

## 2014-12-24 MED ORDER — BUPROPION HCL ER (SR) 150 MG PO TB12: 150.0000 mg | ORAL_TABLET | Freq: Two times a day (BID) | ORAL | Status: DC

## 2014-12-24 NOTE — Assessment & Plan Note (Signed)
Symptoms controlled with wellbutrin therapy.  Continue with current regimen  - need PHQ-9 at F/u  - refilled med today

## 2014-12-24 NOTE — Assessment & Plan Note (Signed)
Stable with medication - refilled meds

## 2014-12-24 NOTE — Patient Instructions (Signed)
Thank you for coming in,   We will try the mobic for two weeks. Continue ice and compression.   If no improvement in two weeks then we may need to try physical therapy.   Please bring all of your medications with you to each visit.    Please feel free to call with any questions or concerns at any time, at [phone]. --Dr. Raeford Razor  Diet Recommendations for hypertension Starchy (carb) foods include: Bread, rice, pasta, potatoes, corn, crackers, bagels, muffins, all baked goods.   Protein foods include: Meat, fish, poultry, eggs, dairy foods, and beans such as pinto and kidney beans (beans also provide carbohydrate).   1. Eat at least 3 meals and 1-2 snacks per day. Never go more than 4-5 hours while awake without eating.  2. Limit starchy foods to TWO per meal and ONE per snack. ONE portion of a starchy  food is equal to the following:   - ONE slice of bread (or its equivalent, such as half of a hamburger bun).   - 1/2 cup of a "scoopable" starchy food such as potatoes or rice.   - 1 OUNCE (28 grams) of starchy snack foods such as crackers or pretzels (look on label).   - 15 grams of carbohydrate as shown on food label.  3. Both lunch and dinner should include a protein food, a carb food, and vegetables.   - Obtain twice as many veg's as protein or carbohydrate foods for both lunch and dinner.   - Try to keep frozen veg's on hand for a quick vegetable serving.     - Fresh or frozen veg's are best.  4. Breakfast should always include protein.    Elastic Bandage and RICE Elastic bandages come in different shapes and sizes. They perform different functions. Your caregiver will help you to decide what is best for your protection, recovery, or rehabilitation following an injury. The following are some general tips to help you use an elastic bandage.  Use the bandage as directed by the maker of the bandage you are using.  Do not wrap it too tight. This may cut off the circulation of the arm  or leg below the bandage.  If part of your body beyond the bandage becomes blue, numb, or swollen, it is too tight. Loosen the bandage as needed to prevent these problems.  See your caregiver or trainer if the bandage seems to be making your problems worse rather than better. Bandages may be a reminder to you that you have an injury. However, they provide very little support. The few pounds of support they provide are minor considering the pressure it takes to injure a joint or tear ligaments. Therefore, the joint will not be able to handle all of the wear and tear it could before the injury. The routine care of many injuries includes Rest, Ice, Compression, and Elevation (RICE).  Rest is required to allow your body to heal. Generally, routine activities can be resumed when comfortable. Injured tendons and bones take about 6 weeks to heal.  Icing the injury helps keep the swelling down and reduces pain. Do not apply ice directly to the skin. Put ice in a plastic bag. Place a towel between the skin and the bag. This will prevent frostbite to the skin. Apply ice bags to the injured area for 15-20 minutes, every 2 hours while awake. Do this for the first 24 to 48 hours, then as directed by your caregiver.  Compression helps keep swelling  down, gives support, and helps with discomfort. If an elastic bandage has been applied today, it should be removed and reapplied every 3 to 4 hours. It should not be applied tightly, but firmly enough to keep swelling down. Watch fingers or toes for swelling, bluish discoloration, coldness, numbness, or increased pain. If any of these problems occur, remove the bandage and reapply it more loosely. If these problems persist, contact your caregiver.  Elevation helps reduce swelling and decreases pain. The injured area (arms, hands, legs, or feet) should be placed near to or above the heart (center of the chest) if able. Persistent pain and inability to use the injured area  for more than 2 to 3 days are warning signs. You should see a caregiver for a follow-up visit as soon as possible. Initially, a minor broken bone (hairline fracture) may not be seen on X-rays. It may take 7 to 10 days to finally show up. Continued pain and swelling show that further evaluation and/or X-rays are needed. Make a follow-up visit with your caregiver. A specialist in reading X-rays (radiologist) will read your X-rays again. Finding out the results of your test Not all test results are available during your visit. If your test results are not back during the visit, make an appointment with your caregiver to find out the results. Do not assume everything is normal if you have not heard from your caregiver or the medical facility. It is important for you to follow up on all of your test results. Document Released: 11/12/2001 Document Revised: 08/15/2011 Document Reviewed: 09/24/2007 [facility] Patient Information 2015 [place], [place]. This information is not intended to replace advice given to you by your health care provider. Make sure you discuss any questions you have with your health care provider.

## 2014-12-24 NOTE — Assessment & Plan Note (Addendum)
No injury. Patient has been working out more and she stands at work a lot.  Likely to peroneal tendon. Motion and strength intact.  No suggestion of lateral malleolus or base of fifth metatarsal frx  Checked with [place] limited today that showed some effusion along peroneal tendon  - mobic for two weeks  - given home modalities  - continue compression and ice  - if no improved in 3-4 weeks then consider PT vs referral to [facility] for [place] eval

## 2014-12-24 NOTE — Progress Notes (Addendum)
Subjective:    HPI  Allison Steele is here for left foot pain. Hx of sarcoidosis, legally blind, depression, and leber's hereditary optic neuropathy.   Left foot pain: Location: back of her left foot  Pain started: two weeks ago  Pain is: sharp Severity: 8-9/10 Medications tried: asper cream, has been wearing a compression intermittently  Recent trauma: no  Similar pain previously: yes, surgery 2004  Symptoms Redness:no Swelling:yes  Fever: no Weakness: no Weight loss: no Rash: no  GERD: improved with the medication. Still occurs only after she eats.   DEPRESSION Disease Monitoring Current symptoms include insomnia             Symptoms have been unchanged      Is Exercising yes at the [facility] and [employer]    Evidence of suicidal ideation: no  Medication Monitoring Compliance:  taking wellbutrin as prescribed        Insomnia yes GI symptoms no   PMH - Smoking status noted.    Health Maintenance:  Health Maintenance Due  Topic Date Due  . HIV Screening  08/21/1986    -  reports that she has never smoked. She has never used smokeless tobacco. - Review of Systems: Per HPI. All other systems reviewed and are negative. - Past Medical History: Patient Active Problem List   Diagnosis Date Noted  . Esophageal reflux 05/18/2014  . Acute sinusitis 03/06/2014  . Numbness and tingling in right hand 02/25/2014  . Overweight(278.02) 03/17/2010  . DEPRESSION/ANXIETY 05/13/2009  . 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 HYPERTENSION, BENIGN SYSTEMIC 08/03/2006   - Medications: reviewed and updated Current Outpatient Prescriptions  Medication Sig Dispense Refill  . buPROPion (WELLBUTRIN SR) 150 MG 12 hr tablet Take 1 tablet (150 mg total) by mouth 2 (two) times daily. 60 tablet 3  . cetirizine (ZYRTEC) 10 MG tablet Take 1 tablet (10 mg total) by mouth daily. 30 tablet 11  . clobetasol cream (TEMOVATE) 0.05 %     .  hydroxychloroquine (PLAQUENIL) 200 MG tablet TAKE 1 TABLET BY MOUTH TWICE A DAY 60 tablet 0  . ipratropium (ATROVENT) 0.06 % nasal spray Place 2 sprays into both nostrils 4 (four) times daily. 15 mL 12  . lisinopril-hydrochlorothiazide (PRINZIDE,ZESTORETIC) 20-25 MG per tablet TAKE 1 TABLET BY MOUTH DAILY. 30 tablet 5  . norethindrone-ethinyl estradiol (JUNEL FE,GILDESS FE,LOESTRIN FE) 1-20 MG-MCG tablet Take 1 tablet by mouth daily. 1 Package 11  . omeprazole (PRILOSEC) 20 MG capsule TAKE 1 CAPSULE (20 MG TOTAL) BY MOUTH DAILY. 30 capsule 5  . phenazopyridine (PYRIDIUM) 200 MG tablet Take 1 tablet (200 mg total) by mouth 3 (three) times daily. 6 tablet 0  . polyethylene glycol (GLYCOLAX) packet Take 17 g by mouth daily as needed. Add to 8 ounces fluid & take as needed for constipation      No current facility-administered medications for this visit.   Review of Systems See HPI     Objective:   Physical Exam BP 143/85 mmHg  Pulse 85  Temp(Src) 98 F (36.7 C) (Oral)  Ht 5\' 3"  (1.6 m)  Wt 175 lb 1.6 oz (79.425 kg)  BMI 31.03 kg/m2  LMP 12/22/2014 Gen: NAD, alert, cooperative with exam, well-appearing HEENT: NCAT, PERRL, clear conjunctiva, oropharynx clear, supple neck CV: RRR, good S1/S2, no murmur, no edema, capillary refill brisk  Resp: CTABL, no wheezes, non-labored Abd: SNTND, BS present, no guarding or organomegaly Skin: no rashes, normal turgor  Neuro: no gross deficits.  MSK: mild effusion in lateral hindfoot,  TTP lateral hindfoot coursing path of tendons.  No achilles pain.  Ankle with FROM,  normal strength,  Achilles +2 DTR's     Assessment & Plan:

## 2015-01-08 ENCOUNTER — Other Ambulatory Visit: Payer: Self-pay | Admitting: *Deleted

## 2015-01-08 DIAGNOSIS — K219 Gastro-esophageal reflux disease without esophagitis: Secondary | ICD-10-CM

## 2015-01-08 MED ORDER — OMEPRAZOLE 20 MG PO CPDR: DELAYED_RELEASE_CAPSULE | ORAL | Status: DC

## 2015-01-09 ENCOUNTER — Other Ambulatory Visit: Payer: Self-pay | Admitting: *Deleted

## 2015-01-09 DIAGNOSIS — M79672 Pain in left foot: Secondary | ICD-10-CM

## 2015-01-09 MED ORDER — MELOXICAM 15 MG PO TABS: 15.0000 mg | ORAL_TABLET | Freq: Every day | ORAL | Status: DC | PRN

## 2015-05-06 ENCOUNTER — Ambulatory Visit (INDEPENDENT_AMBULATORY_CARE_PROVIDER_SITE_OTHER): Payer: Medicare Other | Admitting: Internal Medicine

## 2015-05-06 ENCOUNTER — Encounter: Payer: Self-pay | Admitting: Internal Medicine

## 2015-05-06 VITALS — BP 133/86 | HR 78 | Temp 98.1°F | Ht 64.0 in | Wt 174.3 lb

## 2015-05-06 DIAGNOSIS — Z23 Encounter for immunization: Secondary | ICD-10-CM | POA: Diagnosis not present

## 2015-05-06 DIAGNOSIS — T25221A Burn of second degree of right foot, initial encounter: Secondary | ICD-10-CM | POA: Diagnosis not present

## 2015-05-06 NOTE — Assessment & Plan Note (Signed)
As patient has no systemic signs of infection and no signs of infection on physical exam, will not treat with antibiotics today.  - Foot was wrapped with Xeroform occlusive gauze with overlying regular gauze. Patient instructed to return in 2-3 days for nurse visit to have foot rewrapped in same manner. She was instructed to use Vaseline and regular gauze if necessary 2-3 days after application of second dressing.  -If signs of infection on physical exam, would recommend topical bacitracin.

## 2015-05-06 NOTE — Progress Notes (Signed)
   Subjective:    Patient ID: Allison Steele, female    DOB: 1970/08/13, 44 y.o.   MRN: [id number]  HPI  Allison Steele is a 44 yo F presenting with a burn of her right foot.   Right foot burn Ms. Trench reports that 6 days ago she spilled hot [place] juice on the top of her right foot at Thanksgiving dinner. A blister formed in that area the same day. She reports some redness and pain at the burn site at that time; the erythema has since resolved and she has only minimal pain now. She tried a burn spray from [place] as well as ibuprofen to help with the pain initially, which was not very effective. She denies any swelling of her feet or drainage. She was not burned elsewhere. She is able to wear socks and tennis shoes and can walk normally. She denies fevers, nausea/vomiting, or chills.   Review of Systems See HPI.    Objective:   Physical Exam  Constitutional: She is oriented to person, place, and time. She appears well-developed and well-nourished. No distress.  HENT:  Head: Normocephalic and atraumatic.  Pulmonary/Chest: Effort normal. No respiratory distress.  Musculoskeletal:       Feet:  Neurological: She is alert and oriented to person, place, and time.  Psychiatric: She has a normal mood and affect. Her behavior is normal.  Vitals reviewed.     Assessment & Plan:  Burn of foot, right, second degree As patient has no systemic signs of infection and no signs of infection on physical exam, will not treat with antibiotics today.  - Foot was wrapped with Xeroform occlusive gauze with overlying regular gauze. Patient instructed to return in 2-3 days for nurse visit to have foot rewrapped in same manner. She was instructed to use Vaseline and regular gauze if necessary 2-3 days after application of second dressing.  -If signs of infection on physical exam, would recommend topical bacitracin.    Adin Hector, MD PGY-1 [facility]

## 2015-05-06 NOTE — Patient Instructions (Signed)
It was nice meeting you today, Ms. Allison Steele.   For your burn, please return to our office in 2-3 days for a nurse visit to have your dressings changed. After that, you can leave the new dressing on for another 2-3 days. If the burn is still present at that point, you can put Vaseline on the burn and cover it with regular gauze from the drugstore.  If you develop fevers, chills, or nausea or vomiting, please call our office, as this may be a sign of infection.   If you have any questions or concerns, do not hesitate to call the office.   Be well,  Dr. Avon Gully

## 2015-06-02 ENCOUNTER — Encounter: Payer: Self-pay | Admitting: Family Medicine

## 2015-06-02 ENCOUNTER — Ambulatory Visit (INDEPENDENT_AMBULATORY_CARE_PROVIDER_SITE_OTHER): Payer: Medicare Other | Admitting: Family Medicine

## 2015-06-02 VITALS — BP 126/80 | HR 100 | Temp 98.3°F | Ht 64.0 in | Wt 171.0 lb

## 2015-06-02 DIAGNOSIS — K219 Gastro-esophageal reflux disease without esophagitis: Secondary | ICD-10-CM

## 2015-06-02 DIAGNOSIS — I10 Essential (primary) hypertension: Secondary | ICD-10-CM

## 2015-06-02 DIAGNOSIS — F341 Dysthymic disorder: Secondary | ICD-10-CM

## 2015-06-02 DIAGNOSIS — K0889 Other specified disorders of teeth and supporting structures: Secondary | ICD-10-CM | POA: Diagnosis not present

## 2015-06-02 DIAGNOSIS — F329 Major depressive disorder, single episode, unspecified: Secondary | ICD-10-CM | POA: Diagnosis not present

## 2015-06-02 DIAGNOSIS — F32A Depression, unspecified: Secondary | ICD-10-CM

## 2015-06-02 MED ORDER — HYDROCODONE-ACETAMINOPHEN 5-325 MG PO TABS: 1.0000 | ORAL_TABLET | Freq: Four times a day (QID) | ORAL | Status: DC | PRN

## 2015-06-02 MED ORDER — BUPROPION HCL ER (SR) 150 MG PO TB12: 150.0000 mg | ORAL_TABLET | Freq: Two times a day (BID) | ORAL | Status: DC

## 2015-06-02 MED ORDER — OMEPRAZOLE 20 MG PO CPDR: DELAYED_RELEASE_CAPSULE | ORAL | Status: DC

## 2015-06-02 MED ORDER — AMOXICILLIN-POT CLAVULANATE 875-125 MG PO TABS: 1.0000 | ORAL_TABLET | Freq: Two times a day (BID) | ORAL | Status: DC

## 2015-06-02 MED ORDER — LISINOPRIL-HYDROCHLOROTHIAZIDE 20-25 MG PO TABS: ORAL_TABLET | ORAL | Status: DC

## 2015-06-02 NOTE — Assessment & Plan Note (Signed)
Refilled Wellbutrin.

## 2015-06-02 NOTE — Patient Instructions (Signed)
Thank you for coming in,   Please follow-up with me for your Pap smear and other lab work.  I have placed a referral to dentistry and please let me know if you do not hear from them within a week  Please make an appointment in order to have her tooth left at fairly soon.  Please bring all of your medications with you to each visit.   Sign up for My Chart to have easy access to your labs results, and communication with your Primary care physician   Please feel free to call with any questions or concerns at any time, at [phone]. --Dr. Raeford Razor Dental Pain Dental pain may be caused by many things, including:  Tooth decay (cavities or caries). Cavities expose the nerve of your tooth to air and hot or cold temperatures. This can cause pain or discomfort.  Abscess or infection. A dental abscess is a collection of infected pus from a bacterial infection in the inner part of the tooth (pulp). It usually occurs at the end of the tooth's root.  Injury.  An unknown reason (idiopathic). Your pain may be mild or severe. It may only occur when:  You are chewing.  You are exposed to hot or cold temperature.  You are eating or drinking sugary foods or beverages, such as soda or candy. Your pain may also be constant. HOME CARE INSTRUCTIONS Watch your dental pain for any changes. The following actions may help to lessen any discomfort that you are feeling:  Take medicines only as directed by your dentist.  If you were prescribed an antibiotic medicine, finish all of it even if you start to feel better.  Keep all follow-up visits as directed by your dentist. This is important.  Do not apply heat to the outside of your face.  Rinse your mouth or gargle with salt water if directed by your dentist. This helps with pain and swelling.  You can make salt water by adding  tsp of salt to 1 cup of warm water.  Apply ice to the painful area of your face:  Put ice in a plastic bag.  Place a  towel between your skin and the bag.  Leave the ice on for 20 minutes, 2-3 times per day.  Avoid foods or drinks that cause you pain, such as:  Very hot or very cold foods or drinks.  Sweet or sugary foods or drinks. SEEK MEDICAL CARE IF:  Your pain is not controlled with medicines.  Your symptoms are worse.  You have new symptoms. SEEK IMMEDIATE MEDICAL CARE IF:  You are unable to open your mouth.  You are having trouble breathing or swallowing.  You have a fever.  Your face, neck, or jaw is swollen.   This information is not intended to replace advice given to you by your health care provider. Make sure you discuss any questions you have with your health care provider.   Document Released: 05/23/2005 Document Revised: 10/07/2014 Document Reviewed: 05/19/2014 Elsevier Interactive Patient Education [employer].

## 2015-06-02 NOTE — Assessment & Plan Note (Signed)
Controlled - Continue current medications and refilled today - She will obtain lab work when she follows up for her Pap smear

## 2015-06-02 NOTE — Progress Notes (Signed)
Subjective:    Allison Steele - 44 y.o. female MRN [id number]  Date of birth: March 15, 1971  HPI  Allison Steele is here for tooth pain.  Tooth pain  Started two days ago  Getting worse  History of having teeth extraction  No fevers.  Last seen by dentist last year  No trouble swallowing or breathing  Did not occur with meal  Has tried goody's powder  Occuring on top right side.  Pain is intermittent in nature.  Needs a referral to dentist.   HTN Disease Monitoring: Home BP Monitoring none  Medications:lisinopril/HCTZ  Chest pain- no     Dyspnea- no Compliance-  Yes .  Lightheadedness-  no   Edema- no  Depression Needs refill of her Wellbutrin  GERD Needs refill of her omeprazole Symptomatic if not taking medication  Health Maintenance:  - Will get pap smear and HIV at different date.  Health Maintenance Due  Topic Date Due  . HIV Screening  08/21/1986  . PAP SMEAR  02/28/2015    -  reports that she has never smoked. She has never used smokeless tobacco. - Review of Systems: Per HPI. - Past Medical History: Patient Active Problem List   Diagnosis Date Noted  . Tooth pain 06/02/2015  . Burn of foot, right, second degree 05/06/2015  . Left foot pain 12/24/2014  . Esophageal reflux 05/18/2014  . Numbness and tingling in right hand 02/25/2014  . Overweight(278.02) 03/17/2010  . DEPRESSION/ANXIETY 05/13/2009  . Cutaneous sarcoidosis ([place]) 01/22/2009  . 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 HYPERTENSION, BENIGN SYSTEMIC 08/03/2006   - Medications: reviewed and updated Current Outpatient Prescriptions  Medication Sig Dispense Refill  . amoxicillin-clavulanate (AUGMENTIN) 875-125 MG tablet Take 1 tablet by mouth 2 (two) times daily. 20 tablet 0  . buPROPion (WELLBUTRIN SR) 150 MG 12 hr tablet Take 1 tablet (150 mg total) by mouth 2 (two) times daily. 60 tablet 3  . cetirizine (ZYRTEC) 10 MG tablet Take 1 tablet (10 mg total) by mouth daily. 30  tablet 11  . clobetasol cream (TEMOVATE) 0.05 %     . HYDROcodone-acetaminophen (NORCO/VICODIN) 5-325 MG tablet Take 1 tablet by mouth every 6 (six) hours as needed for moderate pain. 20 tablet 0  . hydroxychloroquine (PLAQUENIL) 200 MG tablet TAKE 1 TABLET BY MOUTH TWICE A DAY 60 tablet 0  . ipratropium (ATROVENT) 0.06 % nasal spray Place 2 sprays into both nostrils 4 (four) times daily. 15 mL 12  . lisinopril-hydrochlorothiazide (PRINZIDE,ZESTORETIC) 20-25 MG tablet TAKE 1 TABLET BY MOUTH DAILY. 90 tablet 1  . meloxicam (MOBIC) 15 MG tablet Take 1 tablet (15 mg total) by mouth daily as needed for pain. 15 tablet 0  . norethindrone-ethinyl estradiol (JUNEL FE,GILDESS FE,LOESTRIN FE) 1-20 MG-MCG tablet Take 1 tablet by mouth daily. 1 Package 11  . omeprazole (PRILOSEC) 20 MG capsule TAKE 1 CAPSULE (20 MG TOTAL) BY MOUTH DAILY. 90 capsule 1  . phenazopyridine (PYRIDIUM) 200 MG tablet Take 1 tablet (200 mg total) by mouth 3 (three) times daily. 6 tablet 0  . polyethylene glycol (GLYCOLAX) packet Take 17 g by mouth daily as needed. Add to 8 ounces fluid & take as needed for constipation      No current facility-administered medications for this visit.     Review of Systems See HPI     Objective:   Physical Exam BP 126/80 mmHg  Pulse 100  Temp(Src) 98.3 F (36.8 C) (Oral)  Ht 5\' 4"  (1.626  m)  Wt 171 lb (77.565 kg)  BMI 29.34 kg/m2  SpO2 99%  LMP 04/27/2015 (Approximate) Gen: NAD, alert, cooperative with exam HEENT: NCAT, PERRL, clear conjunctiva, oropharynx clear, supple neck, hepatic membranes clear and intact bilaterally, poor dentition, carries throughout, uvula midline, no tonsillar exudates, tender to palpation in the right cheek, no pain with eye movements CV: Mildly tachycardic, regular rhythm good S1/S2, no murmur,  Resp: CTABL, no wheezes, non-labored Skin: no rashes, normal turgor  Neuro: no gross deficits.  Psych:  alert and oriented   Assessment & Plan:    HYPERTENSION, BENIGN SYSTEMIC Controlled - Continue current medications and refilled today - She will obtain lab work when she follows up for her Pap smear  Esophageal reflux Controlled with omeprazole - Refilled today  DEPRESSION/ANXIETY Refilled Wellbutrin  Tooth pain Pain most likely secondary to acute infection of Periodontal Abscess No trouble breathing or swallowing - Norco given #20  - Augmentin 875 twice a day for 10 days - For a place for dentistry and also given sheets of information with dentist - Advised to make an appointment with the dentist for consideration of tooth extraction

## 2015-06-02 NOTE — Assessment & Plan Note (Signed)
Controlled with omeprazole - Refilled today

## 2015-06-02 NOTE — Assessment & Plan Note (Signed)
Pain most likely secondary to acute infection of Periodontal Abscess No trouble breathing or swallowing - Norco given #20  - Augmentin 875 twice a day for 10 days - For a place for dentistry and also given sheets of information with dentist - Advised to make an appointment with the dentist for consideration of tooth extraction

## 2015-09-10 ENCOUNTER — Other Ambulatory Visit (HOSPITAL_COMMUNITY)
Admission: RE | Admit: 2015-09-10 | Discharge: 2015-09-10 | Disposition: A | Discharge status: Home / Self Care | Payer: Medicare Other | Source: Ambulatory Visit | Attending: Family Medicine | Admitting: Family Medicine

## 2015-09-10 ENCOUNTER — Ambulatory Visit (INDEPENDENT_AMBULATORY_CARE_PROVIDER_SITE_OTHER): Payer: Medicare Other | Admitting: Family Medicine

## 2015-09-10 ENCOUNTER — Encounter: Payer: Self-pay | Admitting: Family Medicine

## 2015-09-10 VITALS — BP 142/88 | HR 90 | Temp 98.1°F | Ht 64.0 in | Wt 173.4 lb

## 2015-09-10 DIAGNOSIS — Z01419 Encounter for gynecological examination (general) (routine) without abnormal findings: Secondary | ICD-10-CM | POA: Insufficient documentation

## 2015-09-10 DIAGNOSIS — Z124 Encounter for screening for malignant neoplasm of cervix: Secondary | ICD-10-CM

## 2015-09-10 DIAGNOSIS — L298 Other pruritus: Secondary | ICD-10-CM

## 2015-09-10 DIAGNOSIS — K219 Gastro-esophageal reflux disease without esophagitis: Secondary | ICD-10-CM

## 2015-09-10 DIAGNOSIS — N898 Other specified noninflammatory disorders of vagina: Secondary | ICD-10-CM

## 2015-09-10 DIAGNOSIS — I10 Essential (primary) hypertension: Secondary | ICD-10-CM | POA: Diagnosis not present

## 2015-09-10 DIAGNOSIS — D863 Sarcoidosis of skin: Secondary | ICD-10-CM | POA: Diagnosis not present

## 2015-09-10 DIAGNOSIS — F329 Major depressive disorder, single episode, unspecified: Secondary | ICD-10-CM

## 2015-09-10 DIAGNOSIS — Z1151 Encounter for screening for human papillomavirus (HPV): Secondary | ICD-10-CM | POA: Diagnosis present

## 2015-09-10 DIAGNOSIS — Z0000 Encounter for general adult medical examination without abnormal findings: Secondary | ICD-10-CM

## 2015-09-10 DIAGNOSIS — Z114 Encounter for screening for human immunodeficiency virus [HIV]: Secondary | ICD-10-CM | POA: Diagnosis not present

## 2015-09-10 DIAGNOSIS — F32A Depression, unspecified: Secondary | ICD-10-CM

## 2015-09-10 LAB — POCT URINALYSIS DIPSTICK
BILIRUBIN UA: NEGATIVE
GLUCOSE UA: NEGATIVE
Ketones, UA: NEGATIVE
Leukocytes, UA: NEGATIVE
Nitrite, UA: NEGATIVE
Protein, UA: NEGATIVE
SPEC GRAV UA: 1.025
Urobilinogen, UA: 1
pH, UA: 7

## 2015-09-10 LAB — CBC
HCT: 35.8 % (ref 35.0–45.0)
HEMOGLOBIN: 11.8 g/dL (ref 11.7–15.5)
MCH: 30.1 pg (ref 27.0–33.0)
MCHC: 33 g/dL (ref 32.0–36.0)
MCV: 91.3 fL (ref 80.0–100.0)
MPV: 9 fL (ref 7.5–12.5)
PLATELETS: 322 10*3/uL (ref 140–400)
RBC: 3.92 MIL/uL (ref 3.80–5.10)
RDW: 13.6 % (ref 11.0–15.0)
WBC: 5 10*3/uL (ref 3.8–10.8)

## 2015-09-10 LAB — LIPID PANEL
CHOL/HDL RATIO: 3 ratio (ref <=5.0)
CHOLESTEROL: 163 mg/dL (ref 125–200)
HDL: 55 mg/dL (ref >=46)
LDL Cholesterol: 74 mg/dL (ref <130)
TRIGLYCERIDES: 170 mg/dL — AB (ref <150)
VLDL: 34 mg/dL — AB (ref <30)

## 2015-09-10 LAB — BASIC METABOLIC PANEL WITH GFR
BUN: 12 mg/dL (ref 7–25)
CHLORIDE: 103 mmol/L (ref 98–110)
CO2: 25 mmol/L (ref 20–31)
Calcium: 8.9 mg/dL (ref 8.6–10.2)
Creat: 0.81 mg/dL (ref 0.50–1.10)
GFR, Est African American: >89 mL/min (ref >=60)
GFR, Est Non African American: 89 mL/min (ref >=60)
Glucose, Bld: 84 mg/dL (ref 65–99)
POTASSIUM: 3.5 mmol/L (ref 3.5–5.3)
Sodium: 139 mmol/L (ref 135–146)

## 2015-09-10 LAB — POCT WET PREP (WET MOUNT): CLUE CELLS WET PREP WHIFF POC: NEGATIVE

## 2015-09-10 LAB — POCT UA - MICROSCOPIC ONLY

## 2015-09-10 LAB — HIV ANTIBODY (ROUTINE TESTING W REFLEX): HIV 1&2 Ab, 4th Generation: NONREACTIVE

## 2015-09-10 MED ORDER — LISINOPRIL-HYDROCHLOROTHIAZIDE 20-25 MG PO TABS: ORAL_TABLET | ORAL | Status: DC

## 2015-09-10 MED ORDER — BUPROPION HCL ER (SR) 150 MG PO TB12
150.0000 mg | ORAL_TABLET | Freq: Two times a day (BID) | ORAL | Status: DC
Start: 2015-09-10 — End: 2016-09-09

## 2015-09-10 MED ORDER — OMEPRAZOLE 20 MG PO CPDR: DELAYED_RELEASE_CAPSULE | ORAL | Status: DC

## 2015-09-10 NOTE — Assessment & Plan Note (Addendum)
She sees optometry once a year for her optic neuropathy Is followed by dermatology for her cutaneous sarcoidosis Never smoker Pap smear completed today Had discussion about initiating mammograms but will postpone until she is 45 years old Screen for HIV - Follow-up in one year

## 2015-09-10 NOTE — Progress Notes (Signed)
   Subjective:    Allison Steele - 45 y.o. female MRN [id number]  Date of birth: 12-07-1970  CC Pap smear   HPI  Allison Steele is here for pap smear.  Annual Gynecological Exam  [id number]  Wt Readings from Last 3 Encounters:  09/10/15 173 lb 6.4 oz (78.654 kg)  06/02/15 171 lb (77.565 kg)  05/06/15 174 lb 5 oz (79.068 kg)   Last period: 08/26/2015 Regular periods: yes Heavy bleeding: yes. Has been intermittent   Sexually active: no Birth control or hormonal therapy: no Hx of STD: no Dyspareunia: N/a  Hot flashes: No Vaginal discharge: received PCN for her tooth being pulled and feels she has a yeast infection   Dysuria: No   Last mammogram: never had one previously  Breast mass or concerns: No  Last Pap: 2013 History of abnormal pap: No   FH of breast, uterine, ovarian, colon cancer: No  HTN Disease Monitoring: Home BP Monitoring yes at home   Medications:lisinopril/HCTZ  Chest pain- no     Dyspnea- no Compliance-  Yes .  Lightheadedness-  no   Edema- no  Cutaneous Sarcoidosis:  Has not have a break out in over a year.  She is seen by a dermatologist and recommended to follow up in one year.  CXR in 2014 showed no active disease.  She usually has break outs on her face and on her arms.  Is on plaqunil currently   SH: never smoker  PMH: Leber's hereditary optic neuropathy, hypertension, cutaneous sarcoidosis  Health Maintenance:  Health Maintenance Due  Topic Date Due  . HIV Screening  08/21/1986  . PAP SMEAR  02/28/2015    Review of Systems See HPI     Objective:   Physical Exam BP 142/88 mmHg  Pulse 90  Temp(Src) 98.1 F (36.7 C) (Oral)  Ht 5\' 4"  (1.626 m)  Wt 173 lb 6.4 oz (78.654 kg)  BMI 29.75 kg/m2  LMP 08/26/2015 Gen: NAD, alert, cooperative with exam HEENT: NCAT,clear conjunctiva, supple neck CV: RRR, good S1/S2, no murmur, no edema,   Resp: CTABL, no wheezes, non-labored Abd: SNTND, BS present, no guarding or  organomegaly Skin: no rashes, normal turgor  Neuro: no gross deficits.  Psych: alert and oriented GU: External: no lesions Vagina: no blood in vault Cervix: no lesion; no mucopurulent d/c    Assessment & Plan:   HYPERTENSION, BENIGN SYSTEMIC Near control  - refill medications  - lab work today    Annual physical exam She sees optometry once a year for her optic neuropathy Is followed by dermatology for her cutaneous sarcoidosis Never smoker Pap smear completed today Had discussion about initiating mammograms but will postpone until she is 45 years old Screen for HIV - Follow-up in one year  Cutaneous sarcoidosis Well-controlled Is followed by dermatology once a year - Currently taking Plaquenil and has a topical steroid for flares  Vaginal itching Most likely she has a yeast infection from penicillin use for her tooth removal. - Wet prep performed today

## 2015-09-10 NOTE — Assessment & Plan Note (Signed)
Most likely she has a yeast infection from penicillin use for her tooth removal. - Wet prep performed today

## 2015-09-10 NOTE — Assessment & Plan Note (Signed)
Near control  - refill medications  - lab work today

## 2015-09-10 NOTE — Addendum Note (Signed)
Addended by: [place] Pink on: 09/10/2015 03:41 PM   Modules accepted: Orders, SmartSet

## 2015-09-10 NOTE — Patient Instructions (Signed)
Thank you for coming in,   I will call or send a letter with the results from today.   Please bring all of your medications with you to each visit.   Sign up for My Chart to have easy access to your labs results, and communication with your Primary care physician   Please feel free to call with any questions or concerns at any time, at [phone]. --Dr. Raeford Razor  Diet Recommendations for hypertension  Starchy (carb) foods include: Bread, rice, pasta, potatoes, corn, crackers, bagels, muffins, all baked goods.   Protein foods include: Meat, fish, poultry, eggs, dairy foods, and beans such as pinto and kidney beans (beans also provide carbohydrate).   1. Eat at least 3 meals and 1-2 snacks per day. Never go more than 4-5 hours while awake without eating.  2. Limit starchy foods to TWO per meal and ONE per snack. ONE portion of a starchy  food is equal to the following:   - ONE slice of bread (or its equivalent, such as half of a hamburger bun).   - 1/2 cup of a "scoopable" starchy food such as potatoes or rice.   - 1 OUNCE (28 grams) of starchy snack foods such as crackers or pretzels (look on label).   - 15 grams of carbohydrate as shown on food label.  3. Both lunch and dinner should include a protein food, a carb food, and vegetables.   - Obtain twice as many veg's as protein or carbohydrate foods for both lunch and dinner.   - Try to keep frozen veg's on hand for a quick vegetable serving.     - Fresh or frozen veg's are best.  4. Breakfast should always include protein.

## 2015-09-10 NOTE — Assessment & Plan Note (Signed)
Well-controlled Is followed by dermatology once a year - Currently taking Plaquenil and has a topical steroid for flares

## 2015-09-11 LAB — CYTOLOGY - PAP

## 2015-09-15 ENCOUNTER — Telehealth: Payer: Self-pay | Admitting: Family Medicine

## 2015-09-15 ENCOUNTER — Encounter: Payer: Self-pay | Admitting: Family Medicine

## 2015-09-15 MED ORDER — FLUCONAZOLE 150 MG PO TABS: 150.0000 mg | ORAL_TABLET | Freq: Once | ORAL | Status: DC

## 2015-09-15 NOTE — Telephone Encounter (Signed)
Pt informed of below. Zimmerman Rumple, Matti Killingsworth D, CMA  

## 2015-09-15 NOTE — Telephone Encounter (Signed)
Left VM for patient. If she calls back please have her speak with a nurse/CMA and inform that her wet prep showed yeast so I sent in diflucan. Her pap smear was normal and her other lab testing was normal.   If any questions then please take the best time and phone number to call and I will try to call her back.   Rosemarie Ax, MD PGY-3, [facility] 09/15/2015, 11:00 AM

## 2016-02-22 ENCOUNTER — Telehealth: Payer: Self-pay | Admitting: Family Medicine

## 2016-02-22 NOTE — Telephone Encounter (Signed)
Returned patient's call, no answer. Left voicemail that she can try Flonase or Claritin. If she would like to be seen for this problem she can schedule an appointment and I will be more than happy to discuss this further.

## 2016-02-22 NOTE — Telephone Encounter (Signed)
Face below her eye on her right side is hurting.  She is congested at night.  Pt thinks it is is sinus related. Kassie Mends can she take OTC?

## 2016-02-23 ENCOUNTER — Ambulatory Visit (INDEPENDENT_AMBULATORY_CARE_PROVIDER_SITE_OTHER): Payer: Medicare Other | Admitting: Obstetrics and Gynecology

## 2016-02-23 ENCOUNTER — Encounter: Payer: Self-pay | Admitting: Obstetrics and Gynecology

## 2016-02-23 VITALS — BP 132/83 | Temp 99.6°F | Wt 174.0 lb

## 2016-02-23 DIAGNOSIS — J0100 Acute maxillary sinusitis, unspecified: Secondary | ICD-10-CM | POA: Diagnosis not present

## 2016-02-23 DIAGNOSIS — Z23 Encounter for immunization: Secondary | ICD-10-CM | POA: Diagnosis not present

## 2016-02-23 MED ORDER — AMOXICILLIN-POT CLAVULANATE 875-125 MG PO TABS: 1.0000 | ORAL_TABLET | Freq: Two times a day (BID) | ORAL | 0 refills | Status: DC

## 2016-02-23 MED ORDER — SALINE SPRAY 0.65 % NA SOLN: 2.0000 | NASAL | 0 refills | Status: DC | PRN

## 2016-02-23 MED ORDER — FLUCONAZOLE 150 MG PO TABS: 150.0000 mg | ORAL_TABLET | Freq: Once | ORAL | 0 refills | Status: AC

## 2016-02-23 NOTE — Progress Notes (Signed)
   Subjective:   Patient ID: Allison Steele, female    DOB: [birth year], 45 y.o.   MRN: [id number]  Patient presents for Same Day Appointment  Chief Complaint  Patient presents with  . Facial Pain    HPI: # Face Pain: Location: right side Pain started on Saturday  Medications tried: none Recent trauma: no Antibiotics in the last 30 days: no States that now she is not eating because she can't open her mouth H/o sinus infection last year Pain starts around her right maxillary sinus but then shoots to her eye and ear causing pain   Symptoms Ear discharge: no Fever: no Pain with chewing: no Ringing in ears: no Dizziness: no Headache: no Congestion: minimal   Review of Systems   See HPI for ROS.   Smoking status - Never Smoker  Past medical history, surgical, family, and social history reviewed and updated in the EMR as appropriate.  Objective:  BP 132/83   Temp 99.6 F (37.6 C) (Oral)   Wt 174 lb (78.9 kg)   LMP 02/13/2016   BMI 29.87 kg/m  Vitals and nursing note reviewed  Physical Exam  Constitutional: She is well-developed, well-nourished, and in no distress.  HENT:  Head: Normocephalic and atraumatic.  Right Ear: Hearing, tympanic membrane, external ear and ear canal normal.  Left Ear: Hearing, tympanic membrane, external ear and ear canal normal.  Nose: Right sinus exhibits maxillary sinus tenderness.  Mouth/Throat: Oropharynx is clear and moist and mucous membranes are normal. No oropharyngeal exudate.  Cerumen occluding large part of canals  Eyes: Conjunctivae and EOM are normal. Pupils are equal, round, and reactive to light.  Neck: Normal range of motion. Neck supple. No thyromegaly present.  Lymphadenopathy:    She has no cervical adenopathy.    Assessment & Plan:  1. Acute maxillary sinusitis.  Symptoms and signs consistent with right-sided maxillary sinusitis. Patient with low-grade fever in clinic. Vitals are stable. No red flags. Will treat  with Augmentin and empirically give fluconazole for yeast infection. Prn OTC pain medications. Nasal saline wash prescribed. Return precatuions given along with handout.    Orders Placed This Encounter  Procedures  . Flu Vaccine QUAD 36+ mos IM     Meds ordered this encounter  Medications  . amoxicillin-clavulanate (AUGMENTIN) 875-125 MG tablet    Sig: Take 1 tablet by mouth 2 (two) times daily.    Dispense:  20 tablet    Refill:  0  . fluconazole (DIFLUCAN) 150 MG tablet    Sig: Take 1 tablet (150 mg total) by mouth once.    Dispense:  1 tablet    Refill:  0  . sodium chloride (OCEAN) 0.65 % SOLN nasal spray    Sig: Place 2 sprays into both nostrils as needed for congestion.    Dispense:  1 Bottle    Refill:  [facility], DO 02/23/2016, 3:37 PM PGY-3, [place]

## 2016-02-23 NOTE — Patient Instructions (Signed)
Sinusitis, Adult  Sinusitis is redness, soreness, and puffiness (inflammation) of the air pockets in the bones of your face (sinuses). The redness, soreness, and puffiness can cause air and mucus to get trapped in your sinuses. This can allow germs to grow and cause an infection.   HOME CARE    Drink enough fluids to keep your pee (urine) clear or pale yellow.   Use a humidifier in your home.   Run a hot shower to create steam in the bathroom. Sit in the bathroom with the door closed. Breathe in the steam 3-4 times a day.   Put a warm, moist washcloth on your face 3-4 times a day, or as told by your doctor.   Use salt water sprays (saline sprays) to wet the thick fluid in your nose. This can help the sinuses drain.   Only take medicine as told by your doctor.  GET HELP RIGHT AWAY IF:    Your pain gets worse.   You have very bad headaches.   You are sick to your stomach (nauseous).   You throw up (vomit).   You are very sleepy (drowsy) all the time.   Your face is puffy (swollen).   Your vision changes.   You have a stiff neck.   You have trouble breathing.  MAKE SURE YOU:    Understand these instructions.   Will watch your condition.   Will get help right away if you are not doing well or get worse.     This information is not intended to replace advice given to you by your health care provider. Make sure you discuss any questions you have with your health care provider.     Document Released: 11/09/2007 Document Revised: 06/13/2014 Document Reviewed: 12/27/2011  Elsevier Interactive Patient Education 2016 Elsevier Inc.

## 2016-03-03 ENCOUNTER — Emergency Department (HOSPITAL_COMMUNITY)
Admission: EM | Admit: 2016-03-03 | Discharge: 2016-03-03 | Disposition: A | Discharge status: Home / Self Care | Payer: Medicare Other | Attending: Emergency Medicine | Admitting: Emergency Medicine

## 2016-03-03 ENCOUNTER — Encounter (HOSPITAL_COMMUNITY): Payer: Self-pay | Admitting: Emergency Medicine

## 2016-03-03 ENCOUNTER — Emergency Department (HOSPITAL_COMMUNITY): Payer: Medicare Other

## 2016-03-03 DIAGNOSIS — Y929 Unspecified place or not applicable: Secondary | ICD-10-CM | POA: Diagnosis not present

## 2016-03-03 DIAGNOSIS — W0110XA Fall on same level from slipping, tripping and stumbling with subsequent striking against unspecified object, initial encounter: Secondary | ICD-10-CM | POA: Insufficient documentation

## 2016-03-03 DIAGNOSIS — S0011XA Contusion of right eyelid and periocular area, initial encounter: Secondary | ICD-10-CM | POA: Diagnosis not present

## 2016-03-03 DIAGNOSIS — S0083XA Contusion of other part of head, initial encounter: Secondary | ICD-10-CM | POA: Diagnosis not present

## 2016-03-03 DIAGNOSIS — I10 Essential (primary) hypertension: Secondary | ICD-10-CM | POA: Diagnosis not present

## 2016-03-03 DIAGNOSIS — Z79899 Other long term (current) drug therapy: Secondary | ICD-10-CM | POA: Diagnosis not present

## 2016-03-03 DIAGNOSIS — S0081XA Abrasion of other part of head, initial encounter: Secondary | ICD-10-CM

## 2016-03-03 DIAGNOSIS — Y939 Activity, unspecified: Secondary | ICD-10-CM | POA: Insufficient documentation

## 2016-03-03 DIAGNOSIS — S0031XA Abrasion of nose, initial encounter: Secondary | ICD-10-CM | POA: Insufficient documentation

## 2016-03-03 DIAGNOSIS — S0993XA Unspecified injury of face, initial encounter: Secondary | ICD-10-CM | POA: Diagnosis present

## 2016-03-03 DIAGNOSIS — Y999 Unspecified external cause status: Secondary | ICD-10-CM | POA: Insufficient documentation

## 2016-03-03 DIAGNOSIS — S0990XA Unspecified injury of head, initial encounter: Secondary | ICD-10-CM | POA: Diagnosis not present

## 2016-03-03 DIAGNOSIS — W19XXXA Unspecified fall, initial encounter: Secondary | ICD-10-CM

## 2016-03-03 MED ORDER — OXYCODONE-ACETAMINOPHEN 5-325 MG PO TABS
1.0000 | ORAL_TABLET | Freq: Once | ORAL | Status: AC
  Administered 2016-03-03: 1 via ORAL
  Filled 2016-03-03: qty 1

## 2016-03-03 MED ORDER — OXYCODONE-ACETAMINOPHEN 5-325 MG PO TABS: 1.0000 | ORAL_TABLET | ORAL | Status: DC | PRN

## 2016-03-03 MED ORDER — TETANUS-DIPHTH-ACELL PERTUSSIS 5-2.5-18.5 LF-MCG/0.5 IM SUSP
0.5000 mL | Freq: Once | INTRAMUSCULAR | Status: AC
  Administered 2016-03-03: 0.5 mL via INTRAMUSCULAR
  Filled 2016-03-03: qty 0.5

## 2016-03-03 MED ORDER — OXYCODONE-ACETAMINOPHEN 5-325 MG PO TABS
ORAL_TABLET | ORAL | Status: AC
  Administered 2016-03-03: 1
  Filled 2016-03-03: qty 1

## 2016-03-03 MED ORDER — OXYCODONE-ACETAMINOPHEN 5-325 MG PO TABS: 1.0000 | ORAL_TABLET | Freq: Four times a day (QID) | ORAL | 0 refills | Status: DC | PRN

## 2016-03-03 NOTE — ED Triage Notes (Addendum)
Pt st's she tripped and fell on curb.  Pt has large hematoma to right forehead with abrasion.  Also abrasion to right side of face under right eye and right upper lip.  Pt denies LOC. Pt is not taking any blood thinners.  Ice pack given to pt. In triage

## 2016-03-03 NOTE — ED Provider Notes (Signed)
[name] DEPT Provider Note   CSN: [id number] Arrival date & time: 03/03/16  1719     History   Chief Complaint Chief Complaint  Patient presents with  . Fall    HPI Allison Steele is a 45 y.o. female.   Fall  Pertinent negatives include no chest pain, no abdominal pain and no headaches.  Patient presents after a fall. She states she is legally blind and is looking at her phone and tripped on a curb. [place] striking the right side of her face. No loss conscious. She is not on anticoagulation. States the swelling was worse earlier but has gone down somewhat since arriving in the ER since she's had ice on it. No vision changes. Last tetanus was 8 years ago.  Past Medical History:  Diagnosis Date  . Constipation   . Depression   . GERD (gastroesophageal reflux disease)   . Hypertension   . Implanon in place 04/05/10  . 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 Loss of central vision bilaterally.     . 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 Sarcoidosis [facility])     - followed by Dr. Delman Cheadle, [facility], [phone]    Patient Active Problem List   Diagnosis Date Noted  . Acute maxillary sinusitis 02/23/2016  . Annual physical exam 09/10/2015  . Burn of foot, right, second degree 05/06/2015  . Left foot pain 12/24/2014  . Esophageal reflux 05/18/2014  . Numbness and tingling in right hand 02/25/2014  . Overweight(278.02) 03/17/2010  . DEPRESSION/ANXIETY 05/13/2009  . 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 HYPERTENSION, BENIGN SYSTEMIC 08/03/2006    Past Surgical History:  Procedure Laterality Date  . CESAREAN SECTION      OB History    No data available       Home Medications    Prior to Admission medications   Medication Sig Start Date End Date Taking?  Authorizing Provider  amoxicillin-clavulanate (AUGMENTIN) 875-125 MG tablet Take 1 tablet by mouth 2 (two) times daily. 02/23/16   Katheren Shams, DO  buPROPion (WELLBUTRIN SR) 150 MG 12 hr tablet Take 1 tablet (150 mg total) by mouth 2 (two) times daily. 09/10/15   Rosemarie Ax, MD  cetirizine (ZYRTEC) 10 MG tablet Take 1 tablet (10 mg total) by mouth daily. 09/14/12   Angelica Ran, MD  clobetasol cream (TEMOVATE) 0.05 %  02/20/12   Historical Provider, MD  HYDROcodone-acetaminophen (NORCO/VICODIN) 5-325 MG tablet Take 1 tablet by mouth every 6 (six) hours as needed for moderate pain. 06/02/15   Rosemarie Ax, MD  hydroxychloroquine (PLAQUENIL) 200 MG tablet TAKE 1 TABLET BY MOUTH TWICE A DAY 07/18/11   Alveda Reasons, MD  ipratropium (ATROVENT) 0.06 % nasal spray Place 2 sprays into both nostrils 4 (four) times daily. 03/05/14   Harden Mo, MD  lisinopril-hydrochlorothiazide (PRINZIDE,ZESTORETIC) 20-25 MG tablet TAKE 1 TABLET BY MOUTH DAILY. 09/10/15   Rosemarie Ax, MD  meloxicam (MOBIC) 15 MG tablet Take 1 tablet (15 mg total) by mouth daily as needed for pain. 01/09/15   Rosemarie Ax, MD  norethindrone-ethinyl estradiol (JUNEL FE,GILDESS FE,LOESTRIN FE) 1-20 MG-MCG tablet Take 1 tablet by mouth daily. 05/29/13   Angelica Ran, MD  omeprazole (PRILOSEC) 20 MG capsule TAKE 1 CAPSULE (20 MG TOTAL) BY MOUTH  DAILY. 09/10/15   Rosemarie Ax, MD  phenazopyridine (PYRIDIUM) 200 MG tablet Take 1 tablet (200 mg total) by mouth 3 (three) times daily. 05/11/13   Freeman Caldron Baker, PA-C  polyethylene glycol [facility]) packet Take 17 g by mouth daily as needed. Add to 8 ounces fluid & take as needed for constipation     Historical Provider, MD  sodium chloride (OCEAN) 0.65 % SOLN nasal spray Place 2 sprays into both nostrils as needed for congestion. 02/23/16   Katheren Shams, DO    Family History Family History  Problem Relation Age of Onset  . Cancer Mother   . Hypertension Mother     . Alcohol abuse Father     Social History Social History  Substance Use Topics  . Smoking status: Never Smoker  . Smokeless tobacco: Never Used  . Alcohol use No     Allergies   Review of patient's allergies indicates no known allergies.   Review of Systems Review of Systems  Constitutional: Negative for appetite change.  Eyes: Negative for visual disturbance.  Respiratory: Negative for chest tightness.   Cardiovascular: Negative for chest pain.  Gastrointestinal: Negative for abdominal pain.  Genitourinary: Negative for flank pain.  Musculoskeletal: Negative for back pain.  Skin: Positive for wound.  Neurological: Negative for headaches.  Hematological: Does not bruise/bleed easily.  Psychiatric/Behavioral: Negative for confusion.     Physical Exam Updated Vital Signs BP 133/81   Pulse 78   Temp 98.4 F (36.9 C) (Oral)   Resp 18   Ht 5\' 4"  (1.626 m)   Wt 174 lb (78.9 kg)   LMP 02/11/2016   SpO2 100%   BMI 29.87 kg/m   Physical Exam  Constitutional: She appears well-developed.  HENT:  Hematoma to right forehead with abrasion. Also hematoma of right eyelid. Some abrasions below near on right side of nose. Stable teeth. Eye movements intact.  Eyes: EOM are normal.  Neck: Neck supple.  Cardiovascular: Normal rate.   Pulmonary/Chest: Effort normal.  Abdominal: Soft. There is no tenderness.  Musculoskeletal: Normal range of motion. She exhibits no edema.  Neurological: She is alert.  Skin: Skin is warm. Capillary refill takes less than 2 seconds.  Psychiatric: She has a normal mood and affect.     ED Treatments / Results  Labs (all labs ordered are listed, but only abnormal results are displayed) Labs Reviewed - No data to display  EKG  EKG Interpretation None       Radiology Ct Head Wo Contrast  Result Date: 03/03/2016 CLINICAL DATA:  Tripped and fell on curb, with right forehead hematoma and abrasion. Initial encounter. EXAM: CT HEAD WITHOUT  CONTRAST CT MAXILLOFACIAL WITHOUT CONTRAST TECHNIQUE: Multidetector CT imaging of the head and maxillofacial structures were performed using the standard protocol without intravenous contrast. Multiplanar CT image reconstructions of the maxillofacial structures were also generated. COMPARISON:  None. FINDINGS: CT HEAD FINDINGS Brain: No evidence of acute infarction, hemorrhage, hydrocephalus, extra-axial collection or mass lesion/mass effect. The posterior fossa, including the cerebellum, brainstem and fourth ventricle, is within normal limits. The third and lateral ventricles, and basal ganglia are unremarkable in appearance. The cerebral hemispheres are symmetric in appearance, with normal gray-white differentiation. No mass effect or midline shift is seen. Vascular: No hyperdense vessel or unexpected calcification. Skull: There is no evidence of fracture; visualized osseous structures are unremarkable in appearance. Sinuses/Orbits: The orbits are within normal limits. The paranasal sinuses and mastoid air cells are well-aerated. Other: Soft tissue swelling  is noted overlying the right frontal calvarium and surrounding the right orbit, tracking over the right maxilla. CT MAXILLOFACIAL FINDINGS Osseous: There is no evidence of fracture or dislocation. The maxilla and mandible appear intact. The nasal bone is unremarkable in appearance. The visualized dentition demonstrates no acute abnormality. Orbits: The orbits are intact bilaterally. Sinuses: Mucosal thickening is noted at the maxillary sinuses bilaterally. The remaining visualized paranasal sinuses and mastoid air cells are well-aerated. Soft tissues: Soft tissue swelling is noted tracking about the right orbit, and overlying the right frontal calvarium. Soft tissue swelling extends over the right maxilla. The parapharyngeal fat planes are preserved. The nasopharynx, oropharynx and hypopharynx are unremarkable in appearance. The visualized portions of the  valleculae and piriform sinuses are grossly unremarkable. The parotid and submandibular glands are within normal limits. No cervical lymphadenopathy is seen. IMPRESSION: 1. No evidence of traumatic intracranial injury or fracture. 2. No evidence of fracture or dislocation with regard to the maxillofacial structures. 3. Soft tissue swelling overlying the right frontal calvarium and surrounding the right orbit, tracking over the right maxilla. 4. Mucosal thickening at the maxillary sinuses bilaterally. Electronically Signed   By: Garald Balding M.D.   On: 03/03/2016 22:10   Ct Maxillofacial Wo Contrast  Result Date: 03/03/2016 CLINICAL DATA:  Tripped and fell on curb, with right forehead hematoma and abrasion. Initial encounter. EXAM: CT HEAD WITHOUT CONTRAST CT MAXILLOFACIAL WITHOUT CONTRAST TECHNIQUE: Multidetector CT imaging of the head and maxillofacial structures were performed using the standard protocol without intravenous contrast. Multiplanar CT image reconstructions of the maxillofacial structures were also generated. COMPARISON:  None. FINDINGS: CT HEAD FINDINGS Brain: No evidence of acute infarction, hemorrhage, hydrocephalus, extra-axial collection or mass lesion/mass effect. The posterior fossa, including the cerebellum, brainstem and fourth ventricle, is within normal limits. The third and lateral ventricles, and basal ganglia are unremarkable in appearance. The cerebral hemispheres are symmetric in appearance, with normal gray-white differentiation. No mass effect or midline shift is seen. Vascular: No hyperdense vessel or unexpected calcification. Skull: There is no evidence of fracture; visualized osseous structures are unremarkable in appearance. Sinuses/Orbits: The orbits are within normal limits. The paranasal sinuses and mastoid air cells are well-aerated. Other: Soft tissue swelling is noted overlying the right frontal calvarium and surrounding the right orbit, tracking over the right  maxilla. CT MAXILLOFACIAL FINDINGS Osseous: There is no evidence of fracture or dislocation. The maxilla and mandible appear intact. The nasal bone is unremarkable in appearance. The visualized dentition demonstrates no acute abnormality. Orbits: The orbits are intact bilaterally. Sinuses: Mucosal thickening is noted at the maxillary sinuses bilaterally. The remaining visualized paranasal sinuses and mastoid air cells are well-aerated. Soft tissues: Soft tissue swelling is noted tracking about the right orbit, and overlying the right frontal calvarium. Soft tissue swelling extends over the right maxilla. The parapharyngeal fat planes are preserved. The nasopharynx, oropharynx and hypopharynx are unremarkable in appearance. The visualized portions of the valleculae and piriform sinuses are grossly unremarkable. The parotid and submandibular glands are within normal limits. No cervical lymphadenopathy is seen. IMPRESSION: 1. No evidence of traumatic intracranial injury or fracture. 2. No evidence of fracture or dislocation with regard to the maxillofacial structures. 3. Soft tissue swelling overlying the right frontal calvarium and surrounding the right orbit, tracking over the right maxilla. 4. Mucosal thickening at the maxillary sinuses bilaterally. Electronically Signed   By: Garald Balding M.D.   On: 03/03/2016 22:10    Procedures Procedures (including critical care time)  Medications Ordered  in ED Medications  oxyCODONE-acetaminophen (PERCOCET/ROXICET) 5-325 MG per tablet 1 tablet (not administered)  oxyCODONE-acetaminophen (PERCOCET/ROXICET) 5-325 MG per tablet (1 tablet  Given 03/03/16 1828)  Tdap (BOOSTRIX) injection 0.5 mL (0.5 mLs Intramuscular Given 03/03/16 2132)  oxyCODONE-acetaminophen (PERCOCET/ROXICET) 5-325 MG per tablet 1 tablet (1 tablet Oral Given 03/03/16 2140)     Initial Impression / Assessment and Plan / ED Course  I have reviewed the triage vital signs and the nursing  notes.  Pertinent labs & imaging results that were available during my care of the patient were reviewed by me and considered in my medical decision making (see chart for details).  Clinical Course  Patient with fall. Negative imaging. Mechanical fall. Discharge home.  Final Clinical Impressions(s) / ED Diagnoses   Final diagnoses:  Fall, initial encounter  Facial contusion, initial encounter  Facial abrasion, initial encounter    New Prescriptions New Prescriptions   No medications on file     Davonna Belling, MD 03/03/16 2229

## 2016-03-11 ENCOUNTER — Ambulatory Visit (INDEPENDENT_AMBULATORY_CARE_PROVIDER_SITE_OTHER): Payer: Medicare Other | Admitting: Internal Medicine

## 2016-03-11 DIAGNOSIS — W101XXA Fall (on)(from) sidewalk curb, initial encounter: Secondary | ICD-10-CM

## 2016-03-11 DIAGNOSIS — W19XXXA Unspecified fall, initial encounter: Secondary | ICD-10-CM | POA: Insufficient documentation

## 2016-03-11 DIAGNOSIS — S0081XA Abrasion of other part of head, initial encounter: Secondary | ICD-10-CM

## 2016-03-11 DIAGNOSIS — W19XXXD Unspecified fall, subsequent encounter: Secondary | ICD-10-CM

## 2016-03-11 NOTE — Assessment & Plan Note (Signed)
Well healing abrasions to face. Although patient reports an episode of lightheadedness with standing there were no red flags on history and neuro exam benign. Discussed that NSAIDs are best to treat her pain and inflammation and that further course of narcotics would not be appropriate for well healing injuries over 1 week ago.  -neuro return precautions discussed  -NSAIDs and ice for pain control  -return prn

## 2016-03-11 NOTE — Progress Notes (Signed)
   Subjective:    Allison Steele - 45 y.o. female MRN [id number]  Date of birth: 02/10/1971  HPI  Allison Steele is here for ED follow up from fall.  Fall: Occurred on 9/28. Mechanical fall from tripping on curb. Patient is legally blind and was texting when she fell. No LOC. Seen in ED for hematoma and abrasions of the right face. CT head was negative. She was given Oxycodone which she has taken all of. Has been taking Ibuprofen for pain.   States today she feels pretty good. Still has a knot above her right eye along her eyebrow that is painful to her. Has gotten light-headed once since her fall when she was standing for a long period of time at work. This also coincided with the day her menstrual period started. Denies room spinning sensation or LOC at the time of her lightheadedness. Denies weakness, denies SOB and chest pain. Denies vision changes and severe headaches. Denies confusion or difficulty with speech/word finding.    -  reports that she has never smoked. She has never used smokeless tobacco. - Review of Systems: Per HPI. - Past Medical History: Patient Active Problem List   Diagnosis Date Noted  . Fall 03/11/2016  . Acute maxillary sinusitis 02/23/2016  . Annual physical exam 09/10/2015  . Burn of foot, right, second degree 05/06/2015  . Left foot pain 12/24/2014  . Esophageal reflux 05/18/2014  . Numbness and tingling in right hand 02/25/2014  . Overweight(278.02) 03/17/2010  . DEPRESSION/ANXIETY 05/13/2009  . 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 HYPERTENSION, BENIGN SYSTEMIC 08/03/2006   - Medications: reviewed and updated    Objective:   Physical Exam BP 122/83 (BP Location: Left Arm, Patient Position: Sitting, Cuff Size: Normal)   Pulse 90   Temp 98.3 F (36.8 C) (Oral)   Wt 170 lb 6.4 oz (77.3 kg)   LMP 03/11/2016 (Exact Date)   SpO2 99%   BMI 29.25 kg/m  Gen: NAD, alert, cooperative with exam,  well-appearing HEENT: soft tissue swelling above right eyebrow, well healing abrasions underneath right eye and below the right side of her nose, EOMI, PERRL  CV: RRR, good S1/S2, no murmur, Resp: CTABL, no wheezes, non-labored Neuro: CN grossly intact. Strength 5/5 in all extremities. Speech clear. Alert and oriented.      Assessment & Plan:   Fall Well healing abrasions to face. Although patient reports an episode of lightheadedness with standing there were no red flags on history and neuro exam benign. Discussed that NSAIDs are best to treat her pain and inflammation and that further course of narcotics would not be appropriate for well healing injuries over 1 week ago.  -neuro return precautions discussed  -NSAIDs and ice for pain control  -return prn     Phill Myron, D.O. 03/11/2016, 8:59 AM PGY-2, [place]

## 2016-03-11 NOTE — Patient Instructions (Signed)
Anti-inflammatory medications such as Ibuprofen and Naproxen are the best medications to help with pain and healing your injuries.   Make sure you keep yourself well hydrated especially at work.   If you have new weakness, severe headache, changes in vision pass out, or have changes in speech/confusion you need to be seen again.

## 2016-03-25 ENCOUNTER — Other Ambulatory Visit: Payer: Self-pay | Admitting: *Deleted

## 2016-03-25 DIAGNOSIS — K219 Gastro-esophageal reflux disease without esophagitis: Secondary | ICD-10-CM

## 2016-03-25 MED ORDER — OMEPRAZOLE 20 MG PO CPDR: DELAYED_RELEASE_CAPSULE | ORAL | 3 refills | Status: DC

## 2016-04-06 ENCOUNTER — Other Ambulatory Visit: Payer: Self-pay | Admitting: *Deleted

## 2016-04-06 DIAGNOSIS — I10 Essential (primary) hypertension: Secondary | ICD-10-CM

## 2016-04-07 MED ORDER — LISINOPRIL-HYDROCHLOROTHIAZIDE 20-25 MG PO TABS: ORAL_TABLET | ORAL | 3 refills | Status: DC

## 2016-07-19 ENCOUNTER — Other Ambulatory Visit: Payer: Self-pay | Admitting: Family Medicine

## 2016-07-19 DIAGNOSIS — K219 Gastro-esophageal reflux disease without esophagitis: Secondary | ICD-10-CM

## 2016-08-03 ENCOUNTER — Other Ambulatory Visit: Payer: Self-pay | Admitting: Family Medicine

## 2016-08-03 DIAGNOSIS — I10 Essential (primary) hypertension: Secondary | ICD-10-CM

## 2016-09-09 ENCOUNTER — Encounter: Payer: Self-pay | Admitting: Family Medicine

## 2016-09-09 ENCOUNTER — Ambulatory Visit (INDEPENDENT_AMBULATORY_CARE_PROVIDER_SITE_OTHER): Payer: Medicare Other | Admitting: Family Medicine

## 2016-09-09 DIAGNOSIS — I10 Essential (primary) hypertension: Secondary | ICD-10-CM | POA: Diagnosis not present

## 2016-09-09 DIAGNOSIS — Z309 Encounter for contraceptive management, unspecified: Secondary | ICD-10-CM | POA: Diagnosis not present

## 2016-09-09 DIAGNOSIS — F32A Depression, unspecified: Secondary | ICD-10-CM

## 2016-09-09 DIAGNOSIS — F329 Major depressive disorder, single episode, unspecified: Secondary | ICD-10-CM | POA: Diagnosis not present

## 2016-09-09 DIAGNOSIS — K219 Gastro-esophageal reflux disease without esophagitis: Secondary | ICD-10-CM

## 2016-09-09 DIAGNOSIS — G4700 Insomnia, unspecified: Secondary | ICD-10-CM | POA: Diagnosis not present

## 2016-09-09 MED ORDER — BUPROPION HCL ER (SR) 150 MG PO TB12: 150.0000 mg | ORAL_TABLET | Freq: Two times a day (BID) | ORAL | 3 refills | Status: DC

## 2016-09-09 MED ORDER — MELATONIN 3 MG PO TABS: 1.0000 | ORAL_TABLET | Freq: Every day | ORAL | 3 refills | Status: DC

## 2016-09-09 MED ORDER — LISINOPRIL-HYDROCHLOROTHIAZIDE 20-25 MG PO TABS: 1.0000 | ORAL_TABLET | Freq: Every day | ORAL | 0 refills | Status: DC

## 2016-09-09 MED ORDER — OMEPRAZOLE 20 MG PO CPDR: DELAYED_RELEASE_CAPSULE | ORAL | 3 refills | Status: DC

## 2016-09-09 MED ORDER — POLYETHYLENE GLYCOL 3350 17 G PO PACK: 17.0000 g | PACK | Freq: Every day | ORAL | 10 refills | Status: DC | PRN

## 2016-09-09 MED ORDER — NORETHIN ACE-ETH ESTRAD-FE 1-20 MG-MCG PO TABS: 1.0000 | ORAL_TABLET | Freq: Every day | ORAL | 11 refills | Status: DC
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
## 2016-09-09 NOTE — Patient Instructions (Signed)
Thank you for coming in today, it was so nice to see you! Today we talked about:    Sleep: Please take 1 pill of melatonin every night about a half hour before he would like to go to sleep.  I would like you to return in 2-4 weeks for follow-up and we will need to do blood work at that time. You can schedule this appointment at the front desk before you leave or call the clinic.  Bring in all your medications or supplements to each appointment for review.   If you have any questions or concerns, please do not hesitate to call the office at [phone]. You can also message me directly via MyChart.   Sincerely,  Smitty Cords, MD

## 2016-09-18 DIAGNOSIS — G4700 Insomnia, unspecified: Secondary | ICD-10-CM | POA: Insufficient documentation

## 2016-09-18 NOTE — Assessment & Plan Note (Signed)
Controlled symptoms - Refilled Omeprazole 20 mg daily

## 2016-09-18 NOTE — Assessment & Plan Note (Signed)
New problem for patient over the last month.  - Discussed proper sleep hygiene - Will try 3 mg of Melatonin qhs before bed - Follow up in 2-3 weeks - This could be a manifestation of uncontrolled depression/anxiety however did not discuss mood at today's visit. Can address at next visit

## 2016-09-18 NOTE — Assessment & Plan Note (Signed)
Controlled. Last BMP was 09/2015 and normal.  - Refilled Lisinopril-HCTZ 20-25mg  1 tablet daily - Will check CMP in 1-2 at follow up appt

## 2016-10-26 ENCOUNTER — Telehealth: Payer: Self-pay | Admitting: Family Medicine

## 2016-10-26 NOTE — Telephone Encounter (Signed)
No answer, LMOVM. Called to schedule follow-up appointment regarding insomnia and blood work. Patient was advised to call the front desk to schedule apt.

## 2016-11-16 NOTE — Telephone Encounter (Signed)
Pre-visit call. Patient confirmed apt for 11/17/16. Patient was advised to arrive early for check-in and to bring all current medications. She was informed by dermatology that she has low iron; currently using over the counter supplements.

## 2016-11-17 ENCOUNTER — Encounter: Payer: Self-pay | Admitting: Family Medicine

## 2016-11-17 ENCOUNTER — Ambulatory Visit (INDEPENDENT_AMBULATORY_CARE_PROVIDER_SITE_OTHER): Payer: Medicare Other | Admitting: Family Medicine

## 2016-11-17 VITALS — BP 118/70 | HR 91 | Temp 98.8°F | Wt 174.0 lb

## 2016-11-17 DIAGNOSIS — I10 Essential (primary) hypertension: Secondary | ICD-10-CM | POA: Diagnosis not present

## 2016-11-17 DIAGNOSIS — G4700 Insomnia, unspecified: Secondary | ICD-10-CM | POA: Diagnosis not present

## 2016-11-17 DIAGNOSIS — R350 Frequency of micturition: Secondary | ICD-10-CM | POA: Diagnosis not present

## 2016-11-17 LAB — POCT UA - MICROSCOPIC ONLY

## 2016-11-17 MED ORDER — MELATONIN 3 MG PO TABS: 1.0000 | ORAL_TABLET | Freq: Every day | ORAL | 5 refills | Status: DC
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
## 2016-11-17 NOTE — Patient Instructions (Addendum)
Thank you for coming in today, it was so nice to see you! Today we talked about:    Frequent urination: We have checked your urine today, there are no signs of infection.   Sleep problems: I have refilled your melatonin  We will check your blood work today   Please follow up in 6 months.   Bring in all your medications or supplements to each appointment for review.   If we ordered any tests today, you will be notified via telephone of any abnormalities. If everything is normal you will get a letter in the mail.   If you have any questions or concerns, please do not hesitate to call the office at [phone]. You can also message me directly via MyChart.   Sincerely,  Smitty Cords, MD

## 2016-11-17 NOTE — Assessment & Plan Note (Signed)
UA showing no signs of UTI. There are plenty of RBCs in urine, patient is on her period. Discussed increasing water consumption and decreasing soda. Discussed urinating at scheduled times and before bedtime. Will check BMP today to assess kidney function and see if there is any elevated glucose.

## 2016-11-17 NOTE — Assessment & Plan Note (Signed)
Intermittent problem for patient since she ran out of her melatonin one month ago. Was doing fine when she was taking the melatonin. -Refilled 3 mg melatonin daily at bedtime

## 2016-11-18 ENCOUNTER — Other Ambulatory Visit: Payer: Self-pay | Admitting: Family Medicine

## 2016-11-18 ENCOUNTER — Encounter: Payer: Self-pay | Admitting: Family Medicine

## 2016-11-18 DIAGNOSIS — D649 Anemia, unspecified: Secondary | ICD-10-CM

## 2016-11-18 LAB — BASIC METABOLIC PANEL
BUN/Creatinine Ratio: 18 (ref 9–23)
BUN: 14 mg/dL (ref 6–24)
CALCIUM: 9.3 mg/dL (ref 8.7–10.2)
CO2: 21 mmol/L (ref 20–29)
CREATININE: 0.78 mg/dL (ref 0.57–1.00)
Chloride: 104 mmol/L (ref 96–106)
GFR, EST AFRICAN AMERICAN: 106 mL/min/{1.73_m2} (ref >59)
GFR, EST NON AFRICAN AMERICAN: 92 mL/min/{1.73_m2} (ref >59)
Glucose: 100 mg/dL — ABNORMAL HIGH (ref 65–99)
Potassium: 3.6 mmol/L (ref 3.5–5.2)
Sodium: 141 mmol/L (ref 134–144)

## 2016-11-18 LAB — CBC WITH DIFFERENTIAL/PLATELET
BASOS: 1 %
Basophils Absolute: 0 10*3/uL (ref 0.0–0.2)
EOS (ABSOLUTE): 0.1 10*3/uL (ref 0.0–0.4)
Eos: 2 %
HEMOGLOBIN: 9.1 g/dL — AB (ref 11.1–15.9)
Hematocrit: 30 % — ABNORMAL LOW (ref 34.0–46.6)
IMMATURE GRANS (ABS): 0 10*3/uL (ref 0.0–0.1)
Immature Granulocytes: 0 %
LYMPHS: 30 %
Lymphocytes Absolute: 1.4 10*3/uL (ref 0.7–3.1)
MCH: 23.7 pg — AB (ref 26.6–33.0)
MCHC: 30.3 g/dL — ABNORMAL LOW (ref 31.5–35.7)
MCV: 78 fL — AB (ref 79–97)
MONOCYTES: 10 %
Monocytes Absolute: 0.4 10*3/uL (ref 0.1–0.9)
NEUTROS ABS: 2.6 10*3/uL (ref 1.4–7.0)
Neutrophils: 57 %
Platelets: 471 10*3/uL — ABNORMAL HIGH (ref 150–379)
RBC: 3.84 x10E6/uL (ref 3.77–5.28)
RDW: 16.5 % — ABNORMAL HIGH (ref 12.3–15.4)
WBC: 4.6 10*3/uL (ref 3.4–10.8)

## 2016-11-18 LAB — POCT URINALYSIS DIP (MANUAL ENTRY)
BILIRUBIN UA: NEGATIVE
BILIRUBIN UA: NEGATIVE mg/dL
Glucose, UA: NEGATIVE mg/dL
LEUKOCYTES UA: NEGATIVE
NITRITE UA: NEGATIVE
Spec Grav, UA: >=1.03 — AB (ref 1.010–1.025)
Urobilinogen, UA: 0.2 E.U./dL
pH, UA: 6 (ref 5.0–8.0)

## 2016-11-18 NOTE — Progress Notes (Signed)
Called patient to discuss blood work and new anemia. No answer. Left voicemail and wrote her a letter. Will place a future order for anemia panel.   Smitty Cords, MD [place], PGY-2

## 2016-11-18 NOTE — Addendum Note (Signed)
Addended by: Carlyle Dolly on: 11/18/2016 10:42 AM   Modules accepted: Orders

## 2017-01-01 ENCOUNTER — Other Ambulatory Visit: Payer: Self-pay | Admitting: Family Medicine

## 2017-01-01 DIAGNOSIS — F329 Major depressive disorder, single episode, unspecified: Secondary | ICD-10-CM

## 2017-01-01 DIAGNOSIS — F32A Depression, unspecified: Secondary | ICD-10-CM

## 2017-01-02 ENCOUNTER — Other Ambulatory Visit: Payer: Self-pay | Admitting: Family Medicine

## 2017-01-02 DIAGNOSIS — I10 Essential (primary) hypertension: Secondary | ICD-10-CM
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
## 2017-01-09 ENCOUNTER — Encounter: Payer: Self-pay | Admitting: Family Medicine

## 2017-01-09 ENCOUNTER — Ambulatory Visit (INDEPENDENT_AMBULATORY_CARE_PROVIDER_SITE_OTHER): Payer: Medicare Other | Admitting: Family Medicine

## 2017-01-09 VITALS — BP 118/80 | HR 95 | Temp 98.2°F | Ht 64.0 in | Wt 173.6 lb

## 2017-01-09 DIAGNOSIS — K219 Gastro-esophageal reflux disease without esophagitis: Secondary | ICD-10-CM | POA: Diagnosis not present

## 2017-01-09 DIAGNOSIS — I10 Essential (primary) hypertension: Secondary | ICD-10-CM | POA: Diagnosis not present

## 2017-01-09 DIAGNOSIS — A599 Trichomoniasis, unspecified: Secondary | ICD-10-CM

## 2017-01-09 DIAGNOSIS — R109 Unspecified abdominal pain: Secondary | ICD-10-CM | POA: Diagnosis not present

## 2017-01-09 DIAGNOSIS — D649 Anemia, unspecified: Secondary | ICD-10-CM | POA: Insufficient documentation

## 2017-01-09 LAB — POCT URINALYSIS DIP (MANUAL ENTRY)
Bilirubin, UA: NEGATIVE
GLUCOSE UA: NEGATIVE mg/dL
Ketones, POC UA: NEGATIVE mg/dL
Nitrite, UA: NEGATIVE
Protein Ur, POC: NEGATIVE mg/dL
SPEC GRAV UA: 1.025 (ref 1.010–1.025)
UROBILINOGEN UA: 0.2 U/dL
pH, UA: 6.5 (ref 5.0–8.0)

## 2017-01-09 LAB — POCT UA - MICROSCOPIC ONLY

## 2017-01-09 MED ORDER — OMEPRAZOLE 20 MG PO CPDR: DELAYED_RELEASE_CAPSULE | ORAL | 3 refills | Status: DC

## 2017-01-09 MED ORDER — HYDROXYCHLOROQUINE SULFATE 200 MG PO TABS: 200.0000 mg | ORAL_TABLET | Freq: Two times a day (BID) | ORAL | 0 refills | Status: DC

## 2017-01-09 MED ORDER — FERROUS SULFATE 325 (65 FE) MG PO TABS: 325.0000 mg | ORAL_TABLET | Freq: Every day | ORAL | 2 refills | Status: DC

## 2017-01-09 MED ORDER — LISINOPRIL-HYDROCHLOROTHIAZIDE 20-25 MG PO TABS: 1.0000 | ORAL_TABLET | Freq: Every day | ORAL | 0 refills | Status: DC

## 2017-01-09 MED ORDER — METRONIDAZOLE 500 MG PO TABS: 500.0000 mg | ORAL_TABLET | Freq: Two times a day (BID) | ORAL | 0 refills | Status: DC

## 2017-01-09 NOTE — Assessment & Plan Note (Signed)
Performed UA today as patient was having right flank pain but no other UTI symptoms. Incidentally there was Trichomonas found in her urine. Called patient to discuss this result, she knows that she hasn't been sexually active since June. Asked patient to come in to receive wet prep and other STD tests. She notes that she will schedule an appointment. -Prescription sent for Flagyl

## 2017-01-09 NOTE — Assessment & Plan Note (Signed)
Microcytic anemia. Hemoglobin 9.1 with MCV of 78 at last check. Noted to have iron deficiency at her outpatient dermatology office. Could be blood loss anemia from heavy periods, however patient unable to quantify how many pads or tampons she uses. No GI or urine bleeding per patient report. -check anemia panel today -Begin ferrous sulfate daily -Follow-up in one month

## 2017-01-09 NOTE — Assessment & Plan Note (Addendum)
Right-sided flank pain 1 week. Likely muscle strain. No clear CVA tenderness or UTI symptoms. UA negative for infection but did show 3+ bacteria and was noted to have Trichomonas under microscope (see separate Trichomonas assessment and plan) -Ibuprofen when necessary for pain -Follow-up in one week if not improving as anticipated

## 2017-01-09 NOTE — Patient Instructions (Signed)
Thank you for coming in today, it was so nice to see you! Today we talked about:    Side pain: I think this is a muscle spasm. We will check your urine just to make sure you don't have an infection. Take ibuprofen as needed for this pain  We have checked your blood. Start taking daily iron pills, I have sent this prescription to your pharmacy.   Please follow up in 1 month. You can schedule this appointment at the front desk before you leave or call the clinic.  Bring in all your medications or supplements to each appointment for review.   If we ordered any tests today, you will be notified via telephone of any abnormalities. If everything is normal you will get a letter in the mail.   If you have any questions or concerns, please do not hesitate to call the office at [phone]. You can also message me directly via MyChart.   Sincerely,  Smitty Cords, MD

## 2017-01-10 LAB — COMPREHENSIVE METABOLIC PANEL
ALT: 9 IU/L (ref 0–32)
AST: 19 IU/L (ref 0–40)
Albumin/Globulin Ratio: 1.3 (ref 1.2–2.2)
Albumin: 4 g/dL (ref 3.5–5.5)
Alkaline Phosphatase: 38 IU/L — ABNORMAL LOW (ref 39–117)
BUN/Creatinine Ratio: 17 (ref 9–23)
BUN: 13 mg/dL (ref 6–24)
Bilirubin Total: 0.3 mg/dL (ref 0.0–1.2)
CALCIUM: 9.4 mg/dL (ref 8.7–10.2)
CHLORIDE: 99 mmol/L (ref 96–106)
CO2: 22 mmol/L (ref 20–29)
CREATININE: 0.77 mg/dL (ref 0.57–1.00)
GFR, EST AFRICAN AMERICAN: 108 mL/min/{1.73_m2} (ref >59)
GFR, EST NON AFRICAN AMERICAN: 94 mL/min/{1.73_m2} (ref >59)
GLUCOSE: 90 mg/dL (ref 65–99)
Globulin, Total: 3.1 g/dL (ref 1.5–4.5)
Potassium: 3.5 mmol/L (ref 3.5–5.2)
Sodium: 139 mmol/L (ref 134–144)
TOTAL PROTEIN: 7.1 g/dL (ref 6.0–8.5)

## 2017-01-10 LAB — ANEMIA PANEL
FERRITIN: 12 ng/mL — AB (ref 15–150)
Folate, Hemolysate: 338.5 ng/mL
Folate, RBC: 927 ng/mL (ref >498)
Hematocrit: 36.5 % (ref 34.0–46.6)
IRON: 33 ug/dL (ref 27–159)
Iron Saturation: 6 % — CL (ref 15–55)
RETIC CT PCT: 1 % (ref 0.6–2.6)
Total Iron Binding Capacity: 568 ug/dL (ref 250–450)
UIBC: 535 ug/dL — AB (ref 131–425)
Vitamin B-12: 338 pg/mL (ref 232–1245)

## 2017-01-12 ENCOUNTER — Telehealth: Payer: Self-pay | Admitting: Family Medicine

## 2017-01-12 ENCOUNTER — Encounter: Payer: Self-pay | Admitting: Family Medicine

## 2017-01-12 MED ORDER — FLUCONAZOLE 150 MG PO TABS
150.0000 mg | ORAL_TABLET | Freq: Once | ORAL | 0 refills | Status: AC
Start: 2017-01-12 — End: 2017-01-12

## 2017-01-12 NOTE — Telephone Encounter (Signed)
Called patient to discuss low iron. No answer. Left voicemail. Will also send a letter in the mail.   Smitty Cords, MD [place], PGY-3

## 2017-01-12 NOTE — Telephone Encounter (Signed)
Patient returned call. Discussed lab results. She is endorsing that she has a heavy menstrual flow. Asked her to call the clinic and schedule an appointment so we could discuss this. Additionally, she states that since receiving antibiotics she developed a yeast infection. Will send over prescription for diflucan.   Smitty Cords, MD [place], PGY-3

## 2017-02-23 ENCOUNTER — Other Ambulatory Visit: Payer: Self-pay | Admitting: Family Medicine

## 2017-02-23 DIAGNOSIS — I10 Essential (primary) hypertension: Secondary | ICD-10-CM
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
## 2017-02-28 ENCOUNTER — Encounter: Payer: Self-pay | Admitting: Family Medicine

## 2017-02-28 ENCOUNTER — Ambulatory Visit (INDEPENDENT_AMBULATORY_CARE_PROVIDER_SITE_OTHER): Payer: Medicare Other | Admitting: Family Medicine

## 2017-02-28 VITALS — BP 118/64 | HR 77 | Temp 97.9°F | Ht 64.0 in | Wt 171.8 lb

## 2017-02-28 DIAGNOSIS — N920 Excessive and frequent menstruation with regular cycle: Secondary | ICD-10-CM | POA: Diagnosis not present

## 2017-02-28 DIAGNOSIS — K219 Gastro-esophageal reflux disease without esophagitis: Secondary | ICD-10-CM | POA: Diagnosis not present

## 2017-02-28 DIAGNOSIS — Z0000 Encounter for general adult medical examination without abnormal findings: Secondary | ICD-10-CM

## 2017-02-28 DIAGNOSIS — D509 Iron deficiency anemia, unspecified: Secondary | ICD-10-CM | POA: Diagnosis not present

## 2017-02-28 DIAGNOSIS — F329 Major depressive disorder, single episode, unspecified: Secondary | ICD-10-CM | POA: Diagnosis not present

## 2017-02-28 DIAGNOSIS — Z23 Encounter for immunization: Secondary | ICD-10-CM | POA: Diagnosis not present

## 2017-02-28 DIAGNOSIS — F32A Depression, unspecified: Secondary | ICD-10-CM

## 2017-02-28 DIAGNOSIS — I10 Essential (primary) hypertension: Secondary | ICD-10-CM | POA: Diagnosis not present

## 2017-02-28 MED ORDER — OMEPRAZOLE 20 MG PO CPDR: DELAYED_RELEASE_CAPSULE | ORAL | 3 refills | Status: DC

## 2017-02-28 MED ORDER — LISINOPRIL-HYDROCHLOROTHIAZIDE 20-25 MG PO TABS: 1.0000 | ORAL_TABLET | Freq: Every day | ORAL | 6 refills | Status: DC

## 2017-02-28 MED ORDER — BUPROPION HCL ER (SR) 150 MG PO TB12: 150.0000 mg | ORAL_TABLET | Freq: Two times a day (BID) | ORAL | 3 refills | Status: DC

## 2017-02-28 NOTE — Patient Instructions (Signed)
Thank you for coming in today, it was so nice to see you! Today we talked about:    Anemia: we will check your blood today  Heavy menstrual bleeding: We have ordered an ultrasound for you. I will follow up with your after this is done  Please follow up in 1 week. You can schedule this appointment at the front desk before you leave or call the clinic.  Bring in all your medications or supplements to each appointment for review.   If we ordered any tests today, you will be notified via telephone of any abnormalities. If everything is normal you will get a letter in the mail.   If you have any questions or concerns, please do not hesitate to call the office at [phone]. You can also message me directly via MyChart.   Sincerely,  Smitty Cords, MD

## 2017-03-01 LAB — CBC WITH DIFFERENTIAL/PLATELET
BASOS: 0 %
Basophils Absolute: 0 10*3/uL (ref 0.0–0.2)
EOS (ABSOLUTE): 0.1 10*3/uL (ref 0.0–0.4)
EOS: 1 %
Hematocrit: 41.8 % (ref 34.0–46.6)
Hemoglobin: 13.3 g/dL (ref 11.1–15.9)
Immature Grans (Abs): 0 10*3/uL (ref 0.0–0.1)
Immature Granulocytes: 0 %
LYMPHS: 34 %
Lymphocytes Absolute: 1.6 10*3/uL (ref 0.7–3.1)
MCH: 30.2 pg (ref 26.6–33.0)
MCHC: 31.8 g/dL (ref 31.5–35.7)
MCV: 95 fL (ref 79–97)
MONOCYTES: 7 %
Monocytes Absolute: 0.3 10*3/uL (ref 0.1–0.9)
NEUTROS ABS: 2.6 10*3/uL (ref 1.4–7.0)
Neutrophils: 58 %
PLATELETS: 348 10*3/uL (ref 150–379)
RBC: 4.41 x10E6/uL (ref 3.77–5.28)
RDW: 15.5 % — AB (ref 12.3–15.4)
WBC: 4.6 10*3/uL (ref 3.4–10.8)

## 2017-03-01 LAB — IRON AND TIBC
Iron Saturation: 38 % (ref 15–55)
Iron: 157 ug/dL (ref 27–159)
Total Iron Binding Capacity: 409 ug/dL (ref 250–450)
UIBC: 252 ug/dL (ref 131–425)

## 2017-03-02 DIAGNOSIS — N920 Excessive and frequent menstruation with regular cycle: Secondary | ICD-10-CM | POA: Insufficient documentation

## 2017-03-02 MED ORDER — FERROUS SULFATE 325 (65 FE) MG PO TABS: 325.0000 mg | ORAL_TABLET | Freq: Every day | ORAL | 2 refills | Status: DC

## 2017-03-02 NOTE — Assessment & Plan Note (Signed)
Resolved per CBC. Patient has been faithfully taking her iron and her iron is back to normal now. She states she hates taking her iron pills, advised she can take every other day and we will recheck in 3 months.

## 2017-03-02 NOTE — Assessment & Plan Note (Signed)
Patient doing well overall. Up to date on health maintenance, got flu shot today. Thinking about getting a mammogram either this year or next.  - Follow up in 1 year for next annual physical

## 2017-03-02 NOTE — Assessment & Plan Note (Signed)
Controlled. BMP normal today.  - Continue Lisinopril-HCTZ at current dose - Follow up in 6 months

## 2017-03-06 NOTE — Assessment & Plan Note (Addendum)
Patient endorsing heavy menses. She is on OCPs for this. Has known iron deficiency anemia but resolved per labs today - Pelvic and transvaginal U/S

## 2017-03-09 ENCOUNTER — Ambulatory Visit (HOSPITAL_COMMUNITY): Payer: Medicare Other

## 2017-03-16 ENCOUNTER — Ambulatory Visit (HOSPITAL_COMMUNITY)
Admission: RE | Admit: 2017-03-16 | Discharge: 2017-03-16 | Disposition: A | Discharge status: Home / Self Care | Payer: Medicare Other | Source: Ambulatory Visit | Attending: Family Medicine | Admitting: Family Medicine

## 2017-03-16 DIAGNOSIS — N920 Excessive and frequent menstruation with regular cycle: Secondary | ICD-10-CM | POA: Insufficient documentation

## 2017-03-16 DIAGNOSIS — D259 Leiomyoma of uterus, unspecified: Secondary | ICD-10-CM | POA: Diagnosis not present

## 2017-03-17 ENCOUNTER — Telehealth: Payer: Self-pay | Admitting: *Deleted

## 2017-03-17 DIAGNOSIS — N920 Excessive and frequent menstruation with regular cycle: Secondary | ICD-10-CM

## 2017-03-17 DIAGNOSIS — D219 Benign neoplasm of connective and other soft tissue, unspecified: Secondary | ICD-10-CM

## 2017-03-17 NOTE — Telephone Encounter (Signed)
Pt LM on nurse line.  Would like to know the results from yesterday.  Fleeger, Salome Spotted, CMA

## 2017-03-20 NOTE — Telephone Encounter (Signed)
Discussed ultrasound results with patient, there are signs of fibroids based on the ultrasound. Patient states she "wants them out". She is thinking about a hysterectomy. Will refer to gynecology at this time.   Smitty Cords, MD [place], PGY-3

## 2017-04-05 ENCOUNTER — Ambulatory Visit (INDEPENDENT_AMBULATORY_CARE_PROVIDER_SITE_OTHER): Payer: Medicare Other | Admitting: Obstetrics & Gynecology

## 2017-04-05 ENCOUNTER — Encounter: Payer: Self-pay | Admitting: Obstetrics & Gynecology

## 2017-04-05 VITALS — BP 136/88 | Ht 63.0 in | Wt 173.0 lb

## 2017-04-05 DIAGNOSIS — D251 Intramural leiomyoma of uterus: Secondary | ICD-10-CM

## 2017-04-05 DIAGNOSIS — N920 Excessive and frequent menstruation with regular cycle: Secondary | ICD-10-CM | POA: Diagnosis not present

## 2017-04-05 NOTE — Patient Instructions (Addendum)
1. Menorrhagia with regular cycle Refractory Menorrhagia with continued heavy menses on BCPs x 6 months.  Not tolerating and not responding to  Hormonal contraception.  Decision to proceed with Robotic TLH/Bilateral Salpingectomy.  Preservation of ovaries.  Surgery and risks discussed with patient.  Will schedule.  F/U Preop visit.  2. Fibroids, intramural As above  Pelvic US 03/16/2017:  Uterus: Measurements: 11.0 x 6.4 x 8.5 cm. Lobular contour is associated with multiple fibroids evident. One of the dominant fibroids is right fundal intramural location measuring 6.5 cm. Endometrium: Thickness: 4 mm. Portions of the endometrial canal towards the fundus are obscured by the fibroid disease. No focal abnormality visualized. Right ovary: Measurements: 3.8 x 1.6 x 2.8 cm. Normal appearance/no adnexal mass. Left ovary: Measurements: 3.9 x 1.7 x 2.7 cm. Normal appearance/no adnexal mass.  Trace free fluid present.  Patrizia, it was a pleasure meeting you today!  Total Laparoscopic Hysterectomy A total laparoscopic hysterectomy is a minimally invasive surgery to remove your uterus and cervix. This surgery is performed by making several small cuts (incisions) in your abdomen. It can also be done with a thin, lighted tube (laparoscope) inserted into two small incisions in your lower abdomen. Your fallopian tubes and ovaries can be removed (bilateral salpingo-oophorectomy) during this surgery as well.Benefits of minimally invasive surgery include:  Less pain.  Less risk of blood loss.  Less risk of infection.  Quicker return to normal activities.  Tell a health care provider about:  Any allergies you have.  All medicines you are taking, including vitamins, herbs, eye drops, creams, and over-the-counter medicines.  Any problems you or family members have had with anesthetic medicines.  Any blood disorders you have.  Any surgeries you have had.  Any medical conditions you have. What are the  risks? Generally, this is a safe procedure. However, as with any procedure, complications can occur. Possible complications include:  Bleeding.  Blood clots in the legs or lung.  Infection.  Injury to surrounding organs.  Problems with anesthesia.  Early menopause symptoms (hot flashes, night sweats, insomnia).  Risk of conversion to an open abdominal incision.  What happens before the procedure?  Ask your health care provider about changing or stopping your regular medicines.  Do not take aspirin or blood thinners (anticoagulants) for 1 week before the surgery or as told by your health care provider.  Do not eat or drink anything for 8 hours before the surgery or as told by your health care provider.  Quit smoking if you smoke.  Arrange for a ride home after surgery and for someone to help you at home during recovery. What happens during the procedure?  You will be given antibiotic medicine.  An IV tube will be placed in your arm. You will be given medicine to make you sleep (general anesthetic).  A gas (carbon dioxide) will be used to inflate your abdomen. This will allow your surgeon to look inside your abdomen, perform your surgery, and treat any other problems found if necessary.  Three or four small incisions (often less than 1/2 inch) will be made in your abdomen. One of these incisions will be made in the area of your belly button (navel). The laparoscope will be inserted into the incision. Your surgeon will look through the laparoscope while doing your procedure.  Other surgical instruments will be inserted through the other incisions.  Your uterus may be removed through your vagina or cut into small pieces and removed through the small incisions.  Your incisions will be closed. What happens after the procedure?  The gas will be released from inside your abdomen.  You will be taken to the recovery area where a nurse will watch and check your progress. Once you  are awake, stable, and taking fluids well, without other problems, you will return to your room or be allowed to go home.  There is usually minimal discomfort following the surgery because the incisions are so small.  You will be given pain medicine while you are in the hospital and for when you go home. This information is not intended to replace advice given to you by your health care provider. Make sure you discuss any questions you have with your health care provider. Document Released: 03/20/2007 Document Revised: 10/29/2015 Document Reviewed: 12/11/2012 Elsevier Interactive Patient Education  2017 [employer].

## 2017-04-05 NOTE — Progress Notes (Signed)
    DORREEN VALIENTE 04/11/1971 [id number]        46 y.o.  G1P1 Single.  Boyfriend.  Son 14 yo.  RP:  Heavy periods worsening x 10 months  HPI:   Heavy periods worsening x last 10 months.  Patient has not tolerated DepoProvera in the past.  Had BTB on Nexplanon.  Continued with heavy prolonged bleeding on Progestin IUD.  More recently, on BCPs last 6 months, has continued to have very heavy bleeding and her period were even longer.  Just stopped the BCPs.  Not sexually active x 01/2017.  Used condoms in 01/2017.  Per chart results, Hb at the lowest in 11/2016 at 9.1.  On FeS4 and BCPs, has improved to 13.3 in 02/2017.  Past medical history,surgical history, problem list, medications, allergies, family history and social history were all reviewed and documented in the EPIC chart.  Directed ROS with pertinent positives and negatives documented in the history of present illness/assessment and plan.  Exam:  Vitals:   04/05/17 1452  BP: 136/88  Weight: 173 lb (78.5 kg)  Height: 5\' 3"  (1.6 m)   General appearance:  Normal  Abdomen:  Soft, NT, no mass felt  Gyn exam:  Vulva normal.  Bimanual exam:  Uterus irregular with anterior myomas.  Overall uterine size about 12 cm.  No Adnexal mass felt.  NT.   Pelvic US 03/16/2017:  Uterus: Measurements: 11.0 x 6.4 x 8.5 cm. Lobular contour is associated with multiple fibroids evident. One of the dominant fibroids is right fundal intramural location measuring 6.5 cm. Endometrium: Thickness: 4 mm. Portions of the endometrial canal towards the fundus are obscured by the fibroid disease. No focal abnormality visualized. Right ovary: Measurements: 3.8 x 1.6 x 2.8 cm. Normal appearance/no adnexal mass. Left ovary: Measurements: 3.9 x 1.7 x 2.7 cm. Normal appearance/no adnexal mass.  Trace free fluid evident.  Assessment/Plan:  46 y.o. G1P1   1. Menorrhagia with regular cycle Refractory Menorrhagia with continued heavy menses on BCPs x 6 months.  Not  tolerating and not responding to  Hormonal contraception.  Decision to proceed with Robotic TLH/Bilateral Salpingectomy.  Preservation of ovaries.  Surgery and risks discussed with patient.  Will schedule.  F/U Preop visit.  2. Fibroids, intramural As above.  Counseling on above issues >50% x 45 minutes  [name], 3:12 PM 04/05/2017

## 2017-04-10 ENCOUNTER — Telehealth: Payer: Self-pay

## 2017-04-10 NOTE — Telephone Encounter (Signed)
Patient called asking if she could bring forms from employer by the office this week and leave for me to complete. I told her that was fine and advised her regarding fee and also, that while she was here to sign a medical records release form if she thinks at any point in the surgery/disability process she will want me to speak with employer or send information to employer.

## 2017-04-12 DIAGNOSIS — Z0289 Encounter for other administrative examinations: Secondary | ICD-10-CM

## 2017-04-17 ENCOUNTER — Encounter: Payer: Self-pay | Admitting: Anesthesiology

## 2017-04-20 ENCOUNTER — Other Ambulatory Visit: Payer: Self-pay | Admitting: Family Medicine

## 2017-04-24 NOTE — Patient Instructions (Addendum)
Your procedure is scheduled on: Wednesday May 10, 2017 at 7:30 am  Enter through the [employer] of [facility] at: 6:00 am  Pick up the phone at the desk and dial [phone].  Call this number if you have problems the morning of surgery: [phone].  Remember: Do NOT eat food or drink any liquids after: Midnight on Tuesday December 4   Take these medicines the morning of surgery with a SIP OF WATER:  Wellbutrin, plaquenil and prilosec.  Do NOT wear jewelry (body piercing), metal hair clips/bobby pins, make-up, artificial eye lashes or nail polish. Do NOT wear lotions, powders, or perfumes.  You may wear deoderant. Do NOT shave for 48 hours prior to surgery. Do NOT bring valuables to the hospital. Contacts, dentures, or bridgework may not be worn into surgery. Leave suitcase in car.  After surgery it may be brought to your room.  For patients admitted to the hospital, checkout time is 11:00 AM the day of discharge.

## 2017-04-26 ENCOUNTER — Other Ambulatory Visit: Payer: Self-pay

## 2017-04-26 ENCOUNTER — Encounter (HOSPITAL_COMMUNITY)
Admission: RE | Admit: 2017-04-26 | Discharge: 2017-04-26 | Disposition: A | Discharge status: Home / Self Care | Payer: Medicare Other | Source: Ambulatory Visit | Attending: Obstetrics & Gynecology | Admitting: Obstetrics & Gynecology

## 2017-04-26 ENCOUNTER — Encounter (HOSPITAL_COMMUNITY): Payer: Self-pay

## 2017-04-26 DIAGNOSIS — Z01812 Encounter for preprocedural laboratory examination: Secondary | ICD-10-CM | POA: Diagnosis not present

## 2017-04-26 HISTORY — DX: Anemia, unspecified: D64.9

## 2017-04-26 HISTORY — DX: Headache, unspecified: R51.9

## 2017-04-26 HISTORY — DX: Headache: R51

## 2017-04-26 LAB — CBC
HCT: 39.5 % (ref 36.0–46.0)
HEMOGLOBIN: 13.5 g/dL (ref 12.0–15.0)
MCH: 32.6 pg (ref 26.0–34.0)
MCHC: 34.2 g/dL (ref 30.0–36.0)
MCV: 95.4 fL (ref 78.0–100.0)
Platelets: 294 10*3/uL (ref 150–400)
RBC: 4.14 MIL/uL (ref 3.87–5.11)
RDW: 13.1 % (ref 11.5–15.5)
WBC: 3.5 10*3/uL — ABNORMAL LOW (ref 4.0–10.5)

## 2017-04-26 LAB — BASIC METABOLIC PANEL
Anion gap: 9 (ref 5–15)
BUN: 15 mg/dL (ref 6–20)
CALCIUM: 9.3 mg/dL (ref 8.9–10.3)
CHLORIDE: 107 mmol/L (ref 101–111)
CO2: 23 mmol/L (ref 22–32)
CREATININE: 0.74 mg/dL (ref 0.44–1.00)
GFR calc non Af Amer: >60 mL/min (ref >60)
Glucose, Bld: 119 mg/dL — ABNORMAL HIGH (ref 65–99)
Potassium: 3.4 mmol/L — ABNORMAL LOW (ref 3.5–5.1)
SODIUM: 139 mmol/L (ref 135–145)

## 2017-04-26 LAB — TYPE AND SCREEN
ABO/RH(D): O POS
ANTIBODY SCREEN: NEGATIVE

## 2017-04-26 LAB — ABO/RH: ABO/RH(D): O POS

## 2017-05-02 ENCOUNTER — Encounter: Payer: Self-pay | Admitting: Anesthesiology

## 2017-05-03 ENCOUNTER — Encounter: Payer: Self-pay | Admitting: Obstetrics & Gynecology

## 2017-05-03 ENCOUNTER — Ambulatory Visit: Payer: Medicare Other | Admitting: Obstetrics & Gynecology

## 2017-05-03 VITALS — BP 134/80 | Ht 63.0 in | Wt 174.0 lb

## 2017-05-03 DIAGNOSIS — N920 Excessive and frequent menstruation with regular cycle: Secondary | ICD-10-CM | POA: Diagnosis not present

## 2017-05-03 DIAGNOSIS — D219 Benign neoplasm of connective and other soft tissue, unspecified: Secondary | ICD-10-CM

## 2017-05-03 NOTE — Progress Notes (Signed)
    Allison Steele March 21, 1971 [id number]        46 y.o.  G1P1 Patient is legally blind, sees with peripheral vision.  RP:  Preop Robotic TLH/Bilateral Salpingectomy on 05/11/2017  HPI:  No change x visit 04/05/2017:   Heavy periods worsening x last 10 months.  Patient has not tolerated DepoProvera in the past.  Had BTB on Nexplanon.  Continued with heavy prolonged bleeding on Progestin IUD.  More recently, on BCPs last 6 months, has continued to have very heavy bleeding and her period were even longer.  Just stopped the BCPs.  Not sexually active x 01/2017.  Used condoms in 01/2017.  Per chart results, Hb at the lowest in 11/2016 at 9.1.  On FeS4 and BCPs, has improved to 13.3 in 02/2017.  Past medical history,surgical history, problem list, medications, allergies, family history and social history were all reviewed and documented in the EPIC chart.  Directed ROS with pertinent positives and negatives documented in the history of present illness/assessment and plan.  Exam:  Vitals:   05/03/17 1409  BP: 134/80  Weight: 174 lb (78.9 kg)  Height: 5\' 3"  (1.6 m)   General appearance:  Normal  Heart/Lungs normal  Pelvic US 03/16/2017:  Uterus: Measurements: 11.0 x 6.4 x 8.5 cm. Lobular contour is associated with multiple fibroids evident. One of the dominant fibroids is right fundal intramural location measuring 6.5 cm. Endometrium: Thickness: 4 mm. Portions of the endometrial canal towards the fundus are obscured by the fibroid disease. No focal abnormality visualized. Right ovary: Measurements: 3.8 x 1.6 x 2.8 cm. Normal appearance/no adnexal mass. Left ovary: Measurements: 3.9 x 1.7 x 2.7 cm Trace free fluid evident.   Assessment/Plan:  46 y.o. G1P1   1. Menorrhagia with regular cycle Refractory Menorrhagia with continued heavy menses on BCPs x 6 months.  Not tolerating and not responding to  Hormonal contraception.  Decision to proceed with Robotic TLH/Bilateral Salpingectomy.  Preservation  of ovaries.  Surgery and risks thoroughly discussed with patient.   2. Fibroids As above.                        Patient was counseled as to the risk of surgery to include the following:  1. Infection (prohylactic antibiotics will be administered)  2. DVT/Pulmonary Embolism (prophylactic pneumo compression stockings will be used)  3.Trauma to internal organs requiring additional surgical procedure to repair any injury to     Internal organs requiring perhaps additional hospitalization days.  4.Hemmorhage requiring transfusion and blood products which carry risks such as anaphylactic reaction, hepatitis and AIDS  Patient had received literature information on the procedure scheduled and all her questions were answered and fully accepts all risk.  Counseling on above issues more than 50% for 15 minutes.  [name], 2:35 PM 05/03/2017

## 2017-05-04 ENCOUNTER — Telehealth: Payer: Self-pay | Admitting: *Deleted

## 2017-05-04 NOTE — Telephone Encounter (Signed)
Message left on clinic nurse voice mail - patient states she is due for follow-up appt with PCP in December, but she will be having surgery.  Will make follow-up appt in January.   Burna Forts, BSN, RN-BC

## 2017-05-06 ENCOUNTER — Encounter: Payer: Self-pay | Admitting: Obstetrics & Gynecology

## 2017-05-06 NOTE — Patient Instructions (Signed)
1. Menorrhagia with regular cycle Refractory Menorrhagia with continued heavy menses on BCPs x 6 months.  Not tolerating and not responding to  Hormonal contraception.  Decision to proceed with Robotic TLH/Bilateral Salpingectomy.  Preservation of ovaries.  Surgery and risks thoroughly discussed with patient.   2. Fibroids As above.                        Patient was counseled as to the risk of surgery to include the following:  1. Infection (prohylactic antibiotics will be administered)  2. DVT/Pulmonary Embolism (prophylactic pneumo compression stockings will be used)  3.Trauma to internal organs requiring additional surgical procedure to repair any injury to     Internal organs requiring perhaps additional hospitalization days.  4.Hemmorhage requiring transfusion and blood products which carry risks such as anaphylactic reaction, hepatitis and AIDS  Patient had received literature information on the procedure scheduled and all her questions were answered and fully accepts all risk.  Allison Steele, it was a pleasure seeing you today!  I will see you again on the day of the surgery.

## 2017-05-09 NOTE — Anesthesia Preprocedure Evaluation (Signed)
Anesthesia Evaluation  Patient identified by MRN, date of birth, ID band Patient awake    Reviewed: Allergy & Precautions, NPO status , Patient's Chart, lab work & pertinent test results  Airway Mallampati: II  TM Distance: >3 FB Neck ROM: Full    Dental no notable dental hx.    Pulmonary neg pulmonary ROS,    Pulmonary exam normal breath sounds clear to auscultation       Cardiovascular hypertension, Pt. on medications negative cardio ROS Normal cardiovascular exam Rhythm:Regular Rate:Normal     Neuro/Psych  Headaches, PSYCHIATRIC DISORDERS Depression negative neurological ROS  negative psych ROS   GI/Hepatic negative GI ROS, Neg liver ROS,   Endo/Other  negative endocrine ROS  Renal/GU negative Renal ROS  negative genitourinary   Musculoskeletal negative musculoskeletal ROS (+)   Abdominal   Peds negative pediatric ROS (+)  Hematology negative hematology ROS (+) anemia ,   Anesthesia Other Findings   Reproductive/Obstetrics negative OB ROS                             Anesthesia Physical Anesthesia Plan  ASA: III  Anesthesia Plan: General   Post-op Pain Management:    Induction: Intravenous  PONV Risk Score and Plan: 3 and Ondansetron, Dexamethasone, Treatment may vary due to age or medical condition and Scopolamine patch - Pre-op  Airway Management Planned: Oral ETT  Additional Equipment:   Intra-op Plan:   Post-operative Plan: Extubation in OR  Informed Consent: I have reviewed the patients History and Physical, chart, labs and discussed the procedure including the risks, benefits and alternatives for the proposed anesthesia with the patient or authorized representative who has indicated his/her understanding and acceptance.     Plan Discussed with: CRNA and Anesthesiologist  Anesthesia Plan Comments: (  )        Anesthesia Quick Evaluation

## 2017-05-10 ENCOUNTER — Observation Stay (HOSPITAL_COMMUNITY)
Admission: RE | Admit: 2017-05-10 | Discharge: 2017-05-11 | Disposition: A | Discharge status: Home / Self Care | Payer: Medicare Other | Source: Ambulatory Visit | Attending: Obstetrics & Gynecology | Admitting: Obstetrics & Gynecology

## 2017-05-10 ENCOUNTER — Ambulatory Visit (HOSPITAL_COMMUNITY): Payer: Medicare Other | Admitting: Anesthesiology

## 2017-05-10 ENCOUNTER — Other Ambulatory Visit: Payer: Self-pay

## 2017-05-10 ENCOUNTER — Encounter (HOSPITAL_COMMUNITY)
Admission: RE | Disposition: A | Discharge status: Home / Self Care | Payer: Self-pay | Source: Ambulatory Visit | Attending: Obstetrics & Gynecology

## 2017-05-10 ENCOUNTER — Encounter (HOSPITAL_COMMUNITY): Payer: Self-pay

## 2017-05-10 DIAGNOSIS — Z87442 Personal history of urinary calculi: Secondary | ICD-10-CM | POA: Insufficient documentation

## 2017-05-10 DIAGNOSIS — Z9889 Other specified postprocedural states: Secondary | ICD-10-CM

## 2017-05-10 DIAGNOSIS — K219 Gastro-esophageal reflux disease without esophagitis: Secondary | ICD-10-CM | POA: Insufficient documentation

## 2017-05-10 DIAGNOSIS — F329 Major depressive disorder, single episode, unspecified: Secondary | ICD-10-CM | POA: Diagnosis not present

## 2017-05-10 DIAGNOSIS — D259 Leiomyoma of uterus, unspecified: Secondary | ICD-10-CM | POA: Diagnosis not present

## 2017-05-10 DIAGNOSIS — N800 Endometriosis of uterus: Principal | ICD-10-CM | POA: Insufficient documentation

## 2017-05-10 DIAGNOSIS — K660 Peritoneal adhesions (postprocedural) (postinfection): Secondary | ICD-10-CM | POA: Diagnosis not present

## 2017-05-10 DIAGNOSIS — N888 Other specified noninflammatory disorders of cervix uteri: Secondary | ICD-10-CM | POA: Diagnosis not present

## 2017-05-10 DIAGNOSIS — H469 Unspecified optic neuritis: Secondary | ICD-10-CM | POA: Insufficient documentation

## 2017-05-10 DIAGNOSIS — I10 Essential (primary) hypertension: Secondary | ICD-10-CM | POA: Insufficient documentation

## 2017-05-10 DIAGNOSIS — Z79899 Other long term (current) drug therapy: Secondary | ICD-10-CM | POA: Insufficient documentation

## 2017-05-10 DIAGNOSIS — D869 Sarcoidosis, unspecified: Secondary | ICD-10-CM | POA: Insufficient documentation

## 2017-05-10 DIAGNOSIS — Z7982 Long term (current) use of aspirin: Secondary | ICD-10-CM | POA: Insufficient documentation

## 2017-05-10 DIAGNOSIS — N920 Excessive and frequent menstruation with regular cycle: Secondary | ICD-10-CM | POA: Insufficient documentation

## 2017-05-10 HISTORY — PX: ROBOTIC ASSISTED TOTAL HYSTERECTOMY WITH SALPINGECTOMY: SHX6679

## 2017-05-10 LAB — PREGNANCY, URINE: PREG TEST UR: NEGATIVE

## 2017-05-10 SURGERY — ROBOTIC ASSISTED TOTAL HYSTERECTOMY WITH SALPINGECTOMY
Anesthesia: General | Laterality: Bilateral

## 2017-05-10 MED ORDER — HYDROMORPHONE HCL 1 MG/ML IJ SOLN
0.5000 mg | INTRAMUSCULAR | Status: DC | PRN
  Administered 2017-05-10: 0.5 mg via INTRAVENOUS
  Filled 2017-05-10: qty 0.5

## 2017-05-10 MED ORDER — ROCURONIUM BROMIDE 100 MG/10ML IV SOLN
INTRAVENOUS | Status: AC
  Filled 2017-05-10: qty 1

## 2017-05-10 MED ORDER — FENTANYL CITRATE (PF) 100 MCG/2ML IJ SOLN
25.0000 ug | INTRAMUSCULAR | Status: DC | PRN
  Administered 2017-05-10 (×2): 50 ug via INTRAVENOUS
  Administered 2017-05-10: 25 ug via INTRAVENOUS

## 2017-05-10 MED ORDER — DEXAMETHASONE SODIUM PHOSPHATE 4 MG/ML IJ SOLN
INTRAMUSCULAR | Status: AC
  Filled 2017-05-10: qty 1

## 2017-05-10 MED ORDER — EPHEDRINE SULFATE 50 MG/ML IJ SOLN
INTRAMUSCULAR | Status: DC | PRN
  Administered 2017-05-10 (×4): 10 mg via INTRAVENOUS

## 2017-05-10 MED ORDER — LISINOPRIL 20 MG PO TABS
20.0000 mg | ORAL_TABLET | Freq: Every day | ORAL | Status: DC
  Administered 2017-05-11: 20 mg via ORAL
  Filled 2017-05-10 (×2): qty 1

## 2017-05-10 MED ORDER — KETOROLAC TROMETHAMINE 30 MG/ML IJ SOLN
INTRAMUSCULAR | Status: AC
  Filled 2017-05-10: qty 1

## 2017-05-10 MED ORDER — EPHEDRINE 5 MG/ML INJ
INTRAVENOUS | Status: AC
  Filled 2017-05-10: qty 10

## 2017-05-10 MED ORDER — LISINOPRIL-HYDROCHLOROTHIAZIDE 20-25 MG PO TABS: 1.0000 | ORAL_TABLET | Freq: Every day | ORAL | Status: DC

## 2017-05-10 MED ORDER — FENTANYL CITRATE (PF) 100 MCG/2ML IJ SOLN: 25.0000 ug | INTRAMUSCULAR | Status: DC | PRN

## 2017-05-10 MED ORDER — SUGAMMADEX SODIUM 200 MG/2ML IV SOLN
INTRAVENOUS | Status: AC
  Filled 2017-05-10: qty 2

## 2017-05-10 MED ORDER — INFLUENZA VAC SPLIT QUAD 0.5 ML IM SUSY: 0.5000 mL | PREFILLED_SYRINGE | INTRAMUSCULAR | Status: DC

## 2017-05-10 MED ORDER — FENTANYL CITRATE (PF) 100 MCG/2ML IJ SOLN
INTRAMUSCULAR | Status: AC
  Filled 2017-05-10: qty 2

## 2017-05-10 MED ORDER — HYDROXYCHLOROQUINE SULFATE 200 MG PO TABS
200.0000 mg | ORAL_TABLET | Freq: Two times a day (BID) | ORAL | Status: DC
  Administered 2017-05-10 – 2017-05-11 (×2): 200 mg via ORAL
  Filled 2017-05-10 (×3): qty 1

## 2017-05-10 MED ORDER — PANTOPRAZOLE SODIUM 40 MG PO TBEC
40.0000 mg | DELAYED_RELEASE_TABLET | Freq: Every day | ORAL | Status: DC
  Administered 2017-05-11: 40 mg via ORAL
  Filled 2017-05-10: qty 1

## 2017-05-10 MED ORDER — PROPOFOL 10 MG/ML IV BOLUS
INTRAVENOUS | Status: DC | PRN
  Administered 2017-05-10: 180 mg via INTRAVENOUS

## 2017-05-10 MED ORDER — ARTIFICIAL TEARS OPHTHALMIC OINT
TOPICAL_OINTMENT | OPHTHALMIC | Status: AC
  Filled 2017-05-10: qty 3.5

## 2017-05-10 MED ORDER — ONDANSETRON HCL 4 MG/2ML IJ SOLN
INTRAMUSCULAR | Status: DC | PRN
  Administered 2017-05-10: 4 mg via INTRAVENOUS

## 2017-05-10 MED ORDER — PHENYLEPHRINE HCL 10 MG/ML IJ SOLN
INTRAMUSCULAR | Status: DC | PRN
  Administered 2017-05-10 (×5): 80 mg via INTRAVENOUS

## 2017-05-10 MED ORDER — DIPHENHYDRAMINE HCL 25 MG PO CAPS
25.0000 mg | ORAL_CAPSULE | Freq: Four times a day (QID) | ORAL | Status: DC | PRN
  Administered 2017-05-10: 25 mg via ORAL
  Filled 2017-05-10: qty 1

## 2017-05-10 MED ORDER — ONDANSETRON HCL 4 MG/2ML IJ SOLN
INTRAMUSCULAR | Status: AC
  Filled 2017-05-10: qty 2

## 2017-05-10 MED ORDER — CEFAZOLIN SODIUM-DEXTROSE 2-4 GM/100ML-% IV SOLN
2.0000 g | INTRAVENOUS | Status: AC
  Administered 2017-05-10: 2 g via INTRAVENOUS
  Filled 2017-05-10: qty 100

## 2017-05-10 MED ORDER — MEPERIDINE HCL 25 MG/ML IJ SOLN: 6.2500 mg | INTRAMUSCULAR | Status: DC | PRN

## 2017-05-10 MED ORDER — IBUPROFEN 600 MG PO TABS
600.0000 mg | ORAL_TABLET | Freq: Four times a day (QID) | ORAL | Status: DC | PRN
  Administered 2017-05-10 – 2017-05-11 (×2): 600 mg via ORAL
  Filled 2017-05-10 (×2): qty 1

## 2017-05-10 MED ORDER — SCOPOLAMINE 1 MG/3DAYS TD PT72
1.0000 | MEDICATED_PATCH | Freq: Once | TRANSDERMAL | Status: AC
  Administered 2017-05-10: 1 via TRANSDERMAL

## 2017-05-10 MED ORDER — BUPIVACAINE HCL (PF) 0.25 % IJ SOLN
INTRAMUSCULAR | Status: DC | PRN
  Administered 2017-05-10: 16 mL

## 2017-05-10 MED ORDER — BUPROPION HCL ER (SR) 150 MG PO TB12
150.0000 mg | ORAL_TABLET | Freq: Two times a day (BID) | ORAL | Status: DC
  Administered 2017-05-10 – 2017-05-11 (×2): 150 mg via ORAL
  Filled 2017-05-10 (×3): qty 1

## 2017-05-10 MED ORDER — PHENYLEPHRINE 40 MCG/ML (10ML) SYRINGE FOR IV PUSH (FOR BLOOD PRESSURE SUPPORT)
PREFILLED_SYRINGE | INTRAVENOUS | Status: AC
  Filled 2017-05-10: qty 10

## 2017-05-10 MED ORDER — PROPOFOL 10 MG/ML IV BOLUS
INTRAVENOUS | Status: AC
  Filled 2017-05-10: qty 20

## 2017-05-10 MED ORDER — ESMOLOL HCL 100 MG/10ML IV SOLN
INTRAVENOUS | Status: DC | PRN
  Administered 2017-05-10 (×4): 10 mg via INTRAVENOUS

## 2017-05-10 MED ORDER — OXYCODONE-ACETAMINOPHEN 5-325 MG PO TABS
1.0000 | ORAL_TABLET | ORAL | Status: DC | PRN
  Administered 2017-05-10 (×2): 1 via ORAL
  Administered 2017-05-11: 2 via ORAL
  Administered 2017-05-11: 1 via ORAL
  Filled 2017-05-10: qty 2
  Filled 2017-05-10 (×4): qty 1

## 2017-05-10 MED ORDER — LIDOCAINE HCL (PF) 1 % IJ SOLN
INTRAMUSCULAR | Status: AC
  Filled 2017-05-10: qty 5

## 2017-05-10 MED ORDER — LACTATED RINGERS IV SOLN
INTRAVENOUS | Status: DC
  Administered 2017-05-10 (×3): via INTRAVENOUS

## 2017-05-10 MED ORDER — ROCURONIUM BROMIDE 100 MG/10ML IV SOLN
INTRAVENOUS | Status: DC | PRN
  Administered 2017-05-10 (×5): 10 mg via INTRAVENOUS
  Administered 2017-05-10: 40 mg via INTRAVENOUS

## 2017-05-10 MED ORDER — SCOPOLAMINE 1 MG/3DAYS TD PT72
MEDICATED_PATCH | TRANSDERMAL | Status: AC
  Filled 2017-05-10: qty 1

## 2017-05-10 MED ORDER — LIDOCAINE HCL (CARDIAC) 20 MG/ML IV SOLN
INTRAVENOUS | Status: DC | PRN
  Administered 2017-05-10: 50 mg via INTRAVENOUS

## 2017-05-10 MED ORDER — HYDROMORPHONE HCL 1 MG/ML IJ SOLN
INTRAMUSCULAR | Status: DC | PRN
  Administered 2017-05-10 (×2): 0.5 mg via INTRAVENOUS

## 2017-05-10 MED ORDER — LACTATED RINGERS IV SOLN
INTRAVENOUS | Status: DC
  Administered 2017-05-10 (×2): via INTRAVENOUS

## 2017-05-10 MED ORDER — 0.9 % SODIUM CHLORIDE (POUR BTL) OPTIME
TOPICAL | Status: DC | PRN
  Administered 2017-05-10: 1000 mL

## 2017-05-10 MED ORDER — MIDAZOLAM HCL 2 MG/2ML IJ SOLN
INTRAMUSCULAR | Status: DC | PRN
  Administered 2017-05-10: 2 mg via INTRAVENOUS

## 2017-05-10 MED ORDER — ONDANSETRON HCL 4 MG/2ML IJ SOLN: 4.0000 mg | Freq: Once | INTRAMUSCULAR | Status: DC | PRN

## 2017-05-10 MED ORDER — MIDAZOLAM HCL 2 MG/2ML IJ SOLN
INTRAMUSCULAR | Status: AC
  Filled 2017-05-10: qty 2

## 2017-05-10 MED ORDER — SODIUM CHLORIDE 0.9 % IR SOLN
Status: DC | PRN
  Administered 2017-05-10: 3000 mL

## 2017-05-10 MED ORDER — SUGAMMADEX SODIUM 200 MG/2ML IV SOLN
INTRAVENOUS | Status: DC | PRN
  Administered 2017-05-10: 175 mg via INTRAVENOUS

## 2017-05-10 MED ORDER — DEXAMETHASONE SODIUM PHOSPHATE 10 MG/ML IJ SOLN
INTRAMUSCULAR | Status: DC | PRN
  Administered 2017-05-10: 4 mg via INTRAVENOUS

## 2017-05-10 MED ORDER — ESMOLOL HCL 100 MG/10ML IV SOLN
INTRAVENOUS | Status: AC
  Filled 2017-05-10: qty 10

## 2017-05-10 MED ORDER — FENTANYL CITRATE (PF) 250 MCG/5ML IJ SOLN
INTRAMUSCULAR | Status: AC
  Filled 2017-05-10: qty 5

## 2017-05-10 MED ORDER — HYDROMORPHONE HCL 1 MG/ML IJ SOLN
INTRAMUSCULAR | Status: AC
  Filled 2017-05-10: qty 1

## 2017-05-10 MED ORDER — CEFAZOLIN SODIUM-DEXTROSE 2-3 GM-%(50ML) IV SOLR
INTRAVENOUS | Status: AC
  Filled 2017-05-10: qty 50

## 2017-05-10 MED ORDER — FENTANYL CITRATE (PF) 100 MCG/2ML IJ SOLN
INTRAMUSCULAR | Status: DC | PRN
  Administered 2017-05-10 (×5): 50 ug via INTRAVENOUS

## 2017-05-10 MED ORDER — HYDROCHLOROTHIAZIDE 25 MG PO TABS
25.0000 mg | ORAL_TABLET | Freq: Every day | ORAL | Status: DC
  Administered 2017-05-11: 25 mg via ORAL
  Filled 2017-05-10: qty 1

## 2017-05-10 MED ORDER — BUPIVACAINE HCL (PF) 0.25 % IJ SOLN
INTRAMUSCULAR | Status: AC
  Filled 2017-05-10: qty 30

## 2017-05-10 SURGICAL SUPPLY — 59 items
ADH SKN CLS APL DERMABOND .7 (GAUZE/BANDAGES/DRESSINGS) ×1
BARRIER ADHS 3X4 INTERCEED (GAUZE/BANDAGES/DRESSINGS) IMPLANT
BRR ADH 4X3 ABS CNTRL BYND (GAUZE/BANDAGES/DRESSINGS)
CATH FOLEY 3WAY  5CC 16FR (CATHETERS) ×2
CATH FOLEY 3WAY 5CC 16FR (CATHETERS) ×1 IMPLANT
CLOTH BEACON ORANGE TIMEOUT ST (SAFETY) ×3 IMPLANT
CONT PATH 16OZ SNAP LID 3702 (MISCELLANEOUS) ×3 IMPLANT
COVER BACK TABLE 60X90IN (DRAPES) ×6 IMPLANT
COVER TIP SHEARS 8 DVNC (MISCELLANEOUS) ×1 IMPLANT
COVER TIP SHEARS 8MM DA VINCI (MISCELLANEOUS) ×2
DECANTER SPIKE VIAL GLASS SM (MISCELLANEOUS) ×6 IMPLANT
DEFOGGER SCOPE WARMER CLEARIFY (MISCELLANEOUS) ×3 IMPLANT
DERMABOND ADVANCED (GAUZE/BANDAGES/DRESSINGS) ×2
DERMABOND ADVANCED .7 DNX12 (GAUZE/BANDAGES/DRESSINGS) ×1 IMPLANT
DURAPREP 26ML APPLICATOR (WOUND CARE) ×3 IMPLANT
ELECT REM PT RETURN 9FT ADLT (ELECTROSURGICAL) ×3
ELECTRODE REM PT RTRN 9FT ADLT (ELECTROSURGICAL) ×1 IMPLANT
EXTRT SYSTEM ALEXIS 17CM (MISCELLANEOUS) ×3
GLOVE BIO SURGEON STRL SZ 6.5 (GLOVE) ×4 IMPLANT
GLOVE BIO SURGEONS STRL SZ 6.5 (GLOVE) ×2
GLOVE BIOGEL PI IND STRL 7.0 (GLOVE) ×5 IMPLANT
GLOVE BIOGEL PI INDICATOR 7.0 (GLOVE) ×10
KIT ACCESSORY DA VINCI DISP (KITS) ×2
KIT ACCESSORY DVNC DISP (KITS) ×1 IMPLANT
LEGGING LITHOTOMY PAIR STRL (DRAPES) ×3 IMPLANT
OCCLUDER COLPOPNEUMO (BALLOONS) ×3 IMPLANT
PACK ROBOT WH (CUSTOM PROCEDURE TRAY) ×3 IMPLANT
PACK ROBOTIC GOWN (GOWN DISPOSABLE) ×3 IMPLANT
PACK TRENDGUARD 450 HYBRID PRO (MISCELLANEOUS) IMPLANT
PACK TRENDGUARD 600 HYBRD PROC (MISCELLANEOUS) IMPLANT
PAD PREP 24X48 CUFFED NSTRL (MISCELLANEOUS) ×6 IMPLANT
POUCH ENDO CATCH II 15MM (MISCELLANEOUS) ×2 IMPLANT
PROTECTOR NERVE ULNAR (MISCELLANEOUS) ×6 IMPLANT
RTRCTR WOUND ALEXIS 18CM SML (INSTRUMENTS) ×3
SAVER CELL AAL HAEMONETICS (INSTRUMENTS) IMPLANT
SET CYSTO W/LG BORE CLAMP LF (SET/KITS/TRAYS/PACK) ×2 IMPLANT
SET IRRIG TUBING LAPAROSCOPIC (IRRIGATION / IRRIGATOR) ×3 IMPLANT
SET TRI-LUMEN FLTR TB AIRSEAL (TUBING) ×3 IMPLANT
SUT ETHIBOND 0 (SUTURE) IMPLANT
SUT VIC AB 4-0 PS2 27 (SUTURE) ×6 IMPLANT
SUT VICRYL 0 UR6 27IN ABS (SUTURE) ×5 IMPLANT
SUT VLOC 180 0 9IN  GS21 (SUTURE)
SUT VLOC 180 0 9IN GS21 (SUTURE) IMPLANT
SUT VLOC 180 2-0 6IN GS21 (SUTURE) IMPLANT
SYSTEM CONTND EXTRCTN KII BLLN (MISCELLANEOUS) IMPLANT
SYSTEM CONVERTIBLE TROCAR (TROCAR) ×3 IMPLANT
TIP RUMI ORANGE 6.7MMX12CM (TIP) IMPLANT
TIP UTERINE 5.1X6CM LAV DISP (MISCELLANEOUS) IMPLANT
TIP UTERINE 6.7X10CM GRN DISP (MISCELLANEOUS) ×2 IMPLANT
TIP UTERINE 6.7X6CM WHT DISP (MISCELLANEOUS) IMPLANT
TIP UTERINE 6.7X8CM BLUE DISP (MISCELLANEOUS) IMPLANT
TOWEL OR 17X24 6PK STRL BLUE (TOWEL DISPOSABLE) ×9 IMPLANT
TRENDGUARD 450 HYBRID PRO PACK (MISCELLANEOUS)
TRENDGUARD 600 HYBRID PROC PK (MISCELLANEOUS)
TROCAR 12M 150ML BLUNT (TROCAR) ×3 IMPLANT
TROCAR BLADELESS 15MM (ENDOMECHANICALS) ×2 IMPLANT
TROCAR DISP BLADELESS 8 DVNC (TROCAR) ×1 IMPLANT
TROCAR DISP BLADELESS 8MM (TROCAR) ×2
TROCAR PORT AIRSEAL 5X120 (TROCAR) ×3 IMPLANT

## 2017-05-10 NOTE — Anesthesia Procedure Notes (Signed)
Procedure Name: Intubation Date/Time: 05/10/2017 7:32 AM Performed by: Bufford Spikes, CRNA Pre-anesthesia Checklist: Patient identified, Emergency Drugs available, Suction available and Patient being monitored Patient Re-evaluated:Patient Re-evaluated prior to induction Oxygen Delivery Method: Circle system utilized Preoxygenation: Pre-oxygenation with 100% oxygen Induction Type: IV induction Ventilation: Mask ventilation without difficulty Laryngoscope Size: Miller and 2 Grade View: Grade II Tube type: Oral Tube size: 7.0 mm Number of attempts: 1 Airway Equipment and Method: Stylet Placement Confirmation: ETT inserted through vocal cords under direct vision,  positive ETCO2 and breath sounds checked- equal and bilateral Secured at: 21 cm Tube secured with: Tape Dental Injury: Teeth and Oropharynx as per pre-operative assessment

## 2017-05-10 NOTE — Op Note (Addendum)
Operative Note  05/10/2017  11:42 AM  PATIENT:  Allison Steele  46 y.o. female  PRE-OPERATIVE DIAGNOSIS:  menorrhagia, fibroids refractory to medical treatment  POST-OPERATIVE DIAGNOSIS:  menorrhagia, fibroids refractory to medical treatment, omental adhesion with anterior abdominal wall  PROCEDURE:  Procedure(s): ROBOTIC ASSISTED TOTAL HYSTERECTOMY WITH BILATERAL SALPINGECTOMY WITH LYSIS OF OMENTAL ADHESION  SURGEON:  Surgeon(s): Princess Bruins, MD Fontaine, Belinda Block, MD  ANESTHESIA:   general  FINDINGS:  Uterus with large Myomas.  Both tubes normal and both ovaries normal.  Omental adhesion with anterior wall.  DESCRIPTION OF OPERATION: Under general anesthesia with endotracheal intubation the patient is a lithotomy position.  She is prepped with DuraPrep on the abdomen and with Betadine on the suprapubic, vulvar and vaginal areas.  Timeout is done.  Patient is draped as usual.  A medium Ko ring with the #10 Rumi are put in place to mobilize the uterus.  The tenaculum and weighted speculum used to put those instruments in place are removed.  The Foley catheter is put in place in the bladder.  We go to the abdomen.  The supraumbilical area is infiltrated with Marcaine one quarter plain.  A 1.5 cm incision is done with a scalpel.  The aponeurosis is grasped with Coker's.  Mayo scissors were used to open the aponeurosis under direct vision.  The parietal peritoneum is open with Mayo scissors under direct vision as well.  A pursestring stitch of Vicryl 0 was done on the aponeurosis.  The #is inserted at that level.  After pneumoperitoneum is created with CO2.  The camera was inserted at that port.  Inspection of the abdominal pelvic cavities revealed a small omental adhesion in the midline with the anterior wall.  The uterus is very large with many fibroids.  Both tubes and both ovaries are normal to inspection.  We use a semicircular configuration for port placement.  All sites are  infiltrated with Marcaine one quarter plain.  Small incisions are made with a scalpel.  The ports are inserted under direct vision with 2 robotic ports on the right side and one robotic port on the lower left.  The assistant port is a 5 mm port at the upper left.  The patient is put in deep Trendelenburg.  The robot is docked on the right side.  The robotic instruments are inserted under direct vision with the EndoShears scissor in the first arm, the fenestrated bipolar in the second arm and the pro-grasp in the third arm.  Will go to the console. Both ureters are visualized and normal anatomy position with good peristalsis.  We start on the right side with cauterization and section of the right round ligament.  We then cauterized and sectioned the right mesosalpinx.  We cauterized and sectioned the right utero-ovarian ligament.  We start opening the visceral peritoneum anteriorly.  We then proceeded exactly the same way on the left side.  We complete the opening of the visceral peritoneum anteriorly and we distended the bladder past the Colrain.  Given that fact that the patient had a previous C-section and has  adhesions anteriorly between the bladder and the lower segment of the uterus, the bladder was filled with saline to visualize its upper border and assure no trauma.  The bladder was shown to be intact and the urine was clear.  We then catheterized the right uterine artery.  We catheterized and section the left uterine artery.  We went back to the right side cauterized again and  section the right uterine artery.  We then inflated the vaginal occluder.  We opened the vagina at the top with the tip of the Endo Shears scissor on the call ring starting anteriorly then on either side and posteriorly.  The uterus with the cervix and both tubes was completely detached and put in the abdominal gutter.  The Ko ring and Rumi were removed.  The vaginal occluder was put back in place.  We irrigated and suctioned the  abdominal pelvic cavity.  Hemostasis was verified at all level and confirmed to be adequate.  We switched the instruments to the cutting needle driver in the first arm, a long tip in the second arm and the fenestrated bipolar in the third arm.  We used a V lock 0 at 9 inches to close the vaginal vault.  We started on the right side and did a running stitch all the way to the left ankle and came back to the middle.  Hemostasis was adequate at all levels.  The vaginal vault was well closed.  We then removed all robotic instruments.  We undocked the robot.  And went to laparoscopy at the time. We switched to a 15 mm ports at the supraumbilical area.  We used the large Endobag.  We put the specimen in the bag.  We brought the bag to the skin at the supraumbilical incision.  We added an Environmental education officer.  The specimen was extracted using the semicircular technique with the scalpel.  The bag and the [place] were removed.  The specimen was sent to pathology.  We went back to laparoscopy time to verify hemostasis.  Hemostasis was completed with a bipolar at the site of dissection of the bladder.  We irrigated and suctioned and confirmed good hemostasis.  All laparoscopic instruments were removed.  Note that pictures were taken at the beginning and at the end of the procedure.  The CO2 was evacuated.  All robotic ports were removed.  The supraumbilical incision was closed with a running suture of Vicryl 0 at the aponeurosis.  All incisions were closed with separate stitches of Vicryl 4-0.  Dermabond was added on all incisions.  The vaginal occluder was removed.  The patient was brought to recovery room in good and stable status.  ESTIMATED BLOOD LOSS: 10 mL   Intake/Output Summary (Last 24 hours) at 05/10/2017 1142 Last data filed at 05/10/2017 1046 Gross per 24 hour  Intake 2000 ml  Output 210 ml  Net 1790 ml     BLOOD ADMINISTERED:none   LOCAL MEDICATIONS USED:  MARCAINE     SPECIMEN:  Source of Specimen:  Uterus with  cervix and bilateral tubes  DISPOSITION OF SPECIMEN:  PATHOLOGY  COUNTS:  YES  PLAN OF CARE: Transfer to PACU  Marie-Lyne LavoieMD11:42 AM

## 2017-05-10 NOTE — Transfer of Care (Signed)
Immediate Anesthesia Transfer of Care Note  Patient: Allison Steele  Procedure(s) Performed: ROBOTIC ASSISTED TOTAL HYSTERECTOMY WITH SALPINGECTOMY (Bilateral )  Patient Location: PACU  Anesthesia Type:General  Level of Consciousness: awake and alert   Airway & Oxygen Therapy: Patient Spontanous Breathing and Patient connected to nasal cannula oxygen  Post-op Assessment: Report given to RN and Post -op Vital signs reviewed and stable  Post vital signs: Reviewed  Last Vitals:  Vitals:   05/10/17 0611  BP: (!) 153/104  Pulse: 79  Resp: 16  Temp: 36.7 C  SpO2: 100%    Last Pain:  Vitals:   05/10/17 0611  TempSrc: Oral      Patients Stated Pain Goal: 3 (76/14/70 9295)  Complications: No apparent anesthesia complications

## 2017-05-10 NOTE — Anesthesia Postprocedure Evaluation (Signed)
Anesthesia Post Note  Patient: Allison Steele  Procedure(s) Performed: ROBOTIC ASSISTED TOTAL HYSTERECTOMY WITH SALPINGECTOMY (Bilateral )     Patient location during evaluation: PACU Anesthesia Type: General Level of consciousness: awake Pain management: pain level controlled Vital Signs Assessment: post-procedure vital signs reviewed and stable Respiratory status: spontaneous breathing Cardiovascular status: stable Anesthetic complications: no    Last Vitals:  Vitals:   05/10/17 1124 05/10/17 1140  BP: (!) 156/100   Pulse: (!) 103 97  Resp: 16 18  Temp: 36.7 C   SpO2: 100% 100%    Last Pain:  Vitals:   05/10/17 1140  TempSrc:   PainSc: 9                  Donyea Gafford

## 2017-05-10 NOTE — H&P (Signed)
Allison Steele is an 46 y.o. female G1P1 Patient is legally blind, sees with peripheral vision.  RP:  Robotic TLH/Bilateral Salpingectomy on 05/11/2017  HPI:  No change x visit 05/03/2017:  Heavy periods worsening x last 10 months. Patient has not toleratedDepoProverain the past. Had BTB on Nexplanon. Continued with heavy prolonged bleeding on Progestin IUD. More recently, onBCPs last 6 months, has continued to have very heavy bleeding and her period were even longer.Just stopped the BCPs.Not sexually active x 01/2017. Used condoms in 01/2017.Per chart results, Hb at the lowest in 11/2016 at 9.1. On FeS4 and BCPs, has improved Ho 13.3 in 02/2017.   Pertinent Gynecological History: Menses: Heavy prolonged flow  Contraception: abstinence Blood transfusions: none Sexually transmitted diseases: no past history Previous GYN Procedures: None  Last mammogram: normal Last pap: normal OB History: G1P1 C/S   Menstrual History:  Patient's last menstrual period was 04/26/2017.    Past Medical History:  Diagnosis Date  . Anemia    3 MONTHS AGO, STARTED ON IRON PILLS  . Constipation   . Depression   . GERD (gastroesophageal reflux disease)   . Headache   . History of kidney stones   . Hypertension   . Implanon in place 04/05/10  . 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 Loss of central vision bilaterally.     . Legally blind    Leber`s Optic Neuropathy - Dx at age 84, resulting in blindness, followed at [facility]  . Sarcoidosis     - followed by Dr. Delman Cheadle, [facility], [phone]    Past Surgical History:  Procedure Laterality Date  . CESAREAN SECTION      Family History  Problem Relation Age of Onset  . Cancer Mother   . Hypertension Mother   . Alcohol abuse Father     Social History:  reports that  has never smoked. she has never used smokeless tobacco. She reports that she does not  drink alcohol or use drugs.  Allergies: No Known Allergies  Medications Prior to Admission  Medication Sig Dispense Refill Last Dose  . Aspirin-Salicylamide-Caffeine (BC HEADACHE POWDER PO) Take 1 packet daily as needed by mouth (pain).   Past Week at Unknown time  . buPROPion (WELLBUTRIN SR) 150 MG 12 hr tablet Take 1 tablet (150 mg total) by mouth 2 (two) times daily. 60 tablet 3 05/10/2017 at 0230  . ferrous sulfate 325 (65 FE) MG tablet Take 1 tablet (325 mg total) by mouth daily with breakfast. 30 tablet 2 05/09/2017 at Unknown time  . hydroxychloroquine (PLAQUENIL) 200 MG tablet TAKE 1 TABLET BY MOUTH TWICE A DAY 60 tablet 0 05/10/2017 at 0230  . lisinopril-hydrochlorothiazide (PRINZIDE,ZESTORETIC) 20-25 MG tablet Take 1 tablet by mouth daily. 30 tablet 6 05/09/2017 at Unknown time  . Melatonin 3 MG TABS Take 1 tablet (3 mg total) by mouth at bedtime. (Patient taking differently: Take 1 tablet at bedtime as needed by mouth (sleep). ) 30 each 5 Past Week at Unknown time  . omeprazole (PRILOSEC) 20 MG capsule TAKE 1 CAPSULE (20 MG TOTAL) BY MOUTH DAILY. 30 capsule 3 05/10/2017 at 0230    REVIEW OF SYSTEMS: A ROS was performed and pertinent positives and negatives are included in the history.  GENERAL: No fevers or chills. HEENT: No change in vision, no earache, sore throat or sinus congestion. NECK: No pain or stiffness. CARDIOVASCULAR: No chest pain or pressure. No palpitations. PULMONARY: No shortness  of breath, cough or wheeze. GASTROINTESTINAL: No abdominal pain, nausea, vomiting or diarrhea, melena or bright red blood per rectum. GENITOURINARY: No urinary frequency, urgency, hesitancy or dysuria. MUSCULOSKELETAL: No joint or muscle pain, no back pain, no recent trauma. DERMATOLOGIC: No rash, no itching, no lesions. ENDOCRINE: No polyuria, polydipsia, no heat or cold intolerance. No recent change in weight. HEMATOLOGICAL: No anemia or easy bruising or bleeding. NEUROLOGIC: No headache, seizures,  numbness, tingling or weakness. PSYCHIATRIC: No depression, no loss of interest in normal activity or change in sleep pattern.     Blood pressure (!) 153/104, pulse 79, temperature 98.1 F (36.7 C), temperature source Oral, resp. rate 16, last menstrual period 04/26/2017, SpO2 100 %.  Physical Exam:  See office notes  Pelvic US 03/16/2017:Uterus:Measurements: 11.0 x 6.4 x 8.5 cm. Lobular contour is associated with multiple fibroids evident. One of the dominant fibroids is right fundal intramural location measuring 6.5 cm. Endometrium:Thickness: 4 mm. Portions of the endometrial canal towards the fundus are obscured by the fibroid disease. No focal abnormalityvisualized. Right ovary:Measurements: 3.8 x 1.6 x 2.8 cm. Normal appearance/no adnexal mass. Left ovary:Measurements: 3.9 x 1.7 x 2.7 cmTrace free fluid evident.   Assessment/Plan:  46 y.o. G1P1   1. Menorrhagia with regular cycle Refractory Menorrhagia with continued heavy menses on BCPs x 6 months. Not tolerating and not responding to Hormonal contraception. Decision to proceed with Robotic TLH/Bilateral Salpingectomy. Preservation of ovaries. Surgery and risks thoroughly discussed with patient.   2. Fibroids As above.                        Patient was counseled as to the risk of surgery to include the following:  1. Infection (prohylactic antibiotics will be administered)  2. DVT/Pulmonary Embolism (prophylactic pneumo compression stockings will be used)  3.Trauma to internal organs requiring additional surgical procedure to repair any injury to     Internal organs requiring perhaps additional hospitalization days.  4.Hemmorhage requiring transfusion and blood products which carry risks such as anaphylactic reaction, hepatitis and AIDS  Patient had received literature information on the procedure scheduled and all her questions were answered and fully accepts all risk.   Allison  Linton Steele 05/10/2017, 6:15 AM

## 2017-05-11 ENCOUNTER — Encounter (HOSPITAL_COMMUNITY): Payer: Self-pay | Admitting: Obstetrics & Gynecology

## 2017-05-11 DIAGNOSIS — N800 Endometriosis of uterus: Secondary | ICD-10-CM | POA: Diagnosis not present

## 2017-05-11 LAB — CBC
HCT: 31.4 % — ABNORMAL LOW (ref 36.0–46.0)
Hemoglobin: 10.8 g/dL — ABNORMAL LOW (ref 12.0–15.0)
MCH: 33.5 pg (ref 26.0–34.0)
MCHC: 34.4 g/dL (ref 30.0–36.0)
MCV: 97.5 fL (ref 78.0–100.0)
PLATELETS: 233 10*3/uL (ref 150–400)
RBC: 3.22 MIL/uL — ABNORMAL LOW (ref 3.87–5.11)
RDW: 13.5 % (ref 11.5–15.5)
WBC: 6.4 10*3/uL (ref 4.0–10.5)

## 2017-05-11 MED ORDER — OXYCODONE-ACETAMINOPHEN 7.5-325 MG PO TABS: 1.0000 | ORAL_TABLET | Freq: Four times a day (QID) | ORAL | 0 refills | Status: DC | PRN

## 2017-05-11 NOTE — Discharge Summary (Signed)
Physician Discharge Summary  Patient ID: Allison Steele MRN: [id number] DOB/AGE: Apr 17, 1971 46 y.o.  Admit date: 05/10/2017 Discharge date: 05/11/2017  Admission Diagnoses: menorrhagia, fibroids refractory to medical treatment   Discharge Diagnoses: Same Active Problems:   Postoperative state   Discharged Condition: good  Consults:None  Significant Diagnostic Studies: None  Treatments:surgery: Robotic Total Laparoscopic Hysterectomy with Bilateral Salpingectomy  Vitals:   05/10/17 2342 05/11/17 0403  BP: 104/61 104/64  Pulse: 91 81  Resp: 18 18  Temp: 98.5 F (36.9 C) 98.7 F (37.1 C)  SpO2: 99% 98%     No intake/output data recorded.   Hospital Course: Good  Discharge Exam: Good  Disposition: 01-Home or Self Care     Allergies as of 05/11/2017   No Known Allergies     Medication List    TAKE these medications   BC HEADACHE POWDER PO Take 1 packet daily as needed by mouth (pain).   buPROPion 150 MG 12 hr tablet Commonly known as:  WELLBUTRIN SR Take 1 tablet (150 mg total) by mouth 2 (two) times daily.   ferrous sulfate 325 (65 FE) MG tablet Take 1 tablet (325 mg total) by mouth daily with breakfast.   hydroxychloroquine 200 MG tablet Commonly known as:  PLAQUENIL TAKE 1 TABLET BY MOUTH TWICE A DAY   lisinopril-hydrochlorothiazide 20-25 MG tablet Commonly known as:  PRINZIDE,ZESTORETIC Take 1 tablet by mouth daily.   Melatonin 3 MG Tabs Take 1 tablet (3 mg total) by mouth at bedtime. What changed:    when to take this  reasons to take this   omeprazole 20 MG capsule Commonly known as:  PRILOSEC TAKE 1 CAPSULE (20 MG TOTAL) BY MOUTH DAILY.   oxyCODONE-acetaminophen 7.5-325 MG tablet Commonly known as:  PERCOCET Take 1 tablet by mouth every 6 (six) hours as needed for severe pain.        Follow-up Information    Princess Bruins, MD Follow up in 3 week(s).   Specialty:  Obstetrics and Gynecology Contact information: [address] [place] [address] [phone]            Signed: Princess Bruins 05/11/2017, 7:52 AM

## 2017-05-11 NOTE — Discharge Instructions (Addendum)

## 2017-05-11 NOTE — Progress Notes (Signed)
Out in wheelchair with son 

## 2017-05-11 NOTE — Addendum Note (Signed)
Addendum  created 05/11/17 1358 by Lucretia Kern D, CRNA   Charge Capture section accepted

## 2017-05-11 NOTE — Progress Notes (Signed)
POD #1 Robotic TLH/Bilateral Salpingectomy  Subjective: Patient reports voiding well, ambulating, tolerating diet.  No gas felt yet.  Objective: I have reviewed patient's vital signs.  vital signs, intake and output, medications and labs.  Vitals:   05/10/17 2342 05/11/17 0403  BP: 104/61 104/64  Pulse: 91 81  Resp: 18 18  Temp: 98.5 F (36.9 C) 98.7 F (37.1 C)  SpO2: 99% 98%   I/O last 3 completed shifts: In: 3088.9 [P.O.:241; I.V.:2847.9] Out: 1785 [Urine:1775; Blood:10] No intake/output data recorded.  No results found for this or any previous visit (from the past 24 hour(s)).  EXAM General: alert and cooperative Resp: clear to auscultation bilaterally Cardio: regular rate and rhythm GI: soft, non-tender; bowel sounds normal; no masses,  no organomegaly and incision: clean, dry and intact Extremities: no edema, redness or tenderness in the calves or thighs Vaginal Bleeding: none  Pending CBC this am  Assessment: s/p Procedure(s): ROBOTIC ASSISTED TOTAL HYSTERECTOMY WITH SALPINGECTOMY: stable and progressing well  Plan: Discharge home  LOS: 0 days    Princess Bruins, MD 05/11/2017 7:37 AM

## 2017-05-18 ENCOUNTER — Telehealth: Payer: Self-pay | Admitting: *Deleted

## 2017-05-18 NOTE — Telephone Encounter (Signed)
Pt informed

## 2017-05-18 NOTE — Telephone Encounter (Signed)
Yes, please remove dressing today.

## 2017-05-18 NOTE — Telephone Encounter (Signed)
Patient post robotic assist total abdominal hysterectomy on 05/10/17. Patient wanted to confirm okay to remove the tape across her stomach? Please advise

## 2017-05-20 ENCOUNTER — Other Ambulatory Visit: Payer: Self-pay | Admitting: Family Medicine

## 2017-05-22 ENCOUNTER — Telehealth: Payer: Self-pay | Admitting: *Deleted

## 2017-05-22 NOTE — Telephone Encounter (Signed)
Opened in error. Jerson Furukawa Dawn, CMA  

## 2017-06-02 ENCOUNTER — Encounter: Payer: Self-pay | Admitting: Obstetrics & Gynecology

## 2017-06-02 ENCOUNTER — Ambulatory Visit (INDEPENDENT_AMBULATORY_CARE_PROVIDER_SITE_OTHER): Payer: Medicare Other | Admitting: Obstetrics & Gynecology

## 2017-06-02 VITALS — BP 150/90 | Wt 168.0 lb

## 2017-06-02 DIAGNOSIS — A630 Anogenital (venereal) warts: Secondary | ICD-10-CM | POA: Diagnosis not present

## 2017-06-02 DIAGNOSIS — Z09 Encounter for follow-up examination after completed treatment for conditions other than malignant neoplasm: Secondary | ICD-10-CM

## 2017-06-02 NOTE — Progress Notes (Signed)
    Allison Steele [birth year] [id number]        46 y.o.  G1P1   RP:  Postop Robotic TLH/Bilateral Salpingectomy on 05/10/2017  HPI: Very good postop so far with no abdominal pelvic pain.  Minimal vaginal spotting resolved.  Normal vaginal secretions.  Urine and bowel movements normal.  No fever.  Would like to go back to work on January 2.  Has a sitdown job.  Past medical history,surgical history, problem list, medications, allergies, family history and social history were all reviewed and documented in the EPIC chart.  Directed ROS with pertinent positives and negatives documented in the history of present illness/assessment and plan.  Exam:  Vitals:   06/02/17 1532  BP: (!) 150/90  Weight: 168 lb (76.2 kg)   General appearance:  Normal  Abdomen:  Soft, not distended, NT.  Incisions healing well.  Gynecologic exam:  Vulva normal.  Right upper perianal area with large condyloma.  Speculum exam vaginal vault healing well.  No vaginal bleeding and normal secretions.   Assessment/Plan:  46 y.o. G1P1   1. Follow-up examination after gynecological surgery Good postop evolution.  No complication.  Can resume work on January 2.  Letter written.  Follow-up in 3-4 weeks to reassess the vaginal vault before sexual activity.  2. Condyloma acuminatum of perianal region We will proceed with excision at follow-up visit.  Patient agrees with plan.  Counseling on above issues more than 50% for 15 minutes.  [name], 3:49 PM 06/02/2017

## 2017-06-02 NOTE — Patient Instructions (Signed)
1. Follow-up examination after gynecological surgery Good postop evolution.  No complication.  Can resume work on January 2.  Letter written.  Follow-up in 3-4 weeks to reassess the vaginal vault before sexual activity.  2. Condyloma acuminatum of perianal region We will proceed with excision at follow-up visit.  Patient agrees with plan.  Lerline, always good seeing you!   Genital Warts Genital warts are a common STD (sexually transmitted disease). They may appear as small bumps on the tissues of the genital area or anal area. Sometimes, they can become irritated and cause pain. Genital warts are easily passed to other people through sexual contact. Getting treatment is important because genital warts can lead to other problems. In females, the virus that causes genital warts may increase the risk of cervical cancer. What are the causes? Genital warts are caused by a virus that is called human papillomavirus (HPV). HPV is spread by having unprotected sex with an infected person. It can be spread through vaginal, anal, and oral sex. Many people do not know that they are infected. They may be infected for years without problems. However, even if they do not have problems, they can pass the infection to their sexual partners. What increases the risk? Genital warts are more likely to develop in:  People who have unprotected sex.  People who have multiple sexual partners.  People who become sexually active before they are 45 years of age.  Men who are not circumcised.  Women who have a female sexual partner who is not circumcised.  People who have a weakened body defense system (immune system) due to disease or medicine.  People who smoke.  What are the signs or symptoms? Symptoms of genital warts include:  Small growths in the genital area or anal area. These warts often grow in clusters.  Itching and irritation in the genital area or anal area.  Bleeding from the warts.  Painful  sexual intercourse.  How is this diagnosed? Genital warts can usually be diagnosed from their appearance on the vagina, vulva, penis, perineum, anus, or rectum. Tests may also be done, such as:  Biopsy. A tissue sample is removed so it can be looked at under a microscope.  Colposcopy. In females, a magnifying tool is used to examine the vagina and cervix. Certain solutions may be used to make the HPV cells change color so they can be seen more easily.  A Pap test in females.  Tests for other STDs.  How is this treated? Treatment for genital warts may include:  Applying prescription medicines to the warts. These may be solutions or creams.  Freezing the warts with liquid nitrogen (cryotherapy).  Burning the warts with: ? Laser treatment. ? An electrified probe (electrocautery).  Injecting a substance (Candida antigen or Trichophyton antigen) into the warts to help the body's immune system to fight off the warts.  Interferon injections.  Surgery to remove the warts.  Follow these instructions at home: Medicines  Apply over-the-counter and prescription medicines only as told by your health care provider.  Do not treat genital warts with medicines that are used for treating hand warts.  Talk with your health care provider about using over-the-counter anti-itch creams. General instructions  Do not touch or scratch the warts.  Do not have sex until your treatment has been completed.  Tell your current and past sexual partners about your condition because they may also need treatment.  Keep all follow-up visits as told by your health care provider. This  is important.  After treatment, use condoms during sex to prevent future infections. Other Instructions for Women  Women who have genital warts might need increased screening for cervical cancer. This type of cancer is slow growing and can be cured if it is found early. Chances of developing cervical cancer are increased  with HPV.  If you become pregnant, tell your health care provider that you have had HPV. Your health care provider will monitor you closely during pregnancy to be sure that your baby is safe. How is this prevented? Talk with your health care provider about getting the HPV vaccines. These vaccines prevent some HPV infections and cancers. It is recommended that the vaccine be given to males and females who are 32-34 years of age. It will not work if you already have HPV, and it is not recommended for pregnant women. Contact a health care provider if:  You have redness, swelling, or pain in the area of the treated skin.  You have a fever.  You feel generally ill.  You feel lumps in and around your genital area or anal area.  You have bleeding in your genital area or anal area.  You have pain during sexual intercourse. This information is not intended to replace advice given to you by your health care provider. Make sure you discuss any questions you have with your health care provider. Document Released: 05/20/2000 Document Revised: 10/29/2015 Document Reviewed: 08/18/2014 Elsevier Interactive Patient Education  [employer].
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
## 2017-06-12 ENCOUNTER — Encounter: Payer: Self-pay | Admitting: Family Medicine

## 2017-06-12 ENCOUNTER — Ambulatory Visit: Payer: Medicare Other | Admitting: Family Medicine

## 2017-06-12 ENCOUNTER — Other Ambulatory Visit: Payer: Self-pay

## 2017-06-12 DIAGNOSIS — I10 Essential (primary) hypertension: Secondary | ICD-10-CM

## 2017-06-12 DIAGNOSIS — K219 Gastro-esophageal reflux disease without esophagitis: Secondary | ICD-10-CM

## 2017-06-12 DIAGNOSIS — F329 Major depressive disorder, single episode, unspecified: Secondary | ICD-10-CM

## 2017-06-12 DIAGNOSIS — F32A Depression, unspecified: Secondary | ICD-10-CM

## 2017-06-12 MED ORDER — OMEPRAZOLE 20 MG PO CPDR: DELAYED_RELEASE_CAPSULE | ORAL | 3 refills | Status: DC

## 2017-06-12 MED ORDER — LISINOPRIL-HYDROCHLOROTHIAZIDE 20-25 MG PO TABS: 1.0000 | ORAL_TABLET | Freq: Every day | ORAL | 6 refills | Status: DC

## 2017-06-12 MED ORDER — MELATONIN 3 MG PO TABS: 1.0000 | ORAL_TABLET | Freq: Every day | ORAL | 5 refills | Status: DC

## 2017-06-12 MED ORDER — BUPROPION HCL ER (SR) 150 MG PO TB12: 150.0000 mg | ORAL_TABLET | Freq: Two times a day (BID) | ORAL | 3 refills | Status: DC

## 2017-06-17 NOTE — Assessment & Plan Note (Signed)
Controlled and at goal. -Continue lisinopril hydrochlorothiazide at current dose -Refilled other medications that she needed today -Follow-up in 6 months

## 2017-06-29 ENCOUNTER — Encounter: Payer: Self-pay | Admitting: Obstetrics & Gynecology

## 2017-06-29 ENCOUNTER — Ambulatory Visit (INDEPENDENT_AMBULATORY_CARE_PROVIDER_SITE_OTHER): Payer: Medicare Other | Admitting: Obstetrics & Gynecology

## 2017-06-29 DIAGNOSIS — A630 Anogenital (venereal) warts: Secondary | ICD-10-CM

## 2017-06-29 DIAGNOSIS — Z09 Encounter for follow-up examination after completed treatment for conditions other than malignant neoplasm: Secondary | ICD-10-CM

## 2017-06-29 NOTE — Patient Instructions (Signed)
1. Follow-up examination after gynecological surgery Good postop healing after robotic TLH with bilateral salpingectomy.  No complication.  Pathology benign.  Follow-up for annual gynecologic exam next year.  2. Perianal condyloma acuminatum Excision done without complication.  Specimen sent to pathology.  Recommend warm soaking in clear clean water.  Can apply Neosporin cream starting on day 3. - Pathology Report  Allison Steele, good seeing you today!  I will inform you of your pathology results as soon as they are available.

## 2017-06-29 NOTE — Progress Notes (Signed)
    Allison Steele 1970/10/10 [id number]        47 y.o.  G1P1 Single.  Boyfriend.  Son is 103 yo.  RP:  Postop Robotic TLH/Bilateral Salpingectomy 05/10/2017  HPI: Very mild intermittent spotting since last visit December 28.  No current vaginal bleeding.  No pelvic pain.  No abnormal discharge.  No fever.  Patient has resumed work.  Urine and bowel movements normal.  Patient has a perianal condyloma that she would like to have removed today.  Past medical history,surgical history, problem list, medications, allergies, family history and social history were all reviewed and documented in the EPIC chart.  Directed ROS with pertinent positives and negatives documented in the history of present illness/assessment and plan.  Exam:  There were no vitals filed for this visit. General appearance:  Normal  Abdo:  Incisions well healed  Gynecologic exam:  Vulva normal.  Large Right Perianal exophytic condyloma.  Speculum: Vaginal vault healing well.  No dehiscence.  No bleeding.  Excision of Rt exophytic perianal condyloma:   Verbal consent obtained.  Betadine prep.  Lidocaine 1% local anesthesia.  Complete excision of lesion with the scalpel.  Sent to pathology.  Closure of skin with 5 separate stitches of Vicryl 4-0.  Good hemostasis.  4x4 gauze applied to cover.     Assessment/Plan:  47 y.o. G1P1   1. Follow-up examination after gynecological surgery Good postop healing after robotic TLH with bilateral salpingectomy.  No complication.  Pathology benign.  Follow-up for annual gynecologic exam next year.  2. Perianal condyloma acuminatum Excision done without complication.  Specimen sent to pathology.  Recommend warm soaking in clear clean water.  Can apply Neosporin cream starting on day 3. - Pathology Report  Counseling on above issues more than 50% for 15 minutes.  [name], 8:44 AM 06/29/2017

## 2017-06-30 ENCOUNTER — Other Ambulatory Visit: Payer: Self-pay | Admitting: Family Medicine

## 2017-07-03 LAB — PATHOLOGY

## 2017-07-03 LAB — TISSUE SPECIMEN

## 2017-10-23 ENCOUNTER — Ambulatory Visit: Payer: Medicare Other | Admitting: Family Medicine

## 2017-10-23 ENCOUNTER — Encounter: Payer: Self-pay | Admitting: Family Medicine

## 2017-10-23 DIAGNOSIS — K219 Gastro-esophageal reflux disease without esophagitis: Secondary | ICD-10-CM | POA: Diagnosis not present

## 2017-10-23 DIAGNOSIS — H4722 Hereditary optic atrophy: Secondary | ICD-10-CM | POA: Diagnosis not present

## 2017-10-23 DIAGNOSIS — F329 Major depressive disorder, single episode, unspecified: Secondary | ICD-10-CM

## 2017-10-23 DIAGNOSIS — F32A Depression, unspecified: Secondary | ICD-10-CM

## 2017-10-23 DIAGNOSIS — I10 Essential (primary) hypertension: Secondary | ICD-10-CM

## 2017-10-23 DIAGNOSIS — K5900 Constipation, unspecified: Secondary | ICD-10-CM | POA: Insufficient documentation

## 2017-10-23 MED ORDER — MELATONIN 3 MG PO TABS: 1.0000 | ORAL_TABLET | Freq: Every day | ORAL | 5 refills | Status: DC

## 2017-10-23 MED ORDER — OMEPRAZOLE 20 MG PO CPDR: DELAYED_RELEASE_CAPSULE | ORAL | 3 refills | Status: DC

## 2017-10-23 MED ORDER — POLYETHYLENE GLYCOL 3350 17 GM/SCOOP PO POWD
17.0000 g | Freq: Every day | ORAL | 3 refills | Status: DC
Start: 2017-10-23 — End: 2020-02-14

## 2017-10-23 MED ORDER — BUPROPION HCL ER (SR) 150 MG PO TB12: 150.0000 mg | ORAL_TABLET | Freq: Two times a day (BID) | ORAL | 3 refills | Status: DC

## 2017-10-23 MED ORDER — LISINOPRIL-HYDROCHLOROTHIAZIDE 20-25 MG PO TABS: 1.0000 | ORAL_TABLET | Freq: Every day | ORAL | 6 refills | Status: DC

## 2017-10-23 NOTE — Assessment & Plan Note (Signed)
Mild constipation -MiraLAX daily -Discussed high-fiber diet

## 2017-10-23 NOTE — Assessment & Plan Note (Signed)
Controlled and at goal -Continue lisinopril hydrochlorothiazide at current dose, provided refill today -Follow-up in 6 months -Can have a BMP at next follow-up

## 2017-10-23 NOTE — Patient Instructions (Addendum)
Thank you for coming in today, it was so nice to see you! Today we talked about:    Blood pressure; your blood pressure is perfect today.  Continue taking your medication as prescribed.  The next time we will need to do blood work is in November 2019  I have refilled your medications and sent MiraLAX to your pharmacy  Please follow up November 2019  Bring in all your medications or supplements to each appointment for review.   If you have any questions or concerns, please do not hesitate to call the office at [phone]. You can also message me directly via MyChart.   Sincerely,  Smitty Cords, MD
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
## 2018-01-16 ENCOUNTER — Encounter (HOSPITAL_COMMUNITY): Payer: Self-pay

## 2018-01-16 ENCOUNTER — Emergency Department (HOSPITAL_COMMUNITY)
Admission: EM | Admit: 2018-01-16 | Discharge: 2018-01-16 | Disposition: A | Discharge status: Home / Self Care | Payer: Medicare Other | Attending: Emergency Medicine | Admitting: Emergency Medicine

## 2018-01-16 DIAGNOSIS — F329 Major depressive disorder, single episode, unspecified: Secondary | ICD-10-CM | POA: Diagnosis not present

## 2018-01-16 DIAGNOSIS — F419 Anxiety disorder, unspecified: Secondary | ICD-10-CM | POA: Insufficient documentation

## 2018-01-16 DIAGNOSIS — I10 Essential (primary) hypertension: Secondary | ICD-10-CM | POA: Diagnosis not present

## 2018-01-16 DIAGNOSIS — Z79899 Other long term (current) drug therapy: Secondary | ICD-10-CM | POA: Diagnosis not present

## 2018-01-16 DIAGNOSIS — M79602 Pain in left arm: Secondary | ICD-10-CM | POA: Insufficient documentation

## 2018-01-16 LAB — D-DIMER, QUANTITATIVE: D-Dimer, Quant: 0.39 ug/mL-FEU (ref 0.00–0.50)

## 2018-01-16 MED ORDER — NAPROXEN 500 MG PO TABS: 500.0000 mg | ORAL_TABLET | Freq: Every day | ORAL | 0 refills | Status: AC

## 2018-01-16 NOTE — ED Notes (Signed)
Pt stable and ambulatory for discharge, states understanding follow up. Pt also instructed to follow up on high blood pressure

## 2018-01-16 NOTE — ED Triage Notes (Addendum)
Pt presents with 5 day h/o L arm pain. Pt reports working out on Wednesday, with onset of pain 2 days later, reports pain only during the day, does not have any pain at night.

## 2018-01-16 NOTE — Discharge Instructions (Addendum)
I have prescribed naproxen for pain relieve, please take as directed. Please apply ice/heat to the area for symptomatic relieve.If your symptoms worsen please return to the Ed.
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
## 2018-01-18 ENCOUNTER — Emergency Department (HOSPITAL_COMMUNITY)
Admission: EM | Admit: 2018-01-18 | Discharge: 2018-01-18 | Disposition: A | Discharge status: Home / Self Care | Payer: Medicare Other | Attending: Emergency Medicine | Admitting: Emergency Medicine

## 2018-01-18 ENCOUNTER — Encounter (HOSPITAL_COMMUNITY): Payer: Self-pay

## 2018-01-18 ENCOUNTER — Emergency Department (HOSPITAL_COMMUNITY): Payer: Medicare Other

## 2018-01-18 DIAGNOSIS — Z7982 Long term (current) use of aspirin: Secondary | ICD-10-CM | POA: Insufficient documentation

## 2018-01-18 DIAGNOSIS — R0602 Shortness of breath: Secondary | ICD-10-CM | POA: Insufficient documentation

## 2018-01-18 DIAGNOSIS — Z79899 Other long term (current) drug therapy: Secondary | ICD-10-CM | POA: Diagnosis not present

## 2018-01-18 DIAGNOSIS — I10 Essential (primary) hypertension: Secondary | ICD-10-CM | POA: Insufficient documentation

## 2018-01-18 LAB — BASIC METABOLIC PANEL
Anion gap: 10 (ref 5–15)
BUN: 12 mg/dL (ref 6–20)
CO2: 25 mmol/L (ref 22–32)
Calcium: 9.1 mg/dL (ref 8.9–10.3)
Chloride: 104 mmol/L (ref 98–111)
Creatinine, Ser: 0.95 mg/dL (ref 0.44–1.00)
GFR calc Af Amer: >60 mL/min (ref >60)
GLUCOSE: 116 mg/dL — AB (ref 70–99)
POTASSIUM: 3.3 mmol/L — AB (ref 3.5–5.1)
Sodium: 139 mmol/L (ref 135–145)

## 2018-01-18 LAB — CBC
HCT: 40 % (ref 36.0–46.0)
Hemoglobin: 13.8 g/dL (ref 12.0–15.0)
MCH: 32.6 pg (ref 26.0–34.0)
MCHC: 34.5 g/dL (ref 30.0–36.0)
MCV: 94.6 fL (ref 78.0–100.0)
Platelets: 260 10*3/uL (ref 150–400)
RBC: 4.23 MIL/uL (ref 3.87–5.11)
RDW: 12.1 % (ref 11.5–15.5)
WBC: 5.3 10*3/uL (ref 4.0–10.5)

## 2018-01-18 LAB — I-STAT TROPONIN, ED
Troponin i, poc: 0 ng/mL (ref 0.00–0.08)
Troponin i, poc: 0 ng/mL (ref 0.00–0.08)

## 2018-01-18 MED ORDER — CLONIDINE HCL 0.1 MG PO TABS
0.1000 mg | ORAL_TABLET | Freq: Once | ORAL | Status: AC
  Administered 2018-01-18: 0.1 mg via ORAL
  Filled 2018-01-18: qty 1

## 2018-01-18 NOTE — ED Triage Notes (Signed)
Pt presents for evaluation of hypertension and shortness of breath that started today while at work. Pt also had "weird sensation to L arm." Pt recently seen for similar.
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
## 2018-01-18 NOTE — ED Provider Notes (Signed)
Patient signed out to me by Dr. Lita Mains and repeat troponin negative.  Patient stable for discharge.  Will follow with her doctor for her hypertension   Allison Leigh, MD 01/18/18 [phone]

## 2018-01-19 ENCOUNTER — Ambulatory Visit: Payer: Medicare Other | Admitting: Family Medicine

## 2018-01-19 DIAGNOSIS — I10 Essential (primary) hypertension: Secondary | ICD-10-CM

## 2018-01-19 NOTE — Patient Instructions (Signed)
It was great meeting you today! I am sorry that you had such an issue with your chest pain and blood pressure at the emergency department. I think your blood pressure was likely falsely elevated due to the pain and shortness of breath you were having. I would get a blood pressure cuff for home and check your pressure frequently. That is the best way to know how your blood pressure is doing over a long period of time.

## 2018-01-22 ENCOUNTER — Encounter: Payer: Self-pay | Admitting: Family Medicine

## 2018-01-22 NOTE — Progress Notes (Signed)
   HPI 47 year old who presents for follow up for blood pressure. Patient seen on 8/15 in the ed for chest pain and difficulty breathing. Workup was non-revealing and these problems did resolve on their own. Patient was found to have a high blood pressure during this visit (up to 180s/110s) and she was encouraged to follow up in clinic. Felt to likely be related to anxiety from her chest pain and sob.  CC: blood pressure follow up   ROS:  Review of Systems See HPI for ROS.   CC, SH/smoking status, and VS noted  Objective: LMP 04/26/2017  Gen: NAD, alert, cooperative, and pleasant. Well appearing AA female CV: RRR, no murmur Resp: CTAB, no wheezes, non-labored Neuro: Alert and oriented, Speech clear, No gross deficits   Assessment and plan:  Essential hypertension BP 125/80 in clinic. Her hypertension in the ED was likely due to anxiety and perhaps pain response from her condition at that time. recommended continuing lisinopril/hctz 20-25.  Follow up with pcp as needed   No orders of the defined types were placed in this encounter.   No orders of the defined types were placed in this encounter.    Guadalupe Dawn MD PGY-2 Family Medicine Resident  01/22/2018 11:57 AM

## 2018-01-22 NOTE — Assessment & Plan Note (Signed)
BP 125/80 in clinic. Her hypertension in the ED was likely due to anxiety and perhaps pain response from her condition at that time. recommended continuing lisinopril/hctz 20-25.  Follow up with pcp as needed

## 2018-01-26 ENCOUNTER — Encounter: Payer: Self-pay | Admitting: Family Medicine

## 2018-01-26 ENCOUNTER — Other Ambulatory Visit: Payer: Self-pay

## 2018-01-26 ENCOUNTER — Ambulatory Visit: Payer: Medicare Other | Admitting: Family Medicine

## 2018-01-26 VITALS — BP 142/80 | HR 77 | Temp 98.3°F | Ht 63.0 in | Wt 167.0 lb

## 2018-01-26 DIAGNOSIS — R7309 Other abnormal glucose: Secondary | ICD-10-CM

## 2018-01-26 DIAGNOSIS — F411 Generalized anxiety disorder: Secondary | ICD-10-CM

## 2018-01-26 MED ORDER — CITALOPRAM HYDROBROMIDE 10 MG PO TABS
10.0000 mg | ORAL_TABLET | Freq: Every day | ORAL | 3 refills | Status: DC
Start: 2018-01-26 — End: 2018-03-02

## 2018-01-26 NOTE — Progress Notes (Signed)
  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  Allison Steele reports a number of concerns today.  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  Her work days are stressful.  She has now twice been to the emergency department for palpitations tingling down her left arm and chest pain.  Her evaluation both times is been negative.  Her troponin and d-dimer testing were negative her metabolic panel was notable for mild hyperglycemia.  She was told this may be due to her hypertension but her blood pressure has been well controlled on her current agent.  She denies chest pain with exertion she is able to walk a significant distance without chest pain she has never been a smoker.  The patient believes this may be related to her anxiety she has stressors in her life including her sister, and work.  She is currently on Wellbutrin which does not help her symptoms. Her panic symptoms that she describes include palpitations left arm tingling and feeling overwhelmed she does not regularly see a therapist but is interested in doing so.  She does report long-standing low mood and depressive symptoms.  She reports she does not really look forward to days at work.  She endorses poor sleep.  She endorses symptoms of panic as described above.  She denies thoughts of hurting herself or others currently.   ROS:  ROS Denies headaches, chest pain, shortness of breath, lower extremity edema, she also denies polyuria and polydipsia. I have reviewed the patient's medical, surgical, family, and social history as appropriate.   Vitals:   01/26/18 1112  BP: (!) 142/80  Pulse: 77  Temp: 98.3 F (36.8 C)  SpO2:  100%   Filed Weights   01/26/18 1112  Weight: 167 lb (75.8 kg)  HEENT: Sclera anicteric. Dentition is moderate. Appears well hydrated. Neck: Supple Cardiac: Regular rate and rhythm. Normal S1/S2. No murmurs, rubs, or gallops appreciated. Lungs: Clear bilaterally to ascultation.  Extremities: Warm, well perfused without edema.  Skin: Warm, dry Psych: Pleasant and appropriate    Allison Steele was seen today for hypertension.  Diagnoses and all orders for this visit:  Elevated glucose level -     Hemoglobin A1c  Generalized anxiety disorder, the patient believes she is having panic symptoms and I agree this may be what is going on when she has these episodes.  Other etiologies include intermittent tachycardias including atrial fibrillation which I think is less likely and hyperthyroidism.  If this persists would consider cardiac monitoring with an event monitor.  Given that Wellbutrin is not particularly effective for anxiety we will start Celexa.  Could consider weaning off Wellbutrin in future.  Could also consider addition of BuSpar in the future.  She is interested in seeing Dr. Gwenlyn Saran and her associates which I provided the number for and she will follow-up with them.  We did discuss that Celexa will not work for several weeks -     citalopram (CELEXA) 10 MG tablet; Take 1 tablet (10 mg total) by mouth daily. -     TSH -     Amb ref to [place], MD  Family Medicine Teaching Service

## 2018-01-26 NOTE — Assessment & Plan Note (Signed)
Continue Wellbutrin therapy. begin Celexa therapy.  Referral placed to behavioral health

## 2018-01-26 NOTE — Assessment & Plan Note (Signed)
Currently on HCTZ and lisinopril combination.  her blood pressure is well controlled.

## 2018-01-26 NOTE — Patient Instructions (Signed)
  Call the Behavioral Medicine center to schedule an appointment. Their phone number is: [phone].  It was nice to meet you today  Please start Citalopram for your anxiety--once a day  I will see you back in a month---   [name], MD  Family Medicine

## 2018-01-27 LAB — HEMOGLOBIN A1C
Est. average glucose Bld gHb Est-mCnc: 108 mg/dL
HEMOGLOBIN A1C: 5.4 % (ref 4.8–5.6)

## 2018-01-27 LAB — TSH: TSH: 1.16 u[IU]/mL (ref 0.450–4.500)

## 2018-01-30 ENCOUNTER — Telehealth: Payer: Self-pay | Admitting: Family Medicine

## 2018-01-30 NOTE — Telephone Encounter (Signed)
Attempted to call patient to inform of results and there was no answer and no voice mail.  Allison Steele, [name]

## 2018-01-30 NOTE — Telephone Encounter (Signed)
Attempted to call patient. Recent results normal.   Nursing-- can you please call the patient:  1. Let her know her recent labs (thyroid testing) were normal.  2. Please help her schedule an appointment with [facility] if possible for her mood symptoms.  Let me know if she has questions.  Thanks! Maribell Demeo

## 2018-01-31 NOTE — Telephone Encounter (Signed)
Patient was informed that her thyroid test were normal and she said that she was going to schedule her own [place] appointment this week and did not need help.  Ozella Almond, [place]

## 2018-03-02 ENCOUNTER — Ambulatory Visit (INDEPENDENT_AMBULATORY_CARE_PROVIDER_SITE_OTHER): Payer: Medicare Other | Admitting: Family Medicine

## 2018-03-02 ENCOUNTER — Ambulatory Visit: Payer: Medicare Other | Admitting: Psychology

## 2018-03-02 ENCOUNTER — Encounter: Payer: Self-pay | Admitting: Family Medicine

## 2018-03-02 VITALS — BP 135/80 | HR 81 | Temp 98.5°F | Wt 167.2 lb

## 2018-03-02 DIAGNOSIS — F411 Generalized anxiety disorder: Secondary | ICD-10-CM

## 2018-03-02 DIAGNOSIS — H4722 Hereditary optic atrophy: Secondary | ICD-10-CM

## 2018-03-02 DIAGNOSIS — Z23 Encounter for immunization: Secondary | ICD-10-CM

## 2018-03-02 NOTE — Assessment & Plan Note (Signed)
She sees ophthalmology.  He is still able to work.  She is on Plaquenil.  Will refer back to rheumatology for this given her eye condition and associated risks of Plaquenil.

## 2018-03-02 NOTE — Patient Instructions (Signed)
It was wonderful to see you today.  Thank you for choosing [facility].   Please call [phone] with any questions about today's appointment.  Please be sure to schedule follow up at the front  desk before you leave today.   Carina Brown, MD  Family Medicine    

## 2018-03-02 NOTE — Progress Notes (Signed)
  Patient Name: Allison Steele Date of Birth: 1970/07/01 Date of Visit: 03/02/18 PCP: Martyn Malay, MD  Chief Complaint: medication follow up   Subjective: Allison Steele is a pleasant 47 y.o. year old woman with a history of sarcoidosis, Leber's hereditary optic neuropathy, hypertension presenting today for follow-up for medication changes and anxiety.  Allison Steele was recently seen for anxiety and panic symptoms.  She was transition from Wellbutrin to citalopram.  She reports she is stop citalopram.  She has had side effects from this.  She reports she had nightmares which are very vivid and disturbing to her.  She reports her anxiety continues.  She denies suicidal homicidal ideation.  She is a follow-up with behavioral health with [facility] later today.  She would like to let the medication get back that are different medication.  Mr. reports she is going to the [place].  She wonders what vaccine she needs.  She plans to be on a cruise ship most of this time.  ROS:  ROS Negative except for as above  I have reviewed the patient's medical, surgical, family, and social history as appropriate.   Vitals:   03/02/18 0844  BP: 135/80  Pulse: 81  Temp: 98.5 F (36.9 C)  SpO2: 99%   Filed Weights   03/02/18 0844  Weight: 167 lb 3.2 oz (75.8 kg)   HEENT: Sclera anicteric. Dentition is poor. Appears well hydrated. Neck: Supple Cardiac: Regular rate and rhythm. Normal S1/S2. No murmurs, rubs, or gallops appreciated. Lungs: Clear bilaterally to ascultation.    Allison Steele was seen today for medication problem.  Diagnoses and all orders for this visit:  Need for prophylactic vaccination and inoculation against viral hepatitis -     Hepatitis A vaccine adult IM -  We reviewed travel precautions including but not limited to avoiding fresh foods, contaminated water, local foods which appeared to be uncooked.  Recommended walking every 2 hours while in the plane and cruise ship.  I will  review antinausea medications with her at her upcoming appointment  Need for immunization against influenza -     Flu Vaccine QUAD 36+ mos IM  GAD, with overlying depression.  Discussed options at length.  Patient would like to stop citalopram.  She will continue Wellbutrin.  Will consider Prozac in the future or buspirone.  Could also consider referral to psychiatry.  At follow-up referral to rheumatology.  The patient has been on Plaquenil and has Leber's hereditary optic neuropathy this is an interesting choice given her existing eye conditions.  [name], MD  Family Medicine Teaching Service

## 2018-03-26 ENCOUNTER — Encounter: Payer: Self-pay | Admitting: Family Medicine

## 2018-03-26 ENCOUNTER — Ambulatory Visit: Payer: Medicare Other | Admitting: Family Medicine

## 2018-03-26 ENCOUNTER — Other Ambulatory Visit: Payer: Self-pay

## 2018-03-26 VITALS — BP 170/88 | HR 89 | Temp 98.7°F | Ht 63.0 in | Wt 169.4 lb

## 2018-03-26 DIAGNOSIS — Z862 Personal history of diseases of the blood and blood-forming organs and certain disorders involving the immune mechanism: Secondary | ICD-10-CM

## 2018-03-26 DIAGNOSIS — R030 Elevated blood-pressure reading, without diagnosis of hypertension: Secondary | ICD-10-CM

## 2018-03-26 DIAGNOSIS — F411 Generalized anxiety disorder: Secondary | ICD-10-CM | POA: Diagnosis not present

## 2018-03-26 NOTE — Patient Instructions (Addendum)
You will be called about a Pulmonary Appointment  I will call you in 2 weeks about your blood pressure- please check this at work 2-3 times per week   Have fun in the [place]! I will see you back in January.   It was wonderful to see you today.  Thank you for choosing [facility].   Please call [phone] with any questions about today's appointment.  Please be sure to schedule follow up at the front  desk before you leave today.   [name], MD  Family Medicine
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
## 2018-03-27 ENCOUNTER — Telehealth: Payer: Self-pay

## 2018-03-27 ENCOUNTER — Other Ambulatory Visit: Payer: Self-pay | Admitting: Family Medicine

## 2018-03-27 DIAGNOSIS — I10 Essential (primary) hypertension: Secondary | ICD-10-CM

## 2018-03-27 MED ORDER — AMLODIPINE BESYLATE 5 MG PO TABS: 5.0000 mg | ORAL_TABLET | Freq: Every day | ORAL | 3 refills | Status: DC

## 2018-03-27 NOTE — Telephone Encounter (Signed)
Patient called and stated her BP at 3 pm was 189/109. Has taken Lisinopril today.  Please call her at [phone]  Danley Danker, RN [facility] RN)

## 2018-03-27 NOTE — Telephone Encounter (Signed)
Called patient regarding elevated blood pressure.  Her blood pressure was also elevated at her previous visit.  She denies chest pain, shortness of breath, palpitations, headache, blurry vision.  This may be due to a change in her kidney function.  She has not been taking over-the-counter medications.  She denies NSAID use.  She denies BC powder use.  She denies illicit substance use.  We will check metabolic panel as well as start a low-dose of Norvasc.  All questions were answered reviewed reasons to call and return to care

## 2018-03-28 ENCOUNTER — Other Ambulatory Visit: Payer: Self-pay

## 2018-03-29 ENCOUNTER — Telehealth: Payer: Self-pay | Admitting: *Deleted

## 2018-03-29 ENCOUNTER — Other Ambulatory Visit: Payer: Self-pay

## 2018-03-29 NOTE — Telephone Encounter (Signed)
Called and informed patient that she could come in at any time for her blood work per Dr. Owens Shark.  Allison Steele, [place]

## 2018-03-29 NOTE — Telephone Encounter (Signed)
Pt did not start her new BP med until yesterday. Wants to know when she should come in for BMET.    Advised I thought it would probably need to be next week, but I would verify with provider. Elyse Prevo, Salome Spotted, CMA

## 2018-03-29 NOTE — Telephone Encounter (Signed)
Anyday is good--- it is Norvasc, so timing is not critical. Please let patient know.

## 2018-04-09 ENCOUNTER — Telehealth: Payer: Self-pay | Admitting: *Deleted

## 2018-04-09 NOTE — Telephone Encounter (Signed)
Pt called and wanted to know what to take for her cold symptoms since she has HTN.  Advised to pick up some coricidin.  Explained that this is made specifically for people with HTN.   Pt will go to [facility] now.  Millissa Deese, Salome Spotted, CMA

## 2018-04-10 NOTE — Telephone Encounter (Signed)
Agree with below documentation.  [name], MD  Family Medicine Teaching Service

## 2018-04-12 ENCOUNTER — Telehealth: Payer: Self-pay | Admitting: Family Medicine

## 2018-04-12 NOTE — Telephone Encounter (Signed)
Left HIPAA compliant voicemail. If patient calls, please find out most recent BP readings--recently started on Norvasc.

## 2018-05-15 ENCOUNTER — Ambulatory Visit (INDEPENDENT_AMBULATORY_CARE_PROVIDER_SITE_OTHER): Payer: Medicare Other | Admitting: Pulmonary Disease

## 2018-05-15 ENCOUNTER — Encounter: Payer: Self-pay | Admitting: Pulmonary Disease

## 2018-05-15 DIAGNOSIS — D869 Sarcoidosis, unspecified: Secondary | ICD-10-CM | POA: Diagnosis not present

## 2018-05-15 NOTE — Patient Instructions (Signed)
History of sarcoidosis  No respiratory complaints We will follow-up on your study results  I will see you back in the office in about 4-6 weeks  Your sarcoidosis may not have any respiratory component.

## 2018-05-15 NOTE — Progress Notes (Signed)
Allison Steele    [id number]    1970-11-18  Primary Care Physician:Brown, Raiford Simmonds, MD  Referring Physician: Martyn Malay, MD [place],  [id number]  Chief complaint:   Patient with a history of sarcoidosis  HPI:  Denies any respiratory complaints at present Denies a cough, no shortness of breath She is physically very active, exercises regularly Denies shortness of breath with activity  Sarcoidosis was diagnosed following biopsy of a skin lesion about 5 years ago Has been on Plaquenil with control of the skin lesions Denies any history of respiratory complaints No history of heart disease No history of kidney disease Skin lesions are well controlled at present  She has a history of lebers optic neuropathy, 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 amLODipine (NORVASC) 5 MG tablet Take 1 tablet (5 mg total) by mouth daily.  Marland Kitchen aspirin 500 MG EC tablet Take 500 mg by mouth once as needed for pain.  Marland Kitchen buPROPion (WELLBUTRIN SR) 150 MG 12 hr tablet Take 1 tablet (150 mg total) by mouth 2 (two) times daily. (Patient taking differently: Take 300 mg by mouth daily. )  . hydroxychloroquine (PLAQUENIL) 200 MG tablet TAKE 1 TABLET BY MOUTH TWICE A DAY (Patient taking differently: Take 400 mg by mouth daily. )  . lisinopril-hydrochlorothiazide (PRINZIDE,ZESTORETIC) 20-25 MG tablet Take 1 tablet by mouth daily.  . Melatonin 3 MG TABS Take 1 tablet (3 mg total) by mouth at bedtime. (Patient taking differently: Take 1 tablet by mouth at bedtime as needed (for sleep). )  . Multiple Vitamins-Minerals (ADULT ONE DAILY GUMMIES) CHEW Chew 1 tablet by mouth daily.  Marland Kitchen omeprazole (PRILOSEC) 20 MG capsule TAKE 1 CAPSULE (20 MG TOTAL) BY MOUTH DAILY.  Marland Kitchen polyethylene glycol powder (GLYCOLAX/MIRALAX) powder Take 17 g by mouth daily. (Patient taking differently: Take 17 g by mouth daily as needed for mild  constipation. )   No facility-administered encounter medications on file as of 05/15/2018.     Allergies as of 05/15/2018 - Review Complete 05/15/2018  Allergen Reaction Noted  . Citalopram hydrobromide Other (See Comments) 03/02/2018    Past Medical History:  Diagnosis Date  . Anemia    3 MONTHS AGO, STARTED ON IRON PILLS  . Depression   . GERD (gastroesophageal reflux disease)   . Headache   . Hypertension   . 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 Loss of central vision bilaterally.     . Legally blind    Leber`s Optic Neuropathy - Dx at age 47, resulting in blindness, followed at [facility]  . Nephrolithiasis   . Sarcoidosis     - followed by Dr. Delman Cheadle, [facility], [phone]    Past Surgical History:  Procedure Laterality Date  . CESAREAN SECTION    . ROBOTIC ASSISTED TOTAL HYSTERECTOMY WITH SALPINGECTOMY Bilateral 05/10/2017   Procedure: ROBOTIC ASSISTED TOTAL HYSTERECTOMY WITH SALPINGECTOMY;  Surgeon: Princess Bruins, MD;  Location: [facility];  Service: Gynecology;  Laterality: Bilateral;  laparoscopic  Request 7:30am OR start time in [place] Gyn block time  Requests 2 1/2 hours Dr. would like to use Fenestrated Bipolar (in cupboard Rm 7. Blue cord there also)    Family History  Problem Relation Age of Onset  . Cancer Mother   . Hypertension Mother   . Alcohol abuse Father  Social History   Socioeconomic History  . Marital status: Single    Spouse name: Not on file  . Number of children: Not on file  . Years of education: Not on file  . Highest education level: Not on file  Occupational History  . Not on file  Social Needs  . Financial resource strain: Not on file  . Food insecurity:    Worry: Not on file    Inability: Not on file  . Transportation needs:    Medical: Not on file    Non-medical: Not on file  Tobacco Use  . Smoking status: Never Smoker  . Smokeless  tobacco: Never Used  Substance and Sexual Activity  . Alcohol use: No  . Drug use: No  . Sexual activity: Not Currently  Lifestyle  . Physical activity:    Days per week: Not on file    Minutes per session: Not on file  . Stress: Not on file  Relationships  . Social connections:    Talks on phone: Not on file    Gets together: Not on file    Attends religious service: Not on file    Active member of club or organization: Not on file    Attends meetings of clubs or organizations: Not on file    Relationship status: Not on file  . Intimate partner violence:    Fear of current or ex partner: Not on file    Emotionally abused: Not on file    Physically abused: Not on file    Forced sexual activity: Not on file  Other Topics Concern  . Not on file  Social History Narrative  . Not on file    Review of Systems  Constitutional: Negative for activity change.  HENT: Negative.   Eyes: Positive for visual disturbance.  Respiratory: Negative.  Negative for apnea, cough, choking, chest tightness, shortness of breath, wheezing and stridor.   Cardiovascular: Negative for chest pain and palpitations.  Gastrointestinal: Negative.   Endocrine: Negative.   All other systems reviewed and are negative.   Vitals:   05/15/18 1332  BP: 124/72  Pulse: 82  SpO2: 95%     Physical Exam  Constitutional: She is oriented to person, place, and time. She appears well-developed and well-nourished.  HENT:  Head: Normocephalic and atraumatic.  Eyes: Right eye exhibits no discharge. Left eye exhibits no discharge. No scleral icterus.  Neck: Normal range of motion. Neck supple. No tracheal deviation present. No thyromegaly present.  Cardiovascular: Normal rate, regular rhythm and normal heart sounds.  Pulmonary/Chest: Effort normal. No respiratory distress. She has no wheezes. She has no rales. She exhibits no tenderness.  Abdominal: Soft. Bowel sounds are normal. She exhibits no distension. There  is no tenderness.  Musculoskeletal: Normal range of motion. She exhibits no edema or deformity.  Neurological: She is alert and oriented to person, place, and time. She has normal reflexes. No cranial nerve deficit.  Skin: Skin is warm and dry.  Psychiatric: She has a normal mood and affect. Her behavior is normal.   Chest x-ray 01/18/2018-reviewed by myself showing no acute infiltrate  Assessment:  Patient with a history of skin sarcoidosis  Here for evaluation for possible pulmonary sarcoidosis -Has no significant symptoms suggesting pulmonary sarcoidosis at present, recent chest x-ray-no evidence of adenopathy -No evidence of scarring on the chest x-ray -Has no shortness of breath with activity -She is very active with no significant limitations   Plan/Recommendations:  We will obtain a CT  scan with contrast to assess for adenopathy and also scarring We will obtain a full pulmonary function test to rule out evidence of restriction or obstructive disease  She requires follow-up for her kidneys as she had history of nephrolithiasis  I will follow-up with her in about 4 to 6 weeks   Sherrilyn Rist MD [facility] 05/15/2018, 1:52 PM  CC: Martyn Malay, MD

## 2018-05-17 ENCOUNTER — Encounter: Payer: Self-pay | Admitting: Family Medicine

## 2018-05-17 DIAGNOSIS — Z87442 Personal history of urinary calculi: Secondary | ICD-10-CM | POA: Insufficient documentation

## 2018-05-28 ENCOUNTER — Ambulatory Visit (INDEPENDENT_AMBULATORY_CARE_PROVIDER_SITE_OTHER)
Admission: RE | Admit: 2018-05-28 | Discharge: 2018-05-28 | Disposition: A | Discharge status: Home / Self Care | Payer: Medicare Other | Source: Ambulatory Visit | Attending: Pulmonary Disease | Admitting: Pulmonary Disease

## 2018-05-28 DIAGNOSIS — D869 Sarcoidosis, unspecified: Secondary | ICD-10-CM | POA: Diagnosis not present

## 2018-05-28 MED ORDER — IOPAMIDOL (ISOVUE-300) INJECTION 61%
80.0000 mL | Freq: Once | INTRAVENOUS | Status: AC | PRN
  Administered 2018-05-28: 80 mL via INTRAVENOUS

## 2018-06-03 ENCOUNTER — Encounter: Payer: Self-pay | Admitting: Family Medicine

## 2018-06-14 ENCOUNTER — Encounter: Payer: Self-pay | Admitting: Obstetrics & Gynecology

## 2018-06-14 ENCOUNTER — Ambulatory Visit: Payer: Medicare Other | Admitting: Obstetrics & Gynecology

## 2018-06-14 VITALS — BP 134/90 | Ht 63.0 in | Wt 166.0 lb

## 2018-06-14 DIAGNOSIS — E663 Overweight: Secondary | ICD-10-CM | POA: Diagnosis not present

## 2018-06-14 DIAGNOSIS — Z90710 Acquired absence of both cervix and uterus: Secondary | ICD-10-CM | POA: Diagnosis not present

## 2018-06-14 DIAGNOSIS — Z01419 Encounter for gynecological examination (general) (routine) without abnormal findings: Secondary | ICD-10-CM

## 2018-06-14 NOTE — Progress Notes (Signed)
Allison Steele 06-Nov-1970 [id number]   History:    48 y.o. G1P1L1 Single  RP:  Established patient presenting for annual gyn exam   HPI: S/P Robotic TLH/Bilateral Salpingectomy 05/10/2017.  No pelvic pain.  No Sx of menopause.  Abstinent currently.  Urine/BMs wnl.  Breasts wnl.  BMI 29.41.  Going to the gym regularly.  Low moods at times, no major depressive Sx, no suicidal ideations.  Health labs with Fam MD.  Past medical history,surgical history, family history and social history were all reviewed and documented in the EPIC chart.  Gynecologic History Patient's last menstrual period was 04/26/2017. Contraception: S/P Hysterectomy Last Pap: 2017. Results were: Normal per patient Last mammogram: Doesn't remember Bone Density: Never Colonoscopy: Never  Obstetric History OB History  Gravida Para Term Preterm AB Living  1 1       1   SAB TAB Ectopic Multiple Live Births               # Outcome Date GA Lbr Len/2nd Weight Sex Delivery Anes PTL Lv  1 Para              ROS: A ROS was performed and pertinent positives and negatives are included in the history.  GENERAL: No fevers or chills. HEENT: No change in vision, no earache, sore throat or sinus congestion. NECK: No pain or stiffness. CARDIOVASCULAR: No chest pain or pressure. No palpitations. PULMONARY: No shortness of breath, cough or wheeze. GASTROINTESTINAL: No abdominal pain, nausea, vomiting or diarrhea, melena or bright red blood per rectum. GENITOURINARY: No urinary frequency, urgency, hesitancy or dysuria. MUSCULOSKELETAL: No joint or muscle pain, no back pain, no recent trauma. DERMATOLOGIC: No rash, no itching, no lesions. ENDOCRINE: No polyuria, polydipsia, no heat or cold intolerance. No recent change in weight. HEMATOLOGICAL: No anemia or easy bruising or bleeding. NEUROLOGIC: No headache, seizures, numbness, tingling or weakness. PSYCHIATRIC: No depression, no loss of interest in normal activity or change in sleep  pattern.     Exam:   BP 134/90   Ht 5\' 3"  (1.6 m)   Wt 166 lb (75.3 kg)   LMP 04/26/2017   BMI 29.41 kg/m   Body mass index is 29.41 kg/m.  General appearance : Well developed well nourished female. No acute distress HEENT: Eyes: no retinal hemorrhage or exudates,  Neck supple, trachea midline, no carotid bruits, no thyroidmegaly Lungs: Clear to auscultation, no rhonchi or wheezes, or rib retractions  Heart: Regular rate and rhythm, no murmurs or gallops Breast:Examined in sitting and supine position were symmetrical in appearance, no palpable masses or tenderness,  no skin retraction, no nipple inversion, no nipple discharge, no skin discoloration, no axillary or supraclavicular lymphadenopathy Abdomen: no palpable masses or tenderness, no rebound or guarding Extremities: no edema or skin discoloration or tenderness  Pelvic: Vulva: Normal             Vagina: No gross lesions or discharge.  Pap reflex done  Cervix/Uterus absent  Adnexa  Without masses or tenderness  Anus: Normal   Assessment/Plan:  48 y.o. female for annual exam   1. Encounter for Papanicolaou smear of vagina as part of routine gynecological examination Gynecologic exam status post robotic total laparoscopic hysterectomy.  Pap reflex done on the vaginal vault today.  Breast exam normal.  Will schedule a screening mammogram now.  Health labs with family physician.  2. S/P total hysterectomy  3. Overweight (BMI 25.0-29.9) Body mass index of 29.41.  Recommend lower calories/carbs following  a diet such as [employer].  Recommend increase aerobic activities to 5 times a week and weightlifting every 2 days.  [name], 2:29 PM 06/14/2018

## 2018-06-15 ENCOUNTER — Other Ambulatory Visit: Payer: Self-pay | Admitting: Family Medicine

## 2018-06-15 DIAGNOSIS — K219 Gastro-esophageal reflux disease without esophagitis: Secondary | ICD-10-CM

## 2018-06-19 LAB — PAP IG W/ RFLX HPV ASCU

## 2018-06-24 ENCOUNTER — Encounter: Payer: Self-pay | Admitting: Obstetrics & Gynecology

## 2018-06-24 NOTE — Patient Instructions (Signed)
1. Encounter for Papanicolaou smear of vagina as part of routine gynecological examination Gynecologic exam status post robotic total laparoscopic hysterectomy.  Pap reflex done on the vaginal vault today.  Breast exam normal.  Will schedule a screening mammogram now.  Health labs with family physician.  2. S/P total hysterectomy  3. Overweight (BMI 25.0-29.9) Body mass index of 29.41.  Recommend lower calories/carbs following a diet such as [employer].  Recommend increase aerobic activities to 5 times a week and weightlifting every 2 days.  Allison Steele, leisure seeing you today!  I will inform you of your results as soon as they are available.

## 2018-06-26 ENCOUNTER — Telehealth: Payer: Self-pay | Admitting: *Deleted

## 2018-06-26 NOTE — Telephone Encounter (Signed)
Patient informed with normal pap smear on 06/14/18

## 2018-07-04 ENCOUNTER — Other Ambulatory Visit: Payer: Self-pay | Admitting: Obstetrics & Gynecology

## 2018-07-04 ENCOUNTER — Ambulatory Visit: Payer: Medicare Other | Admitting: Pulmonary Disease

## 2018-07-04 ENCOUNTER — Encounter: Payer: Self-pay | Admitting: Pulmonary Disease

## 2018-07-04 ENCOUNTER — Ambulatory Visit (INDEPENDENT_AMBULATORY_CARE_PROVIDER_SITE_OTHER): Payer: Medicare Other | Admitting: Pulmonary Disease

## 2018-07-04 VITALS — BP 128/88 | HR 88 | Ht 63.0 in | Wt 163.0 lb

## 2018-07-04 DIAGNOSIS — Z1231 Encounter for screening mammogram for malignant neoplasm of breast: Secondary | ICD-10-CM

## 2018-07-04 DIAGNOSIS — D869 Sarcoidosis, unspecified: Secondary | ICD-10-CM | POA: Diagnosis not present

## 2018-07-04 DIAGNOSIS — R9389 Abnormal findings on diagnostic imaging of other specified body structures: Secondary | ICD-10-CM | POA: Diagnosis not present

## 2018-07-04 LAB — PULMONARY FUNCTION TEST
FEF 25-75 POST: 3.89 L/s
FEF 25-75 Pre: 4.39 L/sec
FEF2575-%Change-Post: -11 %
FEF2575-%PRED-PRE: 173 %
FEF2575-%Pred-Post: 153 %
FEV1-%CHANGE-POST: -2 %
FEV1-%PRED-PRE: 109 %
FEV1-%Pred-Post: 106 %
FEV1-PRE: 2.55 L
FEV1-Post: 2.48 L
FEV1FVC-%Change-Post: -8 %
FEV1FVC-%PRED-PRE: 114 %
FEV6-%Change-Post: 6 %
FEV6-%Pred-Post: 102 %
FEV6-%Pred-Pre: 96 %
FEV6-POST: 2.87 L
FEV6-PRE: 2.7 L
FEV6FVC-%PRED-POST: 102 %
FEV6FVC-%Pred-Pre: 102 %
FVC-%Change-Post: 6 %
FVC-%PRED-POST: 100 %
FVC-%PRED-PRE: 94 %
FVC-POST: 2.87 L
FVC-PRE: 2.7 L
POST FEV6/FVC RATIO: 100 %
PRE FEV6/FVC RATIO: 100 %
Post FEV1/FVC ratio: 86 %
Pre FEV1/FVC ratio: 94 %
RV % pred: 85 %
RV: 1.41 L
TLC % pred: 87 %
TLC: 4.28 L

## 2018-07-04 NOTE — Progress Notes (Signed)
PFT completed today.Katie Welchel,CMA  

## 2018-07-04 NOTE — Progress Notes (Signed)
Allison Steele    [id number]    28-Dec-1970  Primary Care Physician:Brown, Raiford Simmonds, MD  Referring Physician: Martyn Malay, MD [place], [place] [address]  Chief complaint:   Patient with a history of sarcoidosis  HPI: Denies any significant symptoms today No cough, no shortness of breath with activity  Denies any respiratory complaints at present She is physically very active, exercises regularly Denies shortness of breath with activity  Sarcoidosis was diagnosed following biopsy of a skin lesion about 5 years ago Has been on Plaquenil with control of the skin lesions Denies any history of respiratory complaints No history of heart disease No history of kidney disease Skin lesions are well controlled at present  She has a history of lebers optic neuropathy, lost her vision at age 89    Outpatient Encounter Medications as of 07/04/2018  Medication Sig  . amLODipine (NORVASC) 5 MG tablet Take 1 tablet (5 mg total) by mouth daily.  Marland Kitchen aspirin 500 MG EC tablet Take 500 mg by mouth once as needed for pain.  Marland Kitchen buPROPion (WELLBUTRIN SR) 150 MG 12 hr tablet Take 1 tablet (150 mg total) by mouth 2 (two) times daily. (Patient taking differently: Take 300 mg by mouth daily. )  . hydroxychloroquine (PLAQUENIL) 200 MG tablet TAKE 1 TABLET BY MOUTH TWICE A DAY (Patient taking differently: Take 400 mg by mouth daily. )  . lisinopril-hydrochlorothiazide (PRINZIDE,ZESTORETIC) 20-25 MG tablet Take 1 tablet by mouth daily.  . Melatonin 3 MG TABS Take 1 tablet (3 mg total) by mouth at bedtime. (Patient taking differently: Take 1 tablet by mouth at bedtime as needed (for sleep). )  . Multiple Vitamins-Minerals (ADULT ONE DAILY GUMMIES) CHEW Chew 1 tablet by mouth daily.  Marland Kitchen omeprazole (PRILOSEC) 20 MG capsule TAKE 1 CAPSULE BY MOUTH EVERY DAY  . polyethylene glycol powder (GLYCOLAX/MIRALAX) powder Take 17 g by mouth daily. (Patient taking differently: Take 17 g by mouth  daily as needed for mild constipation. )   No facility-administered encounter medications on file as of 07/04/2018.     Allergies as of 07/04/2018 - Review Complete 06/24/2018  Allergen Reaction Noted  . Citalopram hydrobromide Other (See Comments) 03/02/2018    Past Medical History:  Diagnosis Date  . Anemia    3 MONTHS AGO, STARTED ON IRON PILLS  . Depression   . GERD (gastroesophageal reflux disease)   . Headache   . Hypertension   . 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 Loss of central vision bilaterally.     . Legally blind    Leber`s Optic Neuropathy - Dx at age 46, resulting in blindness, followed at [facility]  . Nephrolithiasis   . Sarcoidosis     - followed by Dr. Delman Cheadle, [facility], [phone]    Past Surgical History:  Procedure Laterality Date  . CESAREAN SECTION    . ROBOTIC ASSISTED TOTAL HYSTERECTOMY WITH SALPINGECTOMY Bilateral 05/10/2017   Procedure: ROBOTIC ASSISTED TOTAL HYSTERECTOMY WITH SALPINGECTOMY;  Surgeon: Princess Bruins, MD;  Location: [facility];  Service: Gynecology;  Laterality: Bilateral;  laparoscopic  Request 7:30am OR start time in [place] Gyn block time  Requests 2 1/2 hours Dr. would like to use Fenestrated Bipolar (in cupboard Rm 7. Blue cord there also)    Family History  Problem Relation Age of Onset  . Cancer Mother   . Hypertension Mother   . Alcohol  abuse Father     Social History   Socioeconomic History  . Marital status: Single    Spouse name: Not on file  . Number of children: Not on file  . Years of education: Not on file  . Highest education level: Not on file  Occupational History  . Not on file  Social Needs  . Financial resource strain: Not on file  . Food insecurity:    Worry: Not on file    Inability: Not on file  . Transportation needs:    Medical: Not on file    Non-medical: Not on file  Tobacco Use  . Smoking status:  Never Smoker  . Smokeless tobacco: Never Used  Substance and Sexual Activity  . Alcohol use: No  . Drug use: No  . Sexual activity: Not Currently  Lifestyle  . Physical activity:    Days per week: Not on file    Minutes per session: Not on file  . Stress: Not on file  Relationships  . Social connections:    Talks on phone: Not on file    Gets together: Not on file    Attends religious service: Not on file    Active member of club or organization: Not on file    Attends meetings of clubs or organizations: Not on file    Relationship status: Not on file  . Intimate partner violence:    Fear of current or ex partner: Not on file    Emotionally abused: Not on file    Physically abused: Not on file    Forced sexual activity: Not on file  Other Topics Concern  . Not on file  Social History Narrative  . Not on file    Review of Systems  Constitutional: Negative for activity change.  HENT: Negative.   Eyes: Positive for visual disturbance.  Respiratory: Negative.  Negative for apnea, cough, choking, chest tightness, shortness of breath, wheezing and stridor.   Cardiovascular: Negative for chest pain and palpitations.  Gastrointestinal: Negative.   All other systems reviewed and are negative.   Vitals:   07/04/18 1543  BP: 128/88  Pulse: 88  SpO2: 96%     Physical Exam  Constitutional: She appears well-developed and well-nourished.  HENT:  Head: Normocephalic and atraumatic.  Eyes: Right eye exhibits no discharge. Left eye exhibits no discharge.  Neck: Normal range of motion. Neck supple. No tracheal deviation present. No thyromegaly present.  Cardiovascular: Normal rate, regular rhythm and normal heart sounds.  Pulmonary/Chest: Effort normal. No respiratory distress. She has no wheezes. She has no rales. She exhibits no tenderness.  Abdominal: Soft. Bowel sounds are normal. She exhibits no distension. There is no abdominal tenderness.  Skin: Skin is warm and dry.    Psychiatric: She has a normal mood and affect. Her behavior is normal.   Chest x-ray 01/18/2018-reviewed by myself showing no acute infiltrate CT scan of the chest reviewed showing calcified adenopathy, small lung nodules-no evidence of scarring  PFT on 07/04/2018 reveals no obstruction, no significant bronchodilator response, normal lung volumes  Assessment:  Patient with a history of skin sarcoidosis   evaluation for possible pulmonary sarcoidosis -Has no significant symptoms suggesting pulmonary sarcoidosis at present, recent chest x-ray-no evidence of adenopathy, CT scan shows no significant adenopathy, no scarring -No evidence of scarring on the chest x-ray -Has no shortness of breath with activity -She is very active with no significant limitations   Plan/Recommendations:  No evidence of active sarcoidosis  Encouraged to  continue to stay active  She requires follow-up for her kidneys as she had history of nephrolithiasis  Tentatively, we will see her back in about 1 year   Sherrilyn Rist MD [facility] 07/04/2018, 3:57 PM  CC: Martyn Malay, MD

## 2018-07-04 NOTE — Patient Instructions (Signed)
Your breathing study was normal today Your CT scan showed calcified lymph nodes-there is no evidence of active sarcoidosis  There is no concern at present that you have active sarcoidosis affecting the lungs  I will see you back in the office tentatively in about a year  Call with any significant concerns

## 2018-07-06 ENCOUNTER — Ambulatory Visit (INDEPENDENT_AMBULATORY_CARE_PROVIDER_SITE_OTHER): Payer: Medicare Other | Admitting: Psychology

## 2018-07-06 DIAGNOSIS — F329 Major depressive disorder, single episode, unspecified: Secondary | ICD-10-CM

## 2018-07-30 ENCOUNTER — Other Ambulatory Visit: Payer: Self-pay | Admitting: *Deleted

## 2018-07-30 DIAGNOSIS — I10 Essential (primary) hypertension: Secondary | ICD-10-CM

## 2018-07-30 DIAGNOSIS — F32A Depression, unspecified: Secondary | ICD-10-CM

## 2018-07-30 DIAGNOSIS — F329 Major depressive disorder, single episode, unspecified: Secondary | ICD-10-CM

## 2018-07-30 MED ORDER — LISINOPRIL-HYDROCHLOROTHIAZIDE 20-25 MG PO TABS: 1.0000 | ORAL_TABLET | Freq: Every day | ORAL | 6 refills | Status: DC

## 2018-07-30 MED ORDER — BUPROPION HCL ER (SR) 150 MG PO TB12: 150.0000 mg | ORAL_TABLET | Freq: Two times a day (BID) | ORAL | 3 refills | Status: DC

## 2018-07-31 ENCOUNTER — Other Ambulatory Visit: Payer: Self-pay

## 2018-07-31 DIAGNOSIS — F32A Depression, unspecified: Secondary | ICD-10-CM

## 2018-07-31 DIAGNOSIS — F329 Major depressive disorder, single episode, unspecified: Secondary | ICD-10-CM

## 2018-07-31 MED ORDER — BUPROPION HCL ER (SR) 150 MG PO TB12: 150.0000 mg | ORAL_TABLET | Freq: Two times a day (BID) | ORAL | 3 refills | Status: DC

## 2018-08-02 ENCOUNTER — Ambulatory Visit
Admission: RE | Admit: 2018-08-02 | Discharge: 2018-08-02 | Disposition: A | Discharge status: Home / Self Care | Payer: Medicare Other | Source: Ambulatory Visit | Attending: Obstetrics & Gynecology | Admitting: Obstetrics & Gynecology

## 2018-08-02 ENCOUNTER — Ambulatory Visit: Payer: Self-pay

## 2018-08-02 DIAGNOSIS — Z1231 Encounter for screening mammogram for malignant neoplasm of breast: Secondary | ICD-10-CM

## 2018-08-06 ENCOUNTER — Other Ambulatory Visit: Payer: Self-pay | Admitting: *Deleted

## 2018-08-06 DIAGNOSIS — K219 Gastro-esophageal reflux disease without esophagitis: Secondary | ICD-10-CM

## 2018-08-06 MED ORDER — OMEPRAZOLE 20 MG PO CPDR: DELAYED_RELEASE_CAPSULE | ORAL | 3 refills | Status: DC

## 2018-08-08 ENCOUNTER — Ambulatory Visit (INDEPENDENT_AMBULATORY_CARE_PROVIDER_SITE_OTHER): Payer: Medicare Other | Admitting: Psychology

## 2018-08-08 DIAGNOSIS — F329 Major depressive disorder, single episode, unspecified: Secondary | ICD-10-CM

## 2018-10-17 ENCOUNTER — Other Ambulatory Visit: Payer: Self-pay

## 2018-10-17 DIAGNOSIS — K219 Gastro-esophageal reflux disease without esophagitis: Secondary | ICD-10-CM

## 2018-10-17 MED ORDER — OMEPRAZOLE 20 MG PO CPDR: DELAYED_RELEASE_CAPSULE | ORAL | 3 refills | Status: DC

## 2018-10-19 ENCOUNTER — Encounter (HOSPITAL_COMMUNITY): Payer: Self-pay

## 2018-10-19 ENCOUNTER — Ambulatory Visit (HOSPITAL_COMMUNITY)
Admission: EM | Admit: 2018-10-19 | Discharge: 2018-10-19 | Disposition: A | Discharge status: Home / Self Care | Payer: Medicare Other | Attending: Family Medicine | Admitting: Family Medicine

## 2018-10-19 ENCOUNTER — Ambulatory Visit
Admission: EM | Admit: 2018-10-19 | Discharge: 2018-10-19 | Disposition: A | Discharge status: Home / Self Care | Payer: Medicare Other

## 2018-10-19 ENCOUNTER — Other Ambulatory Visit: Payer: Self-pay

## 2018-10-19 DIAGNOSIS — K219 Gastro-esophageal reflux disease without esophagitis: Secondary | ICD-10-CM

## 2018-10-19 DIAGNOSIS — L739 Follicular disorder, unspecified: Secondary | ICD-10-CM

## 2018-10-19 DIAGNOSIS — L2489 Irritant contact dermatitis due to other agents: Secondary | ICD-10-CM | POA: Diagnosis not present

## 2018-10-19 MED ORDER — OMEPRAZOLE 20 MG PO CPDR: DELAYED_RELEASE_CAPSULE | ORAL | 3 refills | Status: DC

## 2018-10-19 NOTE — ED Triage Notes (Signed)
Pt c/o rash to top of bilateral hands for the past week and also a rash to vaginal area

## 2018-10-19 NOTE — ED Provider Notes (Signed)
[place]    CSN: [id number] Arrival date & time: 10/19/18  1638     History   Chief Complaint Chief Complaint  Patient presents with  . Rash    HPI Allison Steele is a 48 y.o. female.   Allison Steele presents with complaints of bilateral dorsal hands with rash and "burning" with washing of her hands. This has been ongoing for at least a week now. 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 States they also burn when washing dishes at home. Has tried using gold bond for dry skin which hasn't helped. Denies any previous similar. No drainage or skin breakdown. Also complaints of "bump" to vulva she noticed over the past week or so. States she had intercourse 1 month ago with a new partner, she did use condoms. She is uncertain if it broke however. No pain, itching, drainage or other complaints, just noted the bumps. She used Carlton Adam hair removal to her pubic hair. No other vaginal complaints- no discharge, itching burning. Denies concern for STD. No urinary complaints. No pelvic pain. Hx of hysterectomy.     ROS per HPI, negative if not otherwise mentioned.      Past Medical History:  Diagnosis Date  . Anemia    3 MONTHS AGO, STARTED ON IRON PILLS  . Depression   . GERD (gastroesophageal reflux disease)   . Headache   . Hypertension   . 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 Loss of central vision bilaterally.     . Legally blind    Leber`s Optic Neuropathy - Dx at age 67, resulting in blindness, followed at [facility]  . Nephrolithiasis   . Sarcoidosis     - followed by Dr. Delman Cheadle, [facility], [phone]    Patient Active Problem List   Diagnosis Date Noted  . History of nephrolithiasis 05/17/2018  . Constipation 10/23/2017  . Anemia 01/09/2017  . Urinary frequency 11/17/2016  . Insomnia 09/18/2016  . Burn of foot, right, second  degree 05/06/2015  . Esophageal reflux 05/18/2014  . Numbness and tingling in right hand 02/25/2014  . Overweight(278.02) 03/17/2010  . DEPRESSION/ANXIETY 05/13/2009  . 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 Essential hypertension 08/03/2006    Past Surgical History:  Procedure Laterality Date  . CESAREAN SECTION    . ROBOTIC ASSISTED TOTAL HYSTERECTOMY WITH SALPINGECTOMY Bilateral 05/10/2017   Procedure: ROBOTIC ASSISTED TOTAL HYSTERECTOMY WITH SALPINGECTOMY;  Surgeon: Princess Bruins, MD;  Location: [facility];  Service: Gynecology;  Laterality: Bilateral;  laparoscopic  Request 7:30am OR start time in [place] [facility] block time  Requests 2 1/2 hours Dr. would like to use Fenestrated Bipolar (in cupboard Rm 7. Blue cord there also)    OB History    Gravida  1   Para  1   Term      Preterm      AB      Living  1     SAB      TAB      Ectopic      Multiple      Live Births               Home Medications    Prior to Admission medications   Medication Sig Start Date End Date Taking? Authorizing Provider  amLODipine (NORVASC) 5 MG tablet Take 1 tablet (5 mg total) by  mouth daily. 03/27/18   Martyn Malay, MD  aspirin 500 MG EC tablet Take 500 mg by mouth once as needed for pain.    [provider]  buPROPion (WELLBUTRIN SR) 150 MG 12 hr tablet Take 1 tablet (150 mg total) by mouth 2 (two) times daily. 07/31/18   Martyn Malay, MD  hydroxychloroquine (PLAQUENIL) 200 MG tablet TAKE 1 TABLET BY MOUTH TWICE A DAY Patient taking differently: Take 400 mg by mouth daily.  05/22/17   Carlyle Dolly, MD  lisinopril-hydrochlorothiazide (PRINZIDE,ZESTORETIC) 20-25 MG tablet Take 1 tablet by mouth daily. 07/30/18   Martyn Malay, MD  Melatonin 3 MG TABS Take 1 tablet (3 mg total) by mouth at bedtime. Patient taking differently: Take 1 tablet by mouth at bedtime as needed (for sleep).  10/23/17   Carlyle Dolly, MD  Multiple Vitamins-Minerals (ADULT ONE DAILY GUMMIES) CHEW Chew 1 tablet by mouth daily.    [provider]  omeprazole (PRILOSEC) 20 MG capsule TAKE 1 CAPSULE BY MOUTH EVERY DAY 10/17/18   Martyn Malay, MD  polyethylene glycol powder (GLYCOLAX/MIRALAX) powder Take 17 g by mouth daily. Patient taking differently: Take 17 g by mouth daily as needed for mild constipation.  10/23/17   Carlyle Dolly, MD    Family History Family History  Problem Relation Age of Onset  . Cancer Mother   . Hypertension Mother   . Alcohol abuse Father   . Breast cancer Neg Hx     Social History Social History   Tobacco Use  . Smoking status: Never Smoker  . Smokeless tobacco: Never Used  Substance Use Topics  . Alcohol use: No  . Drug use: No     Allergies   Citalopram hydrobromide   Review of Systems Review of Systems   Physical Exam Triage Vital Signs ED Triage Vitals  Enc Vitals Group     BP 10/19/18 0821 100/70     Pulse Rate 10/19/18 0821 (!) 109     Resp 10/19/18 0821 18     Temp 10/19/18 0821 98.6 F (37 C)     Temp Source 10/19/18 0821 Oral     SpO2 10/19/18 0821 100 %     Weight --      Height --      Head Circumference --      Peak Flow --      Pain Score 10/19/18 0822 0     Pain Loc --      Pain Edu? --      Excl. in [place]? --    No data found.  Updated Vital Signs BP 100/70 (BP Location: Left Arm)   Pulse (!) 109   Temp 98.6 F (37 C) (Oral)   Resp 18   LMP 04/26/2017   SpO2 100%   Visual Acuity Right Eye Distance:   Left Eye Distance:   Bilateral Distance:    Right Eye Near:   Left Eye Near:    Bilateral Near:     Physical Exam Constitutional:      General: She is not in acute distress.    Appearance: She is well-developed.  Cardiovascular:     Rate and Rhythm: Normal rate and regular rhythm.     Heart sounds: Normal heart sounds.  Pulmonary:     Effort: Pulmonary effort is normal.     Breath sounds: Normal breath  sounds.  Genitourinary:    Comments: 2-3 skin toned raised areas at follicles of  pubic hair; no redness, non tender; no vesicles, no drainage or fluctuance; no obvious external vaginal discharge.  Skin:    General: Skin is warm and dry.     Comments: Bilateral dorsal hands with dryness around MCP joints  Neurological:     Mental Status: She is alert and oriented to person, place, and time.      UC Treatments / Results  Labs (all labs ordered are listed, but only abnormal results are displayed) Labs Reviewed - No data to display  EKG None  Radiology No results found.  Procedures Procedures (including critical care time)  Medications Ordered in UC Medications - No data to display  Initial Impression / Assessment and Plan / UC Course  I have reviewed the triage vital signs and the nursing notes.  Pertinent labs & imaging results that were available during my care of the patient were reviewed by me and considered in my medical decision making (see chart for details).     Skin care discussed for skin dryness related to excess washing. Vulvar skin care discussed. No indication of abscess presence or herpes lesions. Return precautions provided. Patient verbalized understanding and agreeable to plan.   Final Clinical Impressions(s) / UC Diagnoses   Final diagnoses:  Irritant contact dermatitis due to other agents  Folliculitis     Discharge Instructions     Aquaphor emoliant to hands before bed and after bathing to help lock in moisture.  I personally like Eucerin hand cream to combat dryness, keep with you to use after handwashing as frequent handwashing can cause excess dryness.  Use of gloves while dishwashing to prevent further dryness.  Warm compresses to vulvar region or soaks in warm water (no soap), to help this resolve. Resolution should occur in the next 1-2 weeks.  If develop increased pain, drainage, fevers or otherwise worsening please return.    ED  Prescriptions    None     Controlled Substance Prescriptions [place] Controlled Substance Registry consulted? Not Applicable   Zigmund Gottron, NP 10/19/18 [phone]

## 2018-10-19 NOTE — Discharge Instructions (Signed)
Aquaphor emoliant to hands before bed and after bathing to help lock in moisture.  I personally like Eucerin hand cream to combat dryness, keep with you to use after handwashing as frequent handwashing can cause excess dryness.  Use of gloves while dishwashing to prevent further dryness.  Warm compresses to vulvar region or soaks in warm water (no soap), to help this resolve. Resolution should occur in the next 1-2 weeks.  If develop increased pain, drainage, fevers or otherwise worsening please return.

## 2018-10-22 ENCOUNTER — Telehealth (INDEPENDENT_AMBULATORY_CARE_PROVIDER_SITE_OTHER): Payer: Medicare Other | Admitting: Family Medicine

## 2018-10-22 ENCOUNTER — Other Ambulatory Visit: Payer: Self-pay

## 2018-10-22 ENCOUNTER — Encounter: Payer: Self-pay | Admitting: Family Medicine

## 2018-10-22 DIAGNOSIS — N9089 Other specified noninflammatory disorders of vulva and perineum: Secondary | ICD-10-CM

## 2018-10-22 DIAGNOSIS — I10 Essential (primary) hypertension: Secondary | ICD-10-CM

## 2018-10-22 DIAGNOSIS — D863 Sarcoidosis of skin: Secondary | ICD-10-CM

## 2018-10-22 NOTE — Progress Notes (Signed)
[facility] Telemedicine Visit  Patient consented to have virtual visit. Method of visit: Telephone the patient declined a video visit due to her vision impairment  Encounter participants: Patient: Allison Steele - located at home Provider: Martyn Malay - located at family medicine center Others (if applicable): No other individuals are present  Chief Complaint: Blood pressure follow-up  HPI:  Allison Steele is a pleasant 48 year old woman with history of cutaneous manifestations of sarcoidosis, Leber's hereditary optic neuropathy, anxiety, hypertension and acid reflux presenting today via telephone visit for follow-up.  The patient has limited ability to use technology due to her vision difficulties and she declined a video visit.  I discussed the limitations of an audio only visit.  The patient was started on amlodipine 5 mg at her last visit.  She has been taking this daily.  She reports she intermittently monitors her blood pressure.  Her blood pressures range between 725-3 60 systolic and 66Y to 40H diastolic.  She is not 474% certain of these values that she has not written them down and away she is able to see them.  She denies chest pain, shortness of breath, headaches or difficulty affording or taking medications.  She is also taking combination lisinopril HCTZ.  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  She has follow-up with them in 1 year.  The patient reports he has been a bit more anxious than usual.  She denies thoughts of hurting herself or others.  She reports she has good support through her family.  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   ROS: per HPI  Pertinent PMHx:  Sarcoidosis, under good control followed by pulmonary and ophthalmology Levers hereditary optic neuropathy, followed by ophthalmology Essential  hypertension, it is on certain if this is controlled at this time.  Based on her current values it is not at the desired level.  Exam:  Respiratory: Speaking comfortably in full sentences  Assessment/Plan:  Diagnoses and all orders for this visit:  Essential hypertension blood pressure is not at goal by her values at home.  Recommended she monitor her blood pressure over the next week and record the values in a book.  I will follow-up with her next Monday to check in on the values.  If these remain elevated we will increase her Norvasc.  She would benefit from tight control given her eye disease as well as her young age she is due for metabolic panel in August  Cutaneous sarcoidosis  Vulvar irritation patient recently seen in urgent care for this.  She reports the lesions are slowly healing but not 100% resolved.  She will call to schedule appointment in 2 weeks time if the lesions are not improved.  She was told these were due to irritation from shaving.  Time spent during visit with patient: 7 minutes   [name], MD  [facility] Medicine Teaching Service

## 2018-10-28 IMAGING — CR DG CHEST 2V
2 series · 2 of 2 positions shown · non-contrast
Comparison: August 21, 2012

CLINICAL DATA: Shortness of breath and chest pain

EXAM:
CHEST - 2 VIEW

[chest pa]
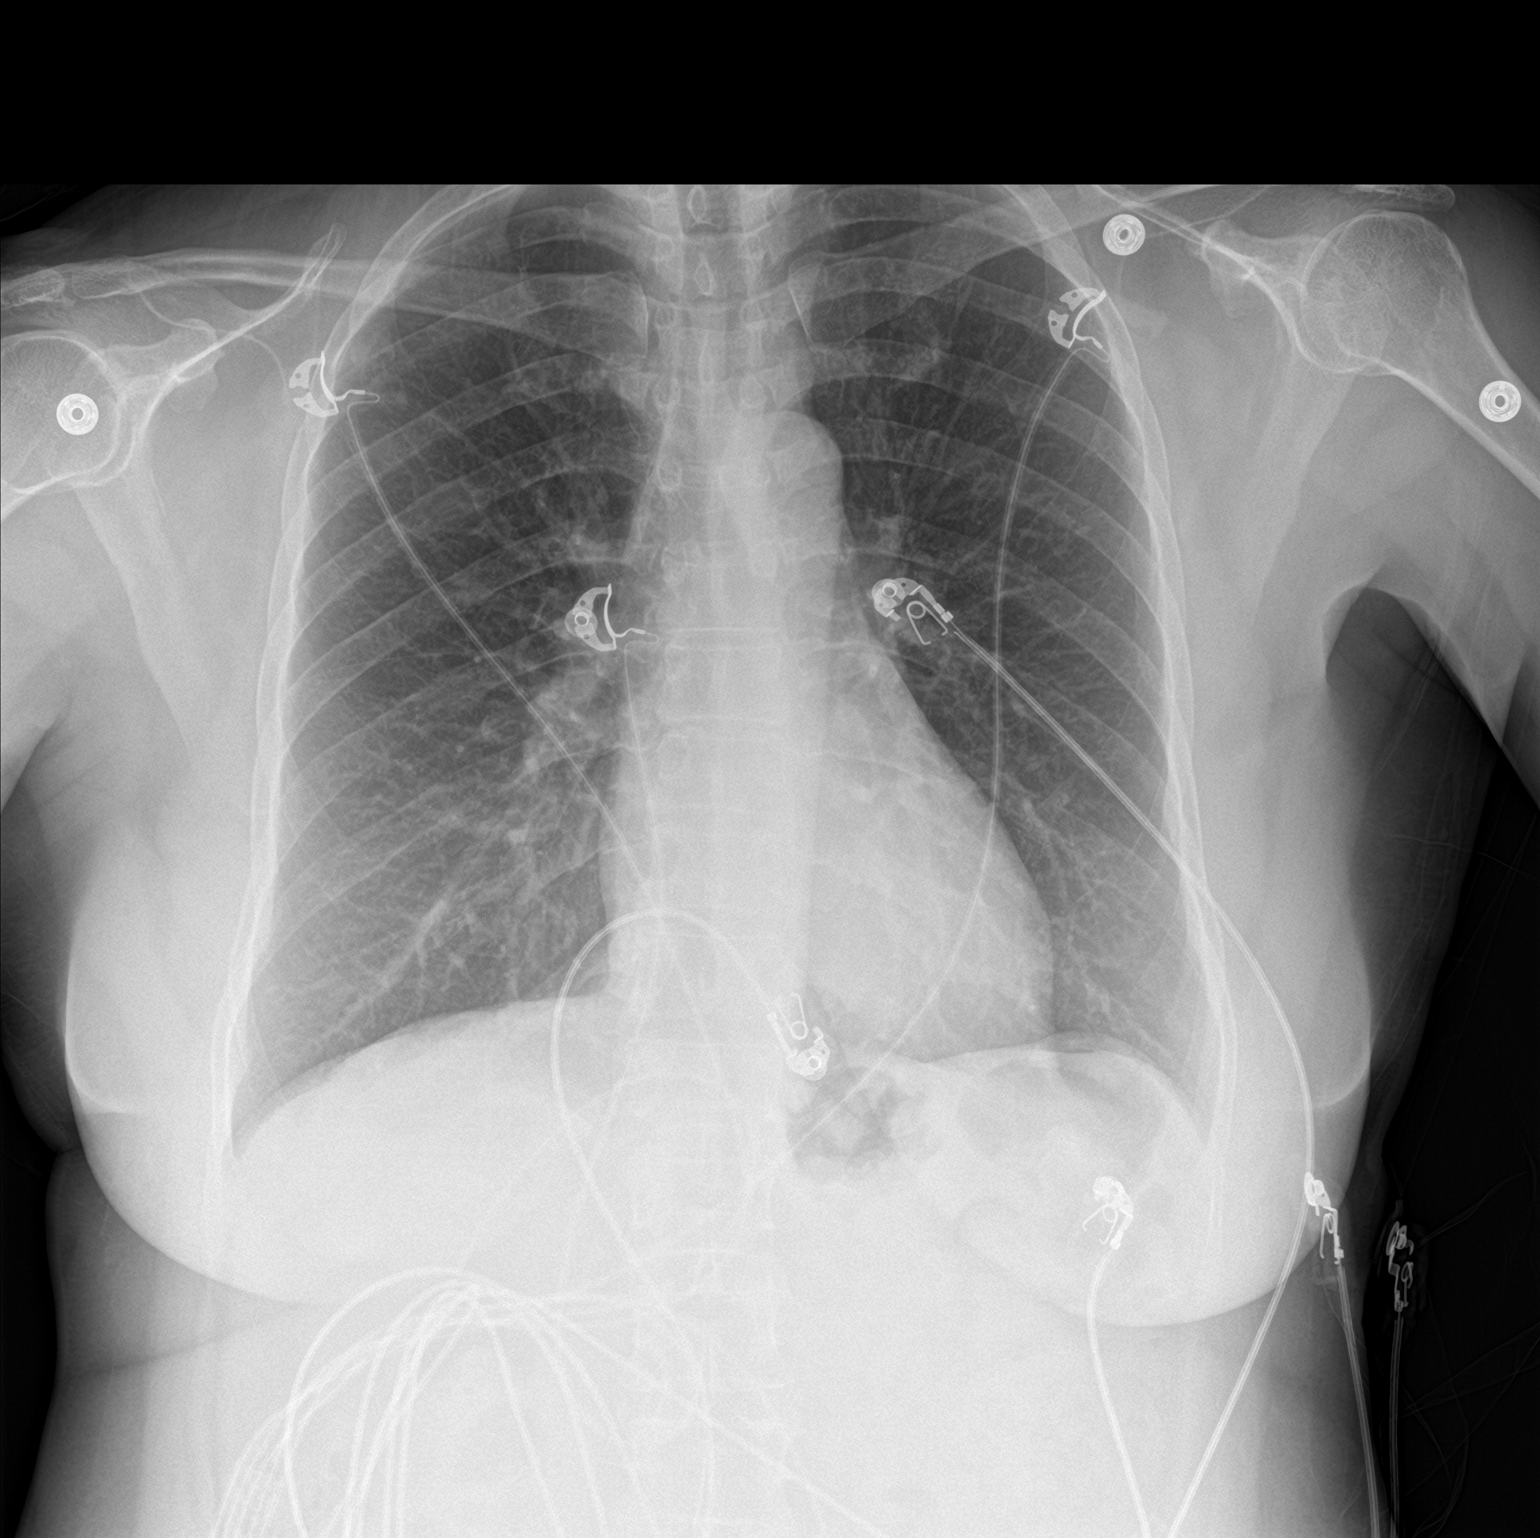

[chest lat]
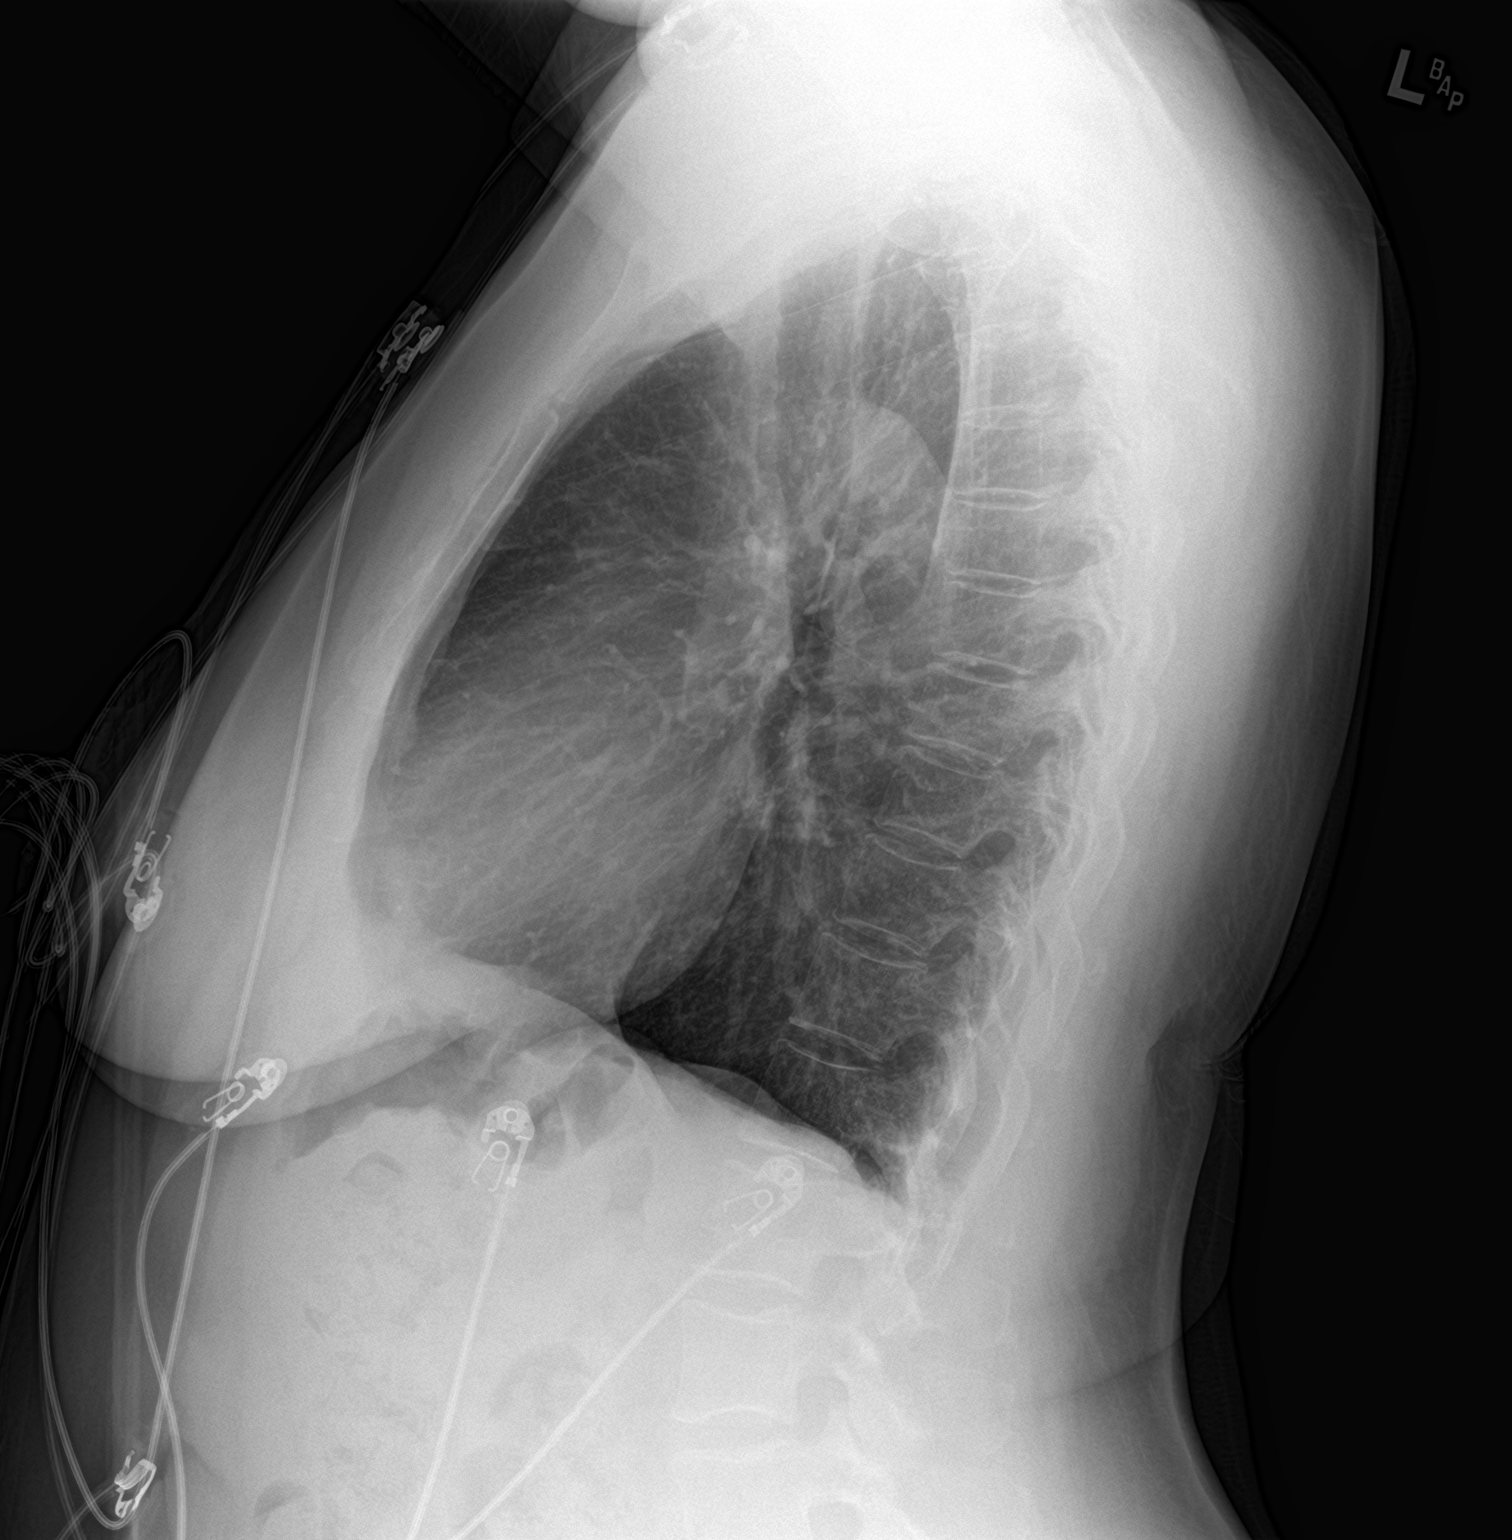

[2 of 2 positions shown; findings below may reference images not displayed]

FINDINGS: There is no edema or consolidation. The heart size and pulmonary
vascularity are normal. No adenopathy. No pneumothorax. No bone
lesions.
IMPRESSION: No edema or consolidation.

## 2018-10-29 NOTE — Progress Notes (Signed)
Called patient to check in on BP readings. BP readings over last week Wednesday 142/92 Thursday 135/87 Friday 141/86 Saturday 131/84  No changes to BP medications. She does report vulvar lesions are still present---will have office staff reach out to schedule appointment.   [name], MD  Family Medicine Teaching Service

## 2018-11-04 ENCOUNTER — Other Ambulatory Visit: Payer: Self-pay

## 2018-11-04 ENCOUNTER — Ambulatory Visit (HOSPITAL_COMMUNITY)
Admission: EM | Admit: 2018-11-04 | Discharge: 2018-11-04 | Disposition: A | Discharge status: Home / Self Care | Payer: Medicare Other | Attending: Family Medicine | Admitting: Family Medicine

## 2018-11-04 ENCOUNTER — Encounter (HOSPITAL_COMMUNITY): Payer: Self-pay | Admitting: Family Medicine

## 2018-11-04 DIAGNOSIS — L739 Follicular disorder, unspecified: Secondary | ICD-10-CM | POA: Diagnosis not present

## 2018-11-04 MED ORDER — DOXYCYCLINE HYCLATE 100 MG PO TABS: 100.0000 mg | ORAL_TABLET | Freq: Two times a day (BID) | ORAL | 0 refills | Status: DC

## 2018-11-04 NOTE — ED Provider Notes (Signed)
[facility]    CSN: [id number] Arrival date & time: 11/04/18  1004     History   Chief Complaint Chief Complaint  Patient presents with  . Follow-up  . Vaginal Itching    HPI Allison Steele is a 48 y.o. female.   48 yo woman here for follow up of 5/15 visit.  Hands are much better since she bought the moisturizer and stopped the clorox on the hands.  The vulvar bumps have not changed, however, over the past three weeks.  S/P hysterectomy 2 years ago   Note from 10/19/18: Lennie Muckle presents with complaints of bilateral dorsal hands with rash and "burning" with washing of her hands. This has been ongoing for at least a week now. 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 States they also burn when washing dishes at home. Has tried using gold bond for dry skin which hasn't helped. Denies any previous similar. No drainage or skin breakdown.   Also complaints of "bump" to vulva she noticed over the past week or so. States she had intercourse 1 month ago with a new partner, she did use condoms. She is uncertain if it broke however. No pain, itching, drainage or other complaints, just noted the bumps. She used Carlton Adam hair removal to her pubic hair. No other vaginal complaints- no discharge, itching burning. Denies concern for STD. No urinary complaints. No pelvic pain. Hx of hysterectomy.   Rx:  Aquaphor.  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     Past Medical History:  Diagnosis Date  . Anemia    3 MONTHS AGO, STARTED ON IRON PILLS  . Depression   . GERD (gastroesophageal reflux disease)   . Headache   . Hypertension   . 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 Loss of central vision bilaterally.     . Legally blind    Leber`s Optic Neuropathy - Dx at age 76, resulting in blindness, followed at [facility]  . Nephrolithiasis   .  Sarcoidosis     - followed by Dr. Delman Cheadle, [facility], [phone]    Patient Active Problem List   Diagnosis Date Noted  . History of nephrolithiasis 05/17/2018  . Constipation 10/23/2017  . Anemia 01/09/2017  . Urinary frequency 11/17/2016  . Insomnia 09/18/2016  . Burn of foot, right, second degree 05/06/2015  . Esophageal reflux 05/18/2014  . Numbness and tingling in right hand 02/25/2014  . Overweight(278.02) 03/17/2010  . DEPRESSION/ANXIETY 05/13/2009  . 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 Essential hypertension 08/03/2006    Past Surgical History:  Procedure Laterality Date  . CESAREAN SECTION    . ROBOTIC ASSISTED TOTAL HYSTERECTOMY WITH SALPINGECTOMY Bilateral 05/10/2017   Procedure: ROBOTIC ASSISTED TOTAL HYSTERECTOMY WITH SALPINGECTOMY;  Surgeon: Princess Bruins, MD;  Location: [facility];  Service: Gynecology;  Laterality: Bilateral;  laparoscopic  Request 7:30am OR start time in [place] [facility] block time  Requests 2 1/2 hours Dr. would like to use Fenestrated Bipolar (in cupboard Rm 7. Blue cord there also)    OB History    Gravida  1   Para  1   Term      Preterm      AB      Living  1     SAB      TAB      Ectopic  Multiple      Live Births               Home Medications    Prior to Admission medications   Medication Sig Start Date End Date Taking? Authorizing Provider  amLODipine (NORVASC) 5 MG tablet Take 1 tablet (5 mg total) by mouth daily. 03/27/18   Martyn Malay, MD  aspirin 500 MG EC tablet Take 500 mg by mouth once as needed for pain.    [provider]  buPROPion (WELLBUTRIN SR) 150 MG 12 hr tablet Take 1 tablet (150 mg total) by mouth 2 (two) times daily. 07/31/18   Martyn Malay, MD  doxycycline (VIBRA-TABS) 100 MG tablet Take 1 tablet (100 mg total) by mouth 2 (two) times daily. 11/04/18   Robyn Haber, MD  hydroxychloroquine (PLAQUENIL) 200  MG tablet TAKE 1 TABLET BY MOUTH TWICE A DAY Patient taking differently: Take 400 mg by mouth daily.  05/22/17   Carlyle Dolly, MD  lisinopril-hydrochlorothiazide (PRINZIDE,ZESTORETIC) 20-25 MG tablet Take 1 tablet by mouth daily. 07/30/18   Martyn Malay, MD  Melatonin 3 MG TABS Take 1 tablet (3 mg total) by mouth at bedtime. Patient taking differently: Take 1 tablet by mouth at bedtime as needed (for sleep).  10/23/17   Carlyle Dolly, MD  Multiple Vitamins-Minerals (ADULT ONE DAILY GUMMIES) CHEW Chew 1 tablet by mouth daily.    [provider]  omeprazole (PRILOSEC) 20 MG capsule TAKE 1 CAPSULE BY MOUTH EVERY DAY 10/19/18   Martyn Malay, MD  polyethylene glycol powder (GLYCOLAX/MIRALAX) powder Take 17 g by mouth daily. Patient taking differently: Take 17 g by mouth daily as needed for mild constipation.  10/23/17   Carlyle Dolly, MD    Family History Family History  Problem Relation Age of Onset  . Cancer Mother   . Hypertension Mother   . Alcohol abuse Father   . Breast cancer Neg Hx     Social History Social History   Tobacco Use  . Smoking status: Never Smoker  . Smokeless tobacco: Never Used  Substance Use Topics  . Alcohol use: No  . Drug use: No     Allergies   Citalopram hydrobromide   Review of Systems Review of Systems   Physical Exam Triage Vital Signs ED Triage Vitals  Enc Vitals Group     BP      Pulse      Resp      Temp      Temp src      SpO2      Weight      Height      Head Circumference      Peak Flow      Pain Score      Pain Loc      Pain Edu?      Excl. in [place]?    No data found.  Updated Vital Signs BP (!) 155/111 (BP Location: Right Arm)   Pulse 84   Resp 16   LMP 04/26/2017   SpO2 100%    Physical Exam Vitals signs and nursing note reviewed.  Constitutional:      Appearance: Normal appearance.  Eyes:     Conjunctiva/sclera: Conjunctivae normal.  Neck:     Musculoskeletal: Normal range of  motion and neck supple.  Pulmonary:     Effort: Pulmonary effort is normal.  Musculoskeletal: Normal range of motion.  Skin:    Comments: Note from 5/15: Genitourinary:  Comments: 2-3 skin toned raised areas at follicles of pubic hair; no redness, non tender; no vesicles, no drainage or fluctuance; no obvious external vaginal discharge.  Skin:    General: Skin is warm and dry.     Comments: Bilateral dorsal hands with dryness around MCP joints   Today's exam: Three 2 mm bumps left labia with faint white head.  Neurological:     General: No focal deficit present.     Mental Status: She is alert.  Psychiatric:        Mood and Affect: Mood normal.        Behavior: Behavior normal.        Thought Content: Thought content normal.      UC Treatments / Results  Labs (all labs ordered are listed, but only abnormal results are displayed) Labs Reviewed - No data to display  EKG None  Radiology No results found.  Procedures Procedures (including critical care time)  Medications Ordered in UC Medications - No data to display  Initial Impression / Assessment and Plan / UC Course  I have reviewed the triage vital signs and the nursing notes.  Pertinent labs & imaging results that were available during my care of the patient were reviewed by me and considered in my medical decision making (see chart for details).    Final Clinical Impressions(s) / UC Diagnoses   Final diagnoses:  Folliculitis     Discharge Instructions     This appears to be a very mild folliculitis.      ED Prescriptions    Medication Sig Dispense Auth. Provider   doxycycline (VIBRA-TABS) 100 MG tablet Take 1 tablet (100 mg total) by mouth 2 (two) times daily. 14 tablet Robyn Haber, MD     Controlled Substance Prescriptions [place] Controlled Substance Registry consulted? Not Applicable   Robyn Haber, MD 11/04/18 1036

## 2018-11-04 NOTE — ED Triage Notes (Signed)
Per pt she was here and was told it was hair bumps and to quit shaving and use a cream. Pt says the bumps are still there and are itchy and irritated. Using monistat but not working

## 2018-11-04 NOTE — Discharge Instructions (Addendum)
This appears to be a very mild folliculitis.

## 2018-11-06 ENCOUNTER — Telehealth: Payer: Self-pay | Admitting: *Deleted

## 2018-11-06 NOTE — Telephone Encounter (Signed)
Please schedule for virtual or office visit to discuss-- I think I have openings this weeks.   [name], MD  Family Medicine Teaching Service

## 2018-11-06 NOTE — Telephone Encounter (Signed)
Pt states that her job as required her to "stand all day yesterday and again today".  She is requesting a letter stating that she cant stand all day.  Will forward to MD.  Pt forgot her cell @ home today, but if Dr. Owens Shark needs to speak with her she can call [phone] Ext: [phone]  To MD. Christen Bame, CMA

## 2018-11-06 NOTE — Telephone Encounter (Signed)
Called and left message for patient.  Tried calling number at work but it just rings.  Informed patient to call to schedule an appointment either virtual or office visit.  Ozella Almond, [place]

## 2018-11-07 ENCOUNTER — Other Ambulatory Visit: Payer: Self-pay

## 2018-11-07 ENCOUNTER — Telehealth (INDEPENDENT_AMBULATORY_CARE_PROVIDER_SITE_OTHER): Payer: Self-pay | Admitting: Family Medicine

## 2018-11-07 DIAGNOSIS — M79672 Pain in left foot: Secondary | ICD-10-CM

## 2018-11-07 NOTE — Progress Notes (Signed)
Called patient regarding her concerns about work.  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  She works from [employer] 30[address]4.  She has a break at 915 for 15 minutes.  She then has 1/2-hour lunch break at 1145.  She has another break at 2 PM.  She reports she has a prior history of left foot surgery and standing on her feet all day in a new position is really bothering her.  She wears relatively new sneakers to work each day.  She reports redness and swelling of her left foot.  Given the redness and swelling I recommended evaluation in our office, appointment was offered today early this week.  She elected to take an appointment next Monday.  All questions were answered asked patient to wear a mask when she comes.  No charge as scheduled in next week with me for visit.   [name], MD  Family Medicine Teaching Service

## 2018-11-12 ENCOUNTER — Encounter: Payer: Self-pay | Admitting: Family Medicine

## 2018-11-12 ENCOUNTER — Other Ambulatory Visit (HOSPITAL_COMMUNITY)
Admission: RE | Admit: 2018-11-12 | Discharge: 2018-11-12 | Disposition: A | Discharge status: Home / Self Care | Payer: Medicare Other | Source: Ambulatory Visit | Attending: Family Medicine | Admitting: Family Medicine

## 2018-11-12 ENCOUNTER — Other Ambulatory Visit: Payer: Self-pay

## 2018-11-12 ENCOUNTER — Ambulatory Visit: Payer: Medicare Other | Admitting: Family Medicine

## 2018-11-12 VITALS — BP 142/72 | HR 94

## 2018-11-12 DIAGNOSIS — Z113 Encounter for screening for infections with a predominantly sexual mode of transmission: Secondary | ICD-10-CM | POA: Diagnosis not present

## 2018-11-12 DIAGNOSIS — M79672 Pain in left foot: Secondary | ICD-10-CM

## 2018-11-12 DIAGNOSIS — N9089 Other specified noninflammatory disorders of vulva and perineum: Secondary | ICD-10-CM

## 2018-11-12 DIAGNOSIS — I10 Essential (primary) hypertension: Secondary | ICD-10-CM | POA: Diagnosis not present

## 2018-11-12 DIAGNOSIS — H4722 Hereditary optic atrophy: Secondary | ICD-10-CM

## 2018-11-12 NOTE — Progress Notes (Signed)
  Patient Name: Allison Steele Date of Birth: [birth year] Date of Visit: 11/12/18 PCP: Martyn Malay, MD  Chief Complaint: left foot pain   Subjective: Allison Steele is a pleasant 48 y.o. with medical history significant for Leber's hereditary optic neuropathy, cutaneous sarcoidosis, hypertension, and mood disorder presenting today for left foot pain.   Allison Steele has a history of left medial ankle surgery approximately 8 years ago (by surgical scars, appears to be posterior tibial repair with some hardware). She cannot remember surgical specifics. Left ankle has been edematous since surgery. 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 Last week, due to employees being out for [place] she had to work in a different area, standing for a long period. She had left ankle pain and some worsening lateral foot swelling. This resolved over the weekend. Has tried nothing for this pain and discomfort. Does not use a brace. Wears Nikes at work. No knee, hip, or back pain.  Allison Steele was seen at Urgent Care and given doxycycline for folliculitis related to shaving. She reports the 'spots' have improved. No discharge, vaginal odor. She shaves 'all the time' with Carlton Adam. One new sexual partner in April. She is s/p a hysterectomy and UTD on  Pap.   Allison Steele is taking amlodipine which was previously started for hypertension.  She denies any side effects.  The ankle swelling does not start at the time she started the medication. .  Denies low mood, chest pain, dyspnea, cough.   ROS: as detailed above.  ROS  I have reviewed the patient's medical, surgical, family, and social history as appropriate.   Vitals:   11/12/18 0834  BP: (!) 142/72  Pulse: 94  SpO2: 99%   There were no vitals filed for this visit.  Cardiac: Warm well perfused.  Capillary refill less than 3 seconds Respiratory breathing comfortably on room air Psych: Pleasant normal affect, appropriate, normal rate of speech GU Exam:   Chaperoned exam.  External exam: Normal-appearing female external genitalia, no lesions.  Vaginal exam notable for normal discharge.No vulvar abnormalities or lesion.  Ankle: Bilateral ankle exam Left ankle with surgical scar medially over posterior tibialis tendon insertion and distally over first MTP Minimal swelling over the Achilles tendon, non tender No redness no warmth Bilateral calf measure 33 cm each   Allison Steele was seen today for left foot issue.  Diagnoses and all orders for this visit:  Routine screening for STI (sexually transmitted infection) -     Cervicovaginal ancillary only  Left foot pain this is likely an overuse injury in the setting of a prior surgery.  This is improving as she is no longer on her feet at all times.  I instructed the patient to call if she is to return to this type of work as I recommend she have breaks throughout the day.  Discussed icing her foot every single day 10 to 15 minutes after work and taking 2 Tylenol before going to work  Vulvar irritation exam is benign no folliculitis or irritation or concerning lesions on careful examination today.  STI screening completed as above given new partner.  Gust with patient that I recommend against using hair and shaving at the same time.  Waxing can be less irritating.   Essential hypertension currently at goal on Norvasc 5 mg.  She needs a repeat BMP at follow-up.    [name], MD  Family Medicine Teaching Service

## 2018-11-12 NOTE — Patient Instructions (Signed)
It was wonderful to see you today.  Thank you for choosing [facility].   Please call [phone] with any questions about today's appointment.  Please be sure to schedule follow up at the front  desk before you leave today.   Makayia Duplessis, MD  Family Medicine    

## 2018-11-13 ENCOUNTER — Telehealth: Payer: Self-pay | Admitting: Family Medicine

## 2018-11-13 LAB — CERVICOVAGINAL ANCILLARY ONLY
Chlamydia: NEGATIVE
Neisseria Gonorrhea: NEGATIVE

## 2018-11-13 NOTE — Telephone Encounter (Signed)
Called with negative GC/CT results.  [name], MD  Family Medicine Teaching Service

## 2018-12-18 ENCOUNTER — Other Ambulatory Visit: Payer: Self-pay

## 2018-12-18 MED ORDER — MELATONIN 3 MG PO TABS: 1.0000 | ORAL_TABLET | Freq: Every evening | ORAL | 3 refills | Status: DC | PRN

## 2019-01-03 ENCOUNTER — Other Ambulatory Visit: Payer: Self-pay | Admitting: *Deleted

## 2019-01-03 DIAGNOSIS — I10 Essential (primary) hypertension: Secondary | ICD-10-CM

## 2019-01-03 MED ORDER — AMLODIPINE BESYLATE 5 MG PO TABS: 5.0000 mg | ORAL_TABLET | Freq: Every day | ORAL | 3 refills | Status: DC

## 2019-01-29 ENCOUNTER — Other Ambulatory Visit: Payer: Self-pay

## 2019-01-29 DIAGNOSIS — I10 Essential (primary) hypertension: Secondary | ICD-10-CM

## 2019-01-29 MED ORDER — LISINOPRIL-HYDROCHLOROTHIAZIDE 20-25 MG PO TABS: 1.0000 | ORAL_TABLET | Freq: Every day | ORAL | 6 refills | Status: DC

## 2019-01-29 NOTE — Telephone Encounter (Signed)
Patient due for labs. Please schedule with me sometime in October.   Thank you, [name], MD  [facility] Teaching Service

## 2019-02-13 ENCOUNTER — Encounter: Payer: Self-pay | Admitting: Family Medicine

## 2019-02-13 ENCOUNTER — Telehealth: Payer: Self-pay

## 2019-02-13 NOTE — Telephone Encounter (Signed)
Printed letter and faxed to appropriate number.

## 2019-02-13 NOTE — Telephone Encounter (Signed)
Patient calls nurse line stating she needs a letter for work allowing her to have a chair at all times for when she needs it. Patient stated she spoke with PCP about this at her last visit. Patient would like the letter faxed to her employer.   [employer] [employer] [phone]

## 2019-02-13 NOTE — Telephone Encounter (Signed)
Letter in letters tabs, please print and fax to number below.  [name], MD  Family Medicine Teaching Service

## 2019-02-21 ENCOUNTER — Ambulatory Visit (INDEPENDENT_AMBULATORY_CARE_PROVIDER_SITE_OTHER): Payer: Medicare Other | Admitting: Family Medicine

## 2019-02-21 ENCOUNTER — Other Ambulatory Visit: Payer: Self-pay

## 2019-02-21 ENCOUNTER — Encounter: Payer: Self-pay | Admitting: Family Medicine

## 2019-02-21 VITALS — BP 145/82 | HR 99 | Ht 63.0 in | Wt 159.4 lb

## 2019-02-21 DIAGNOSIS — Z23 Encounter for immunization: Secondary | ICD-10-CM

## 2019-02-21 DIAGNOSIS — I10 Essential (primary) hypertension: Secondary | ICD-10-CM

## 2019-02-21 DIAGNOSIS — E663 Overweight: Secondary | ICD-10-CM | POA: Diagnosis not present

## 2019-02-21 LAB — POCT GLYCOSYLATED HEMOGLOBIN (HGB A1C): Hemoglobin A1C: 5 % (ref 4.0–5.6)

## 2019-02-21 MED ORDER — AMLODIPINE BESYLATE 5 MG PO TABS: 5.0000 mg | ORAL_TABLET | Freq: Every day | ORAL | 3 refills | Status: DC

## 2019-02-21 MED ORDER — LISINOPRIL-HYDROCHLOROTHIAZIDE 20-25 MG PO TABS: 1.0000 | ORAL_TABLET | Freq: Every day | ORAL | 3 refills | Status: DC

## 2019-02-21 NOTE — Patient Instructions (Signed)
Congratulations your diabetes check was completely normal your hemoglobin A1c was 5.0.  I will call you with the results of the rest of your labs.  Great to see you today.   It was wonderful to see you today.  Thank you for choosing [facility].   Please call [phone] with any questions about today's appointment.  Please be sure to schedule follow up at the front  desk before you leave today.   [name], MD  Family Medicine   Congratulations on your weight loss.  You should be proud of yourself.  Continue to avoid sugar sweetened beverages like sweet tea and sodas.  Doing your exercises at home is really helpful.  Please let me know if you need anything if you get sick you can please call our clinic and have a virtual visit I was happy to chat on the phone or we can do a video visit

## 2019-02-21 NOTE — Assessment & Plan Note (Addendum)
Blood pressure slightly above goal today.  We reviewed dietary changes.  Patient reports dietary indiscretions and she would like to monitor this at home.  Refill blood pressure medications.  Consider increasing amlodipine if blood pressure greater than 140/90 at follow-up.

## 2019-02-21 NOTE — Assessment & Plan Note (Signed)
Patient very concerned given her family history of diabetes.  She is lost 10 pounds.  Congratulated her on this.

## 2019-02-21 NOTE — Progress Notes (Signed)
  Patient Name: Allison Steele Date of Birth: 1970-06-21 Date of Visit: 02/21/19 PCP: Martyn Malay, MD  Chief Complaint:   Subjective: Allison Steele is a pleasant 48 y.o. with medical history significant for numerous hereditary optic neuropathy, hypertension, overweight status, sarcoidosis and legal blindness presenting today for follow-up.  Hypertension Patient reports she is compliant with her amlodipine in combination ACE inhibitor and thiazide diuretic.  She endorses some urinary frequency from her thiazide.  She denies chest pain, dry cough.  She feels overall well.  She does report that she had an increased amount of salt the last few days in a large amount of barbecue.  She would like to have monitor her blood pressure at home to make sure it is within range.  Anxiety The patient reports she is most anxious about having diabetes.  She reports her anxiety from the coronavirus is calm down.  She denies any side effects from her Wellbutrin.  Denies poor sleep or poor appetite.  She has been exercising twice a day every day for about 10 to 15 minutes.  She reports her mood overall is good.   ROS: Per HPI.   I have reviewed the patient's medical, surgical, family, and social history as appropriate.  Vitals:   02/21/19 1443  BP: (!) 145/82  Pulse: 99  SpO2: 100%   HEENT: Sclera anicteric. Dentition is moderate. Appears well hydrated. Neck: Supple Cardiac: Regular rate and rhythm. Normal S1/S2. No murmurs, rubs, or gallops appreciated. Lungs: Clear bilaterally to ascultation.  Psych: Pleasant and appropriate    Essential hypertension Blood pressure slightly above goal today.  We reviewed dietary changes.  Patient reports dietary indiscretions and she would like to monitor this at home.  Refill blood pressure medications.  Consider increasing amlodipine if blood pressure greater than 140/90 at follow-up.  Overweight Patient very concerned given her family history of  diabetes.  She is lost 10 pounds.  Congratulated her on this.  Hemoglobin A1c was normal today.  Influenza vaccine given.  She is up-to-date on healthcare maintenance.  Check in in 3 months on her blood pressure.  [name], MD  Family Medicine Teaching Service

## 2019-02-22 ENCOUNTER — Telehealth: Payer: Self-pay | Admitting: Family Medicine

## 2019-02-22 DIAGNOSIS — I10 Essential (primary) hypertension: Secondary | ICD-10-CM

## 2019-02-22 LAB — BASIC METABOLIC PANEL
BUN/Creatinine Ratio: 26 — ABNORMAL HIGH (ref 9–23)
BUN: 17 mg/dL (ref 6–24)
CO2: 23 mmol/L (ref 20–29)
Calcium: 9.5 mg/dL (ref 8.7–10.2)
Chloride: 101 mmol/L (ref 96–106)
Creatinine, Ser: 0.66 mg/dL (ref 0.57–1.00)
GFR calc Af Amer: 122 mL/min/{1.73_m2} (ref >59)
GFR calc non Af Amer: 106 mL/min/{1.73_m2} (ref >59)
Glucose: 107 mg/dL — ABNORMAL HIGH (ref 65–99)
Potassium: 3.1 mmol/L — ABNORMAL LOW (ref 3.5–5.2)
Sodium: 140 mmol/L (ref 134–144)

## 2019-02-22 LAB — LIPID PANEL
Chol/HDL Ratio: 2.3 ratio (ref 0.0–4.4)
Cholesterol, Total: 174 mg/dL (ref 100–199)
HDL: 77 mg/dL (ref >39)
LDL Chol Calc (NIH): 81 mg/dL (ref 0–99)
Triglycerides: 88 mg/dL (ref 0–149)
VLDL Cholesterol Cal: 16 mg/dL (ref 5–40)

## 2019-02-22 MED ORDER — SPIRONOLACTONE 25 MG PO TABS: 25.0000 mg | ORAL_TABLET | Freq: Every day | ORAL | 3 refills | Status: DC

## 2019-02-22 NOTE — Telephone Encounter (Signed)
Called patient with results. ASCVD is 2.4% Recommend repeat in several years.   Blood pressure was slightly above goal at visit. Discussed at length, given low potassium, will start spironolactone at low dose. Reviewed side effects. Repeat BMET at 2 weeks. Reminder sent to self.   [name], MD  Family Medicine Teaching Service

## 2019-03-05 NOTE — Telephone Encounter (Signed)
Call patient to remind her to get potassium checked.  Scheduled for appointment next week at 1:30 PM.  Order placed for metabolic panel.

## 2019-03-05 NOTE — Addendum Note (Signed)
Addended by: Owens Shark, CARINA on: 03/05/2019 05:40 PM   Modules accepted: Orders

## 2019-03-11 ENCOUNTER — Other Ambulatory Visit: Payer: Self-pay

## 2019-03-11 ENCOUNTER — Other Ambulatory Visit: Payer: Medicare Other

## 2019-03-11 DIAGNOSIS — I10 Essential (primary) hypertension: Secondary | ICD-10-CM

## 2019-03-12 LAB — BASIC METABOLIC PANEL
BUN/Creatinine Ratio: 17 (ref 9–23)
BUN: 16 mg/dL (ref 6–24)
CO2: 22 mmol/L (ref 20–29)
Calcium: 9.7 mg/dL (ref 8.7–10.2)
Chloride: 104 mmol/L (ref 96–106)
Creatinine, Ser: 0.93 mg/dL (ref 0.57–1.00)
GFR calc Af Amer: 85 mL/min/{1.73_m2} (ref >59)
GFR calc non Af Amer: 73 mL/min/{1.73_m2} (ref >59)
Glucose: 109 mg/dL — ABNORMAL HIGH (ref 65–99)
Potassium: 3.6 mmol/L (ref 3.5–5.2)
Sodium: 138 mmol/L (ref 134–144)

## 2019-03-13 ENCOUNTER — Telehealth: Payer: Self-pay | Admitting: Family Medicine

## 2019-03-13 NOTE — Telephone Encounter (Signed)
Called patient and provided number to [facility].

## 2019-03-13 NOTE — Telephone Encounter (Signed)
Called patient with results.   [name], MD  Family Medicine Teaching Service

## 2019-04-02 ENCOUNTER — Telehealth: Payer: Self-pay | Admitting: *Deleted

## 2019-04-02 DIAGNOSIS — D869 Sarcoidosis, unspecified: Secondary | ICD-10-CM

## 2019-04-02 MED ORDER — HYDROXYCHLOROQUINE SULFATE 200 MG PO TABS: 200.0000 mg | ORAL_TABLET | Freq: Two times a day (BID) | ORAL | 0 refills | Status: DC

## 2019-04-02 NOTE — Telephone Encounter (Signed)
Pt is having trouble getting a refill on her plaquenil from her rheumotologist.  She and the pharmacy have left messages.  She is requesting a refill from Dr. Owens Shark.  To PCP. [facility], CMA

## 2019-04-02 NOTE — Telephone Encounter (Signed)
Called patient, she will call Rheumatology for follow up. 30 day supply prescribed.   [name], MD  Family Medicine Teaching Service

## 2019-04-17 ENCOUNTER — Ambulatory Visit (INDEPENDENT_AMBULATORY_CARE_PROVIDER_SITE_OTHER): Payer: Medicare Other | Admitting: Psychology

## 2019-04-17 DIAGNOSIS — F329 Major depressive disorder, single episode, unspecified: Secondary | ICD-10-CM | POA: Diagnosis not present

## 2019-05-01 ENCOUNTER — Telehealth: Payer: Self-pay | Admitting: *Deleted

## 2019-05-01 NOTE — Telephone Encounter (Signed)
Pt calls fer several reasons.  She has a "bump on the back of her neck and I have a headache and dizziness"  After speaking with pt, she thinks the bump might be a bug bite and would like a provider to look at it. Appt made for Tuesday (she is off on this day).  She states that the headache and dizziness started yesterday but "is much better today".  She denies fever, SOB, cough, nausea or vomiting.  She will seek are if headache or dizziness returns.  Christen Bame, CMA

## 2019-05-01 NOTE — Telephone Encounter (Signed)
Called patient regarding multiple concerns.  She is most concerned about the spider bite on her neck.  No fevers chills, nausea or vomiting.  She reports she is not been scratching or injuring the area.  She will monitor it over the weekend for worsening.  Reports her headache is improved.  She did not have her typical breakfast or caffeine yesterday morning before going to bed she started her headache.  She will monitor for this gets worse over the weekend.  Russians were answered.  She is to call if her symptoms worsen will come to care on Tuesday for evaluation of the neck lesion.  [name], MD  Family Medicine Teaching Service

## 2019-05-07 ENCOUNTER — Ambulatory Visit: Payer: Medicare Other | Admitting: Family Medicine

## 2019-05-08 ENCOUNTER — Ambulatory Visit (INDEPENDENT_AMBULATORY_CARE_PROVIDER_SITE_OTHER): Payer: Medicare Other | Admitting: Psychology

## 2019-05-08 DIAGNOSIS — F329 Major depressive disorder, single episode, unspecified: Secondary | ICD-10-CM | POA: Diagnosis not present

## 2019-05-28 ENCOUNTER — Ambulatory Visit: Payer: Medicare Other | Admitting: Psychology

## 2019-06-17 ENCOUNTER — Ambulatory Visit (INDEPENDENT_AMBULATORY_CARE_PROVIDER_SITE_OTHER): Payer: Medicare Other | Admitting: Psychology

## 2019-06-17 ENCOUNTER — Encounter: Payer: Self-pay | Admitting: Family Medicine

## 2019-06-17 ENCOUNTER — Other Ambulatory Visit: Payer: Self-pay

## 2019-06-17 ENCOUNTER — Ambulatory Visit (INDEPENDENT_AMBULATORY_CARE_PROVIDER_SITE_OTHER): Payer: Medicare Other | Admitting: Family Medicine

## 2019-06-17 VITALS — BP 130/72 | HR 90 | Wt 158.8 lb

## 2019-06-17 DIAGNOSIS — Z1159 Encounter for screening for other viral diseases: Secondary | ICD-10-CM | POA: Insufficient documentation

## 2019-06-17 DIAGNOSIS — F329 Major depressive disorder, single episode, unspecified: Secondary | ICD-10-CM | POA: Diagnosis not present

## 2019-06-17 DIAGNOSIS — I10 Essential (primary) hypertension: Secondary | ICD-10-CM | POA: Diagnosis not present

## 2019-06-17 DIAGNOSIS — E785 Hyperlipidemia, unspecified: Secondary | ICD-10-CM | POA: Diagnosis not present

## 2019-06-17 DIAGNOSIS — R0600 Dyspnea, unspecified: Secondary | ICD-10-CM | POA: Diagnosis not present

## 2019-06-17 DIAGNOSIS — Z1211 Encounter for screening for malignant neoplasm of colon: Secondary | ICD-10-CM | POA: Insufficient documentation

## 2019-06-17 DIAGNOSIS — R0609 Other forms of dyspnea: Secondary | ICD-10-CM | POA: Insufficient documentation

## 2019-06-17 NOTE — Patient Instructions (Addendum)
It was wonderful to see you today.  Thank you for choosing [facility].   Please call [phone] with any questions about today's appointment.  Please be sure to schedule follow up at the front  desk before you leave today.   [name], MD  Family Medicine   1. Keep a diary (in phone) of when you have the breathing issue 2. I will call you with blood work results 3. Please schedule a virtual visit follow up in 2 weeks 4. Please go to [facility] imaging to have your x-ray in the next few days  Keep up the great work with weight loss!  Try an online App ([employer] or similar) for free workouts that are short and range in difficulty

## 2019-06-17 NOTE — Progress Notes (Signed)
  Patient Name: Allison Steele Date of Birth: 03-15-1971 Date of Visit: 06/17/19 PCP: Martyn Malay, MD  Chief Complaint: Blood pressure and other concerns  Subjective: Allison Steele is a pleasant 49 y.o. with medical history significant for levers hereditary optic neuropathy, sarcoidosis with cutaneous manifestations, hypertension and dyslipidemia presenting today for blood pressure checkup and new dyspnea.  Dyspnea on exertion. Patient reports 1 month of shortness of breath with this occurs when she is lifting heavy boxes or when she is doing something that requires additional exertion.  She reports she thinks is due to deconditioning.  Previously she was quite active.  This often occurs at work.  When she has been active.  She feels her ability to work out has decreased considerably with the Covid pandemic.  She used to enjoy doing exercise classes.  She reports the episodes last about 30 seconds to a minute.  She denies associated cough, chest pain, left arm pain, neck pain GI symptoms or palpitations.  She reports this typically happens when she is at work sometimes under stressful situations.  She is usually wearing a mask when it happens.  The patient has history of sarcoidosis with cutaneous manifestations.  She was last seen by pulmonary in January 2020--- she is follow up 1 minute.  History significant for lung cancer in her father.  Patient is a non-smoker.  There is no family history of premature cardiovascular disease.    ROS: Per HPI.   I have reviewed the patient's medical, surgical, family, and social history as appropriate.  Vitals:   06/17/19 0903  BP: 130/72  Pulse: 90  SpO2: 90%  HEENT: Sclera anicteric. Dentition is poor. Appears well hydrated. Neck: Supple Cardiac: Regular rate and rhythm. Normal S1/S2. No murmurs, rubs, or gallops appreciated. Lungs: Clear bilaterally to ascultation.  Extremities: Warm, well perfused without edema.  Skin: Warm, dry Psych:  Pleasant and appropriate   Dyspnea on exertion New problem, differential includes sarcoidosis, deconditioning, anemia. Less likely cardiac but must be considered. Will obtain CBC, CXR. Patient to keep diary of symptoms. No orthopnea, PND, chest pain, short in duration (cardiac less likely but still possible). PE unlikely given onset, duration of symptoms, PERC 0. Will message Pulmonary nurse to follow up with Dr. Ander Slade.   Essential hypertension At goal , repeat BMET today.   Encounter for hepatitis C screening test for low risk patient Ordered per USPSTF guidelines.   Dyslipidemia Repeat lipid as last elevated, encouraged to continue dietary changes. Recommended at home exercises.   Return to care in 1 month (virtual) to check breathing.   [name], MD  Family Medicine Teaching Service

## 2019-06-17 NOTE — Assessment & Plan Note (Signed)
Repeat lipid as last elevated, encouraged to continue dietary changes. Recommended at home exercises.

## 2019-06-17 NOTE — Assessment & Plan Note (Signed)
New problem, differential includes sarcoidosis, deconditioning, anemia. Less likely cardiac but must be considered. Will obtain CBC, CXR. Patient to keep diary of symptoms. No orthopnea, PND, chest pain, short in duration (cardiac less likely but still possible). PE unlikely given onset, duration of symptoms, PERC 0. Will message Pulmonary nurse to follow up with Dr. Ander Slade.

## 2019-06-17 NOTE — Assessment & Plan Note (Signed)
At goal , repeat BMET today.

## 2019-06-17 NOTE — Assessment & Plan Note (Signed)
Ordered per USPSTF guidelines.

## 2019-06-18 LAB — HCV COMMENT:

## 2019-06-18 LAB — CBC
Hematocrit: 39.4 % (ref 34.0–46.6)
Hemoglobin: 13.5 g/dL (ref 11.1–15.9)
MCH: 32.5 pg (ref 26.6–33.0)
MCHC: 34.3 g/dL (ref 31.5–35.7)
MCV: 95 fL (ref 79–97)
Platelets: 284 10*3/uL (ref 150–450)
RBC: 4.15 x10E6/uL (ref 3.77–5.28)
RDW: 11.9 % (ref 11.7–15.4)
WBC: 3.6 10*3/uL (ref 3.4–10.8)

## 2019-06-18 LAB — BASIC METABOLIC PANEL
BUN/Creatinine Ratio: 15 (ref 9–23)
BUN: 12 mg/dL (ref 6–24)
CO2: 23 mmol/L (ref 20–29)
Calcium: 9.8 mg/dL (ref 8.7–10.2)
Chloride: 102 mmol/L (ref 96–106)
Creatinine, Ser: 0.81 mg/dL (ref 0.57–1.00)
GFR calc Af Amer: 100 mL/min/{1.73_m2} (ref >59)
GFR calc non Af Amer: 87 mL/min/{1.73_m2} (ref >59)
Glucose: 86 mg/dL (ref 65–99)
Potassium: 3.7 mmol/L (ref 3.5–5.2)
Sodium: 140 mmol/L (ref 134–144)

## 2019-06-18 LAB — LDL CHOLESTEROL, DIRECT: LDL Direct: 98 mg/dL (ref 0–99)

## 2019-06-18 LAB — HEPATITIS C ANTIBODY (REFLEX): HCV Ab: 0.1 s/co ratio (ref 0.0–0.9)

## 2019-06-27 ENCOUNTER — Other Ambulatory Visit: Payer: Self-pay | Admitting: Obstetrics & Gynecology

## 2019-06-27 DIAGNOSIS — Z1231 Encounter for screening mammogram for malignant neoplasm of breast: Secondary | ICD-10-CM

## 2019-07-04 ENCOUNTER — Ambulatory Visit (INDEPENDENT_AMBULATORY_CARE_PROVIDER_SITE_OTHER): Payer: Medicare Other | Admitting: Psychology

## 2019-07-04 DIAGNOSIS — F329 Major depressive disorder, single episode, unspecified: Secondary | ICD-10-CM

## 2019-07-05 ENCOUNTER — Telehealth: Payer: Self-pay

## 2019-07-05 DIAGNOSIS — K219 Gastro-esophageal reflux disease without esophagitis: Secondary | ICD-10-CM

## 2019-07-05 MED ORDER — OMEPRAZOLE 20 MG PO CPDR: DELAYED_RELEASE_CAPSULE | ORAL | 3 refills | Status: DC

## 2019-07-05 NOTE — Telephone Encounter (Signed)
Capsule okay per insurance--Rx to pharmacy. Also available OTC.  [name], MD  Family Medicine Teaching Service

## 2019-07-12 ENCOUNTER — Telehealth: Payer: Self-pay

## 2019-07-12 NOTE — Telephone Encounter (Signed)
Called patient to discuss. Reports she is doing well. Only symptom loss of taste. Vitals noted, doing well. Reviewed reasons to call and return to care.   98- HR  129/72- BP [name], MD  Family Medicine Teaching Service

## 2019-07-12 NOTE — Telephone Encounter (Signed)
Agree with below. Addendum:    I believe that she has limits with moving and including, toileting, bathing, feeding, dressing and grooming. I believe the power wheelchair is needed for pt to be able to perform ADL's in her home

## 2019-07-12 NOTE — Telephone Encounter (Signed)
Pt calls nurse line to make provider aware that she is getting COVID tested today due to loss of smell and backaches. Patient reports no fever, cough, shortness of breath or difficulty breathing. This morning temperature 98.9, BP 129/79. Pt states that she has an appointment on February 19th. Instructed patient to call us back with results and we would make a plan from there.  ED precautions given  Talbot Grumbling, RN

## 2019-07-12 NOTE — Telephone Encounter (Signed)
Pt calls back to nurse line stating that she tested positive for COVID today. Patient is scheduled for office visit on 07/26/19. Pt was calling to see if she needs to reschedule appointment. According to CDC guidelines she would be over 10 days since symptom onset before scheduled appointment. Forwarding to PCP for further clarification.   Talbot Grumbling, RN

## 2019-07-15 ENCOUNTER — Ambulatory Visit: Payer: Medicare Other | Admitting: Family Medicine

## 2019-07-16 ENCOUNTER — Ambulatory Visit (INDEPENDENT_AMBULATORY_CARE_PROVIDER_SITE_OTHER): Payer: Medicare Other | Admitting: Psychology

## 2019-07-16 DIAGNOSIS — F329 Major depressive disorder, single episode, unspecified: Secondary | ICD-10-CM | POA: Diagnosis not present

## 2019-07-18 NOTE — Telephone Encounter (Signed)
Pt calls nurse line regarding concerns for low blood pressure. Patient reports the follow readings this am: 121/81, 96/71, 105/70. Readings taken while sitting at kitchen table. Pt reports that she has no symptoms of low blood pressure at this time. Advised patient to drink plenty of fluids, as lower blood pressures can occur from dehydration.   Pt also expresses concerns about elevated HR (95-102). Informed patient that HR is not concerning as she is not having any chest pain or difficulty breathing.   Patient also states that O2 dropped into low 80s since being diagnosed.Patient does have painted finger nails that could have altered the reading.   During encounter, patients oxygen saturation was at 98%. Patient able to talk in full sentences, no distress noted. Pt denies having any shortness of breath or difficulty breathing. Patient reports that she is currently having no symptoms of COVID at this time. Sense of smell returned on Tuesday (2/9).   Advised patient to change positions slowly as blood pressure has been lower than her normal and to call the office if she starts experiencing any symptoms related to hypotension.  ED precautions given.   To PCP  Please advise if patient needs any further advice.   Talbot Grumbling, RN

## 2019-07-19 NOTE — Telephone Encounter (Signed)
Called patient to check in.  Her oxygen saturation has been about 99%.  Her blood pressure is on the lower side.  She denies dizziness, chest pain, dyspnea.  She reports her only lingering symptom is a little bit of a cough.  Recommended she stop spironolactone for several days as she is worried her blood pressure is too low.  She will restart this on Monday.  All questions were answered reviewed reasons to call or return

## 2019-07-26 ENCOUNTER — Ambulatory Visit: Payer: Medicare Other | Admitting: Family Medicine

## 2019-07-26 ENCOUNTER — Telehealth: Payer: Self-pay

## 2019-07-26 NOTE — Telephone Encounter (Signed)
Patient calls nurse line reporting continuance of nasal congestion. Patient states that she is supposed to return to work on Monday 07/29/19. Patient is having no other symptoms at this time.   Advised patient to use humidifier, and steam from warm shower to help with nasal congestion. Encouraged to continue drinking plenty of fluids to thin nasal secretions.   Patient is to call office on Monday if she continues to have symptoms and to schedule virtual appointment with provider regarding when to safely return to work  To PCP  Talbot Grumbling, RN

## 2019-07-30 ENCOUNTER — Ambulatory Visit: Payer: Medicare Other | Admitting: Psychology

## 2019-08-06 ENCOUNTER — Ambulatory Visit: Payer: Medicare Other

## 2019-08-08 ENCOUNTER — Other Ambulatory Visit: Payer: Self-pay

## 2019-08-09 ENCOUNTER — Ambulatory Visit: Payer: Medicare Other | Admitting: Obstetrics & Gynecology

## 2019-08-09 ENCOUNTER — Encounter: Payer: Self-pay | Admitting: Obstetrics & Gynecology

## 2019-08-09 VITALS — BP 128/80 | Ht 63.0 in | Wt 157.6 lb

## 2019-08-09 DIAGNOSIS — Z01419 Encounter for gynecological examination (general) (routine) without abnormal findings: Secondary | ICD-10-CM | POA: Diagnosis not present

## 2019-08-09 DIAGNOSIS — Z90710 Acquired absence of both cervix and uterus: Secondary | ICD-10-CM

## 2019-08-09 NOTE — Progress Notes (Signed)
    [facility]ison [facility] 1970-12-20 [id number]   History:    49 y.o.  G1P1L1 Single  RP:  Established patient presenting for annual gyn exam   HPI: S/P Robotic TLH/Bilateral Salpingectomy 05/10/2017.  No pelvic pain.  No Sx of menopause.  Abstinent currently.  Urine/BMs wnl.  Breasts wnl.  BMI improved to 27.92.  Exercising at home regularly.  Health labs with Fam MD.  Past medical history,surgical history, family history and social history were all reviewed and documented in the EPIC chart.  Gynecologic History Patient's last menstrual period was 04/26/2017.  Obstetric History OB History  Gravida Para Term Preterm AB Living  1 1       1   SAB TAB Ectopic Multiple Live Births               # Outcome Date GA Lbr Len/2nd Weight Sex Delivery Anes PTL Lv  1 Para              ROS: A ROS was performed and pertinent positives and negatives are included in the history.  GENERAL: No fevers or chills. HEENT: No change in vision, no earache, sore throat or sinus congestion. NECK: No pain or stiffness. CARDIOVASCULAR: No chest pain or pressure. No palpitations. PULMONARY: No shortness of breath, cough or wheeze. GASTROINTESTINAL: No abdominal pain, nausea, vomiting or diarrhea, melena or bright red blood per rectum. GENITOURINARY: No urinary frequency, urgency, hesitancy or dysuria. MUSCULOSKELETAL: No joint or muscle pain, no back pain, no recent trauma. DERMATOLOGIC: No rash, no itching, no lesions. ENDOCRINE: No polyuria, polydipsia, no heat or cold intolerance. No recent change in weight. HEMATOLOGICAL: No anemia or easy bruising or bleeding. NEUROLOGIC: No headache, seizures, numbness, tingling or weakness. PSYCHIATRIC: No depression, no loss of interest in normal activity or change in sleep pattern.     Exam:   BP 128/80   Ht 5\' 3"  (1.6 m)   Wt 157 lb 9.6 oz (71.5 kg)   LMP 04/26/2017   BMI 27.92 kg/m   Body mass index is 27.92 kg/m.  General appearance : Well developed well  nourished female. No acute distress HEENT: Eyes: no retinal hemorrhage or exudates,  Neck supple, trachea midline, no carotid bruits, no thyroidmegaly Lungs: Clear to auscultation, no rhonchi or wheezes, or rib retractions  Heart: Regular rate and rhythm, no murmurs or gallops Breast:Examined in sitting and supine position were symmetrical in appearance, no palpable masses or tenderness,  no skin retraction, no nipple inversion, no nipple discharge, no skin discoloration, no axillary or supraclavicular lymphadenopathy Abdomen: no palpable masses or tenderness, no rebound or guarding Extremities: no edema or skin discoloration or tenderness  Pelvic: Vulva: Normal             Vagina: No gross lesions or discharge  Cervix/Uterus absent  Adnexa  Without masses or tenderness  Anus: Normal   Assessment/Plan:  48 y.o. female for annual exam   1. Well female exam with routine gynecological exam Gynecologic exam status post total hysterectomy.  Pap test January 2020 was negative, no indication to repeat the Pap test this year.  Breast exam normal.  Patient is scheduled for screening mammogram soon.  Body mass index improved at 27.92.  We will continue to exercise regularly.  Healthy nutrition.  Health labs with family physician.  2. S/P total hysterectomy  [name], 11:18 AM 08/09/2019

## 2019-08-09 NOTE — Patient Instructions (Signed)
1. Well female exam with routine gynecological exam Gynecologic exam status post total hysterectomy.  Pap test January 2020 was negative, no indication to repeat the Pap test this year.  Breast exam normal.  Patient is scheduled for screening mammogram soon.  Body mass index improved at 27.92.  We will continue to exercise regularly.  Healthy nutrition.  Health labs with family physician.  2. S/P total hysterectomy  [facility], it was a pleasure seeing you today!

## 2019-08-13 ENCOUNTER — Ambulatory Visit: Payer: Medicare Other | Admitting: Psychology

## 2019-08-16 ENCOUNTER — Encounter: Payer: Self-pay | Admitting: Family Medicine

## 2019-08-16 ENCOUNTER — Other Ambulatory Visit: Payer: Self-pay

## 2019-08-16 ENCOUNTER — Ambulatory Visit
Admission: RE | Admit: 2019-08-16 | Discharge: 2019-08-16 | Disposition: A | Discharge status: Home / Self Care | Payer: Medicare Other | Source: Ambulatory Visit | Attending: Obstetrics & Gynecology | Admitting: Obstetrics & Gynecology

## 2019-08-16 DIAGNOSIS — Z1231 Encounter for screening mammogram for malignant neoplasm of breast: Secondary | ICD-10-CM

## 2019-08-26 ENCOUNTER — Other Ambulatory Visit: Payer: Self-pay

## 2019-08-26 ENCOUNTER — Ambulatory Visit: Payer: Medicare Other

## 2019-08-26 DIAGNOSIS — K219 Gastro-esophageal reflux disease without esophagitis: Secondary | ICD-10-CM

## 2019-08-26 MED ORDER — OMEPRAZOLE 20 MG PO CPDR: DELAYED_RELEASE_CAPSULE | ORAL | 3 refills | Status: DC

## 2019-12-10 ENCOUNTER — Other Ambulatory Visit: Payer: Self-pay | Admitting: *Deleted

## 2019-12-10 DIAGNOSIS — F32A Depression, unspecified: Secondary | ICD-10-CM

## 2019-12-10 MED ORDER — BUPROPION HCL ER (SR) 150 MG PO TB12: 150.0000 mg | ORAL_TABLET | Freq: Two times a day (BID) | ORAL | 3 refills | Status: DC

## 2020-01-20 ENCOUNTER — Ambulatory Visit: Payer: Medicare Other | Admitting: Family Medicine

## 2020-02-09 ENCOUNTER — Telehealth: Payer: Self-pay | Admitting: Family Medicine

## 2020-02-09 DIAGNOSIS — I10 Essential (primary) hypertension: Secondary | ICD-10-CM

## 2020-02-09 MED ORDER — SPIRONOLACTONE 25 MG PO TABS: 25.0000 mg | ORAL_TABLET | Freq: Every day | ORAL | 3 refills | Status: DC

## 2020-02-09 NOTE — Telephone Encounter (Signed)
**  After Hours Call**  Patient calls with request for refill of spironolactone, a current home medication. Last labs in January, 2021, WNL. Patient has appointment with PCP Dr. Owens Shark on 02/24/20 at 10:50 AM for follow up, but she is out of medication today and would like to pick it up. Refill placed.  Gladys Damme, MD [facility], PGY-2

## 2020-02-13 ENCOUNTER — Other Ambulatory Visit: Payer: Self-pay | Admitting: Family Medicine

## 2020-02-13 DIAGNOSIS — K219 Gastro-esophageal reflux disease without esophagitis: Secondary | ICD-10-CM

## 2020-02-13 MED ORDER — OMEPRAZOLE 20 MG PO CPDR: DELAYED_RELEASE_CAPSULE | ORAL | 3 refills | Status: DC

## 2020-02-14 ENCOUNTER — Encounter: Payer: Self-pay | Admitting: Family Medicine

## 2020-02-14 ENCOUNTER — Other Ambulatory Visit: Payer: Self-pay

## 2020-02-14 ENCOUNTER — Ambulatory Visit: Payer: Medicare Other | Admitting: Family Medicine

## 2020-02-14 VITALS — BP 110/80 | HR 89 | Ht 63.0 in | Wt 160.4 lb

## 2020-02-14 DIAGNOSIS — D863 Sarcoidosis of skin: Secondary | ICD-10-CM

## 2020-02-14 DIAGNOSIS — Z23 Encounter for immunization: Secondary | ICD-10-CM | POA: Diagnosis not present

## 2020-02-14 DIAGNOSIS — F32A Depression, unspecified: Secondary | ICD-10-CM

## 2020-02-14 DIAGNOSIS — K219 Gastro-esophageal reflux disease without esophagitis: Secondary | ICD-10-CM

## 2020-02-14 DIAGNOSIS — F329 Major depressive disorder, single episode, unspecified: Secondary | ICD-10-CM | POA: Diagnosis not present

## 2020-02-14 DIAGNOSIS — I10 Essential (primary) hypertension: Secondary | ICD-10-CM | POA: Diagnosis not present

## 2020-02-14 DIAGNOSIS — K5904 Chronic idiopathic constipation: Secondary | ICD-10-CM

## 2020-02-14 DIAGNOSIS — R0600 Dyspnea, unspecified: Secondary | ICD-10-CM

## 2020-02-14 DIAGNOSIS — R0609 Other forms of dyspnea: Secondary | ICD-10-CM

## 2020-02-14 MED ORDER — LACTULOSE 10 GM/15ML PO SOLN: 10.0000 g | Freq: Every day | ORAL | 1 refills | Status: DC | PRN

## 2020-02-14 MED ORDER — AMLODIPINE BESYLATE 5 MG PO TABS: 5.0000 mg | ORAL_TABLET | Freq: Every day | ORAL | 3 refills | Status: DC

## 2020-02-14 MED ORDER — OMEPRAZOLE 20 MG PO CPDR: DELAYED_RELEASE_CAPSULE | ORAL | 3 refills | Status: DC

## 2020-02-14 MED ORDER — LISINOPRIL-HYDROCHLOROTHIAZIDE 20-25 MG PO TABS: 1.0000 | ORAL_TABLET | Freq: Every day | ORAL | 3 refills | Status: DC

## 2020-02-14 MED ORDER — BUPROPION HCL ER (SR) 150 MG PO TB12: 150.0000 mg | ORAL_TABLET | Freq: Two times a day (BID) | ORAL | 3 refills | Status: DC

## 2020-02-14 NOTE — Progress Notes (Signed)
    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  She reports overall she is feeling anxious.  She reports the pandemic has her on edge.  She is upset because it has recurred and work is stressful at this time.  She denies low mood or problems with sleep.  The patient's main concern today is constipation.  This started last week.  She ate a hamburger at [employer] which worsens her symptoms.  She has had  on and off constipation for some time.  She did have a small amount of blood in her bowel movement on Sunday (on wiping, now resolved)   This improved with over-the-counter cocktail of magnesium and cherries.  She reports MiraLAX and senna and Colace are all ineffective for her.  She reports she has had on and off constipation for some time. She has weight gain rather than loss and no fevers.  She is amenable to a colonoscopy in the future in the spring.  The patient reports compliance with her blood pressure medications and is not having difficulty with them.  She denies dizziness, headaches, chest pain or dyspnea.  She reports her dyspnea is overall improved and doing well from the standpoint  PERTINENT  PMH / PSH/Family/Social History : As noted above notable for the fact that the patient is on Plaquenil for cutaneous sarcoidosis and has a history of Leber's hereditary optic neuropathy  OBJECTIVE:   BP 110/80   Pulse 89   Ht 5\' 3"  (1.6 m)   Wt 160 lb 6.4 oz (72.8 kg)   LMP 04/26/2017   SpO2 100%   BMI 28.41 kg/m   HEENT: Sclera anicteric. Dentition is moderate. Appears well hydrated. Neck: Supple Cardiac: Regular rate and rhythm. Normal S1/S2. No murmurs, rubs, or gallops appreciated. Lungs: Clear bilaterally to ascultation.  Abdomen: Normoactive bowel sounds. No tenderness to deep or light  palpation. No rebound or guarding.  No abdominal distention no ascites no fluid wave or masses. Declined rectal examination.  Extremities: Warm, well perfused without edema.  Skin: Warm, dry Psych: Pleasant and appropriate   ASSESSMENT/PLAN:   Dyspnea on exertion This is improved continue to monitor for recurrence.  Given her history of sarcoidosis encouraged her to follow-up with pulmonary.  She would like to do so in the spring.  Essential hypertension Continue on current medications which include lisinopril, HCTZ amlodipine and spironolactone.  BMP today.  She is at goal and has no side effects from this medication.   Constipation, this is likely due to recent change in her food.  Differential does also include IBD less likely malignancy.  Given her age recommended colonoscopy at this time she declined.  She is amenable to this in the spring.  Prescribed lactulose for her to take as needed for constipation prescription to pharmacy.  Discussed how to take this medication and the side effects.  HCM Influenza vaccine given today.  Recommended colonoscopy given her constipation symptoms in the spring.  She is amenable to this.  [name], [place]

## 2020-02-14 NOTE — Assessment & Plan Note (Signed)
Continue on current medications which include lisinopril, HCTZ amlodipine and spironolactone.  BMP today.  She is at goal and has no side effects from this medication.

## 2020-02-14 NOTE — Patient Instructions (Signed)
It was wonderful to see you today.  Please bring ALL of your medications with you to every visit.   Today we talked about:  - a colonoscopy in the spring  - seeing the lung doctor in the spring  -- I will call you after 4 PM with results    Thank you for choosing [facility].   Please call [phone] with any questions about today's appointment.  Please be sure to schedule follow up at the front  desk before you leave today.   [name], MD  Family Medicine

## 2020-02-14 NOTE — Addendum Note (Signed)
Addended by: Owens Shark, Umair Rosiles on: 02/14/2020 05:55 PM   Modules accepted: Level of Service

## 2020-02-14 NOTE — Assessment & Plan Note (Signed)
This is improved continue to monitor for recurrence.  Given her history of sarcoidosis encouraged her to follow-up with pulmonary.  She would like to do so in the spring.

## 2020-02-15 ENCOUNTER — Telehealth: Payer: Self-pay | Admitting: Family Medicine

## 2020-02-15 LAB — BASIC METABOLIC PANEL
BUN/Creatinine Ratio: 19 (ref 9–23)
BUN: 16 mg/dL (ref 6–24)
CO2: 22 mmol/L (ref 20–29)
Calcium: 9.4 mg/dL (ref 8.7–10.2)
Chloride: 100 mmol/L (ref 96–106)
Creatinine, Ser: 0.84 mg/dL (ref 0.57–1.00)
GFR calc Af Amer: 95 mL/min/{1.73_m2} (ref >59)
GFR calc non Af Amer: 82 mL/min/{1.73_m2} (ref >59)
Glucose: 106 mg/dL — ABNORMAL HIGH (ref 65–99)
Potassium: 3.9 mmol/L (ref 3.5–5.2)
Sodium: 137 mmol/L (ref 134–144)

## 2020-02-15 NOTE — Telephone Encounter (Signed)
**   After Hours Call **  Nahima Ales calls the emergency line after hours regarding a voice mail left by Dr. Owens Shark about results. She wants to know her results. Dr. Owens Shark left a message in the chart that her BMP results were all within normal limits. Patient expressed gratitude.  Gladys Damme, MD [facility], PGY-2

## 2020-02-15 NOTE — Telephone Encounter (Signed)
Called patient---BMP is normal.    If patient calls back, please let her know her blood work is all normal. Her kidney function and electrolytes are all in normal range, including potassium.   [name], MD  Family Medicine Teaching Service

## 2020-02-24 ENCOUNTER — Other Ambulatory Visit: Payer: Self-pay | Admitting: *Deleted

## 2020-02-24 ENCOUNTER — Encounter: Payer: Self-pay | Admitting: Family Medicine

## 2020-02-24 DIAGNOSIS — K5904 Chronic idiopathic constipation: Secondary | ICD-10-CM

## 2020-02-24 MED ORDER — LACTULOSE 10 GM/15ML PO SOLN: 10.0000 g | Freq: Every day | ORAL | 1 refills | Status: DC | PRN

## 2020-03-06 ENCOUNTER — Encounter (HOSPITAL_COMMUNITY): Payer: Self-pay

## 2020-03-06 ENCOUNTER — Other Ambulatory Visit: Payer: Self-pay

## 2020-03-06 ENCOUNTER — Ambulatory Visit (HOSPITAL_COMMUNITY)
Admission: EM | Admit: 2020-03-06 | Discharge: 2020-03-06 | Disposition: A | Discharge status: Home / Self Care | Payer: Medicare Other | Attending: Physician Assistant | Admitting: Physician Assistant

## 2020-03-06 DIAGNOSIS — B373 Candidiasis of vulva and vagina: Secondary | ICD-10-CM

## 2020-03-06 DIAGNOSIS — B3731 Acute candidiasis of vulva and vagina: Secondary | ICD-10-CM

## 2020-03-06 MED ORDER — FLUCONAZOLE 150 MG PO TABS: ORAL_TABLET | ORAL | 0 refills | Status: DC

## 2020-03-06 NOTE — ED Provider Notes (Signed)
[place]    CSN: [id number] Arrival date & time: 03/06/20  0800      History   Chief Complaint Chief Complaint  Patient presents with  . Vaginitis    HPI Allison Steele is a 49 y.o. female.   Pt complains of avaginal irritation   The history is provided by the patient. No language interpreter was used.  Vaginal Itching This is a new problem. The problem occurs constantly. The problem has been gradually worsening. Nothing aggravates the symptoms. Nothing relieves the symptoms. She has tried nothing for the symptoms. The treatment provided no relief.    Past Medical History:  Diagnosis Date  . Anemia    3 MONTHS AGO, STARTED ON IRON PILLS  . Depression   . GERD (gastroesophageal reflux disease)   . Headache   . Hypertension   . Leber's hereditary optic neuropathy 08/26/2008   Leber's hereditary optic neuropathy - diagnosed at age 14. Loss of central vision bilaterally.     . Legally blind    Leber`s Optic Neuropathy - Dx at age 40, resulting in blindness, followed at [facility]  . Nephrolithiasis   . Sarcoidosis     - followed by Dr. Delman Cheadle, [facility], [phone]    Patient Active Problem List   Diagnosis Date Noted  . Dyspnea on exertion 06/17/2019  . Dyslipidemia 06/17/2019  . History of nephrolithiasis 05/17/2018  . Esophageal reflux 05/18/2014  . DEPRESSION/ANXIETY 05/13/2009  . 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 Essential hypertension 08/03/2006    Past Surgical History:  Procedure Laterality Date  . ANKLE FRACTURE SURGERY    . CESAREAN SECTION    . ROBOTIC ASSISTED TOTAL HYSTERECTOMY WITH SALPINGECTOMY Bilateral 05/10/2017   Procedure: ROBOTIC ASSISTED TOTAL HYSTERECTOMY WITH SALPINGECTOMY;  Surgeon: Princess Bruins, MD;  Location: [facility];  Service: Gynecology;  Laterality: Bilateral;  laparoscopic  Request 7:30am OR start time in [place] [facility] block time  Requests 2 1/2  hours Dr. would like to use Fenestrated Bipolar (in cupboard Rm 7. Blue cord there also)    OB History    Gravida  1   Para  1   Term      Preterm      AB      Living  1     SAB      TAB      Ectopic      Multiple      Live Births               Home Medications    Prior to Admission medications   Medication Sig Start Date End Date Taking? Authorizing Provider  amLODipine (NORVASC) 5 MG tablet Take 1 tablet (5 mg total) by mouth at bedtime. 02/14/20   Martyn Malay, MD  aspirin 500 MG EC tablet Take 500 mg by mouth once as needed for pain. Patient not taking: Reported on 02/14/2020    [provider]  buPROPion (WELLBUTRIN SR) 150 MG 12 hr tablet Take 1 tablet (150 mg total) by mouth 2 (two) times daily. 02/14/20   Martyn Malay, MD  fluconazole (DIFLUCAN) 150 MG tablet One tablet now. Repeat in 3 days if symptoms persist 03/06/20   Fransico Meadow, PA-C  hydroxychloroquine (PLAQUENIL) 200 MG tablet Take 1 tablet (200 mg total) by mouth 2 (two) times daily. 04/02/19   Martyn Malay, MD  lactulose (CHRONULAC) 10 GM/15ML solution Take 15 mLs (10 g total) by  mouth daily as needed for mild constipation or moderate constipation. 02/24/20   Martyn Malay, MD  lisinopril-hydrochlorothiazide (ZESTORETIC) 20-25 MG tablet Take 1 tablet by mouth daily. 02/14/20   Martyn Malay, MD  Melatonin 3 MG TABS Take 1 tablet (3 mg total) by mouth at bedtime as needed (for sleep). Patient not taking: Reported on 02/14/2020 12/18/18   Martyn Malay, MD  Multiple Vitamins-Minerals (ADULT ONE DAILY GUMMIES) CHEW Chew 1 tablet by mouth daily. Patient not taking: Reported on 02/14/2020    [provider]  omeprazole (PRILOSEC) 20 MG capsule TAKE 1 CAPSULE BY MOUTH EVERY DAY 02/14/20   Martyn Malay, MD  spironolactone (ALDACTONE) 25 MG tablet Take 1 tablet (25 mg total) by mouth at bedtime. 02/09/20   Gladys Damme, MD    Family History Family History  Problem  Relation Age of Onset  . Cancer Mother   . Hypertension Mother   . Alcohol abuse Father   . Breast cancer Neg Hx     Social History Social History   Tobacco Use  . Smoking status: Never Smoker  . Smokeless tobacco: Never Used  Vaping Use  . Vaping Use: Never used  Substance Use Topics  . Alcohol use: No  . Drug use: No     Allergies   Citalopram hydrobromide   Review of Systems Review of Systems  All other systems reviewed and are negative.    Physical Exam Triage Vital Signs ED Triage Vitals  Enc Vitals Group     BP 03/06/20 0824 131/86     Pulse Rate 03/06/20 0824 72     Resp 03/06/20 0824 18     Temp 03/06/20 0824 98.8 F (37.1 C)     Temp Source 03/06/20 0824 Oral     SpO2 03/06/20 0824 98 %     Weight --      Height --      Head Circumference --      Peak Flow --      Pain Score 03/06/20 0825 2     Pain Loc --      Pain Edu? --      Excl. in [place]? --    No data found.  Updated Vital Signs BP 131/86 (BP Location: Right Arm)   Pulse 72   Temp 98.8 F (37.1 C) (Oral)   Resp 18   LMP 04/26/2017   SpO2 98%   Visual Acuity Right Eye Distance:   Left Eye Distance:   Bilateral Distance:    Right Eye Near:   Left Eye Near:    Bilateral Near:     Physical Exam Constitutional:      Appearance: She is well-developed.  HENT:     Head: Normocephalic and atraumatic.  Eyes:     Conjunctiva/sclera: Conjunctivae normal.     Pupils: Pupils are equal, round, and reactive to light.  Cardiovascular:     Rate and Rhythm: Normal rate.  Abdominal:     Palpations: Abdomen is soft.     Tenderness: There is no abdominal tenderness.  Genitourinary:    Vagina: Vaginal discharge present.     Comments: Irritated, white discharge  Musculoskeletal:        General: Normal range of motion.     Cervical back: Normal range of motion and neck supple.  Skin:    General: Skin is warm.      UC Treatments / Results  Labs (all labs ordered are listed, but only  abnormal  results are displayed) Labs Reviewed - No data to display  EKG   Radiology No results found.  Procedures Procedures (including critical care time)  Medications Ordered in UC Medications - No data to display  Initial Impression / Assessment and Plan / UC Course  I have reviewed the triage vital signs and the nursing notes.  Pertinent labs & imaging results that were available during my care of the patient were reviewed by me and considered in my medical decision making (see chart for details).     MDM:  Pt advised to use a gentle soap Rx for diflucan Final Clinical Impressions(s) / UC Diagnoses   Final diagnoses:  Yeast vaginitis   Discharge Instructions   None    ED Prescriptions    Medication Sig Dispense Auth. Provider   fluconazole (DIFLUCAN) 150 MG tablet One tablet now. Repeat in 3 days if symptoms persist 2 tablet Fransico Meadow, [place]     PDMP not reviewed this encounter.  An After Visit Summary was printed and given to the patient.    Fransico Meadow, [place] 03/06/20 [phone]

## 2020-03-06 NOTE — ED Triage Notes (Signed)
Pt presents with vaginal irritation and itching X 10 days after using a different soap.

## 2020-04-26 ENCOUNTER — Other Ambulatory Visit: Payer: Self-pay

## 2020-04-26 ENCOUNTER — Ambulatory Visit (INDEPENDENT_AMBULATORY_CARE_PROVIDER_SITE_OTHER): Payer: Medicare Other

## 2020-04-26 ENCOUNTER — Encounter (HOSPITAL_COMMUNITY): Payer: Self-pay

## 2020-04-26 ENCOUNTER — Ambulatory Visit (HOSPITAL_COMMUNITY)
Admission: EM | Admit: 2020-04-26 | Discharge: 2020-04-26 | Disposition: A | Discharge status: Home / Self Care | Payer: Medicare Other | Attending: Family Medicine | Admitting: Family Medicine

## 2020-04-26 DIAGNOSIS — N761 Subacute and chronic vaginitis: Secondary | ICD-10-CM

## 2020-04-26 DIAGNOSIS — M25672 Stiffness of left ankle, not elsewhere classified: Secondary | ICD-10-CM | POA: Diagnosis not present

## 2020-04-26 DIAGNOSIS — M25572 Pain in left ankle and joints of left foot: Secondary | ICD-10-CM

## 2020-04-26 MED ORDER — TRIAMCINOLONE ACETONIDE 40 MG/ML IJ SUSP
40.0000 mg | Freq: Once | INTRAMUSCULAR | Status: AC
  Administered 2020-04-26: 40 mg via INTRAMUSCULAR

## 2020-04-26 MED ORDER — TRIAMCINOLONE ACETONIDE 40 MG/ML IJ SUSP
INTRAMUSCULAR | Status: AC
  Filled 2020-04-26: qty 1

## 2020-04-26 NOTE — ED Triage Notes (Signed)
Pt in with c/o left ankle pain that has been going on for 2 days.   Pt has taken ibuprofen with minimal relief Not painful to palpation  Denies any numbness, tingling or recent injury  Also c/o vaginal irritation that has been going on for over 1 month. States she was seen before for the same issue but the sxs never resolved.

## 2020-04-26 NOTE — ED Provider Notes (Signed)
[place]    CSN: [id number] Arrival date & time: 04/26/20  1441      History   Chief Complaint Chief Complaint  Patient presents with  . Ankle Pain    HPI Allison Steele is a 49 y.o. female.   Presenting today with 2 days of ankle pain and stiffness that she states started after having to stand all day for work 2 days in a row. She states she has a note that she needs to sit but was unable to do so those 2 days. No injury to the ankle, swelling, discoloration, numbness, tingling. States she did have surgery on the ankle many years ago (around 2004 per patient). Taking ibuprofen with good relief temporarily.   Also having persistent vaginal itching and irritation x 1 month. Was treated with diflucan x 2 last month for suspected yeast infection without benefit. Denies dysuria, hematuria, flank pain, abdominal pain, concern for STIs. Not currently trying anything OTC for sxs.      Past Medical History:  Diagnosis Date  . Anemia    3 MONTHS AGO, STARTED ON IRON PILLS  . Depression   . GERD (gastroesophageal reflux disease)   . Headache   . Hypertension   . Leber's hereditary optic neuropathy 08/26/2008   Leber's hereditary optic neuropathy - diagnosed at age 13. Loss of central vision bilaterally.     . Legally blind    Leber`s Optic Neuropathy - Dx at age 78, resulting in blindness, followed at [facility]  . Nephrolithiasis   . Sarcoidosis     - followed by Dr. Delman Cheadle, [facility], [phone]    Patient Active Problem List   Diagnosis Date Noted  . Dyspnea on exertion 06/17/2019  . Dyslipidemia 06/17/2019  . History of nephrolithiasis 05/17/2018  . Esophageal reflux 05/18/2014  . DEPRESSION/ANXIETY 05/13/2009  . 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 Essential hypertension 08/03/2006    Past Surgical History:  Procedure Laterality Date  . ANKLE FRACTURE SURGERY    . CESAREAN SECTION    .  ROBOTIC ASSISTED TOTAL HYSTERECTOMY WITH SALPINGECTOMY Bilateral 05/10/2017   Procedure: ROBOTIC ASSISTED TOTAL HYSTERECTOMY WITH SALPINGECTOMY;  Surgeon: Princess Bruins, MD;  Location: [facility];  Service: Gynecology;  Laterality: Bilateral;  laparoscopic  Request 7:30am OR start time in [place] [facility] block time  Requests 2 1/2 hours Dr. would like to use Fenestrated Bipolar (in cupboard Rm 7. Blue cord there also)    OB History    Gravida  1   Para  1   Term      Preterm      AB      Living  1     SAB      TAB      Ectopic      Multiple      Live Births               Home Medications    Prior to Admission medications   Medication Sig Start Date End Date Taking? Authorizing Provider  amLODipine (NORVASC) 5 MG tablet Take 1 tablet (5 mg total) by mouth at bedtime. 02/14/20   Martyn Malay, MD  aspirin 500 MG EC tablet Take 500 mg by mouth once as needed for pain. Patient not taking: Reported on 02/14/2020    [provider]  buPROPion (WELLBUTRIN SR) 150 MG 12 hr tablet Take 1 tablet (150 mg total) by mouth 2 (two) times daily. 02/14/20  Martyn Malay, MD  fluconazole (DIFLUCAN) 150 MG tablet One tablet now. Repeat in 3 days if symptoms persist 03/06/20   Fransico Meadow, PA-C  hydroxychloroquine (PLAQUENIL) 200 MG tablet Take 1 tablet (200 mg total) by mouth 2 (two) times daily. 04/02/19   Martyn Malay, MD  lactulose (CHRONULAC) 10 GM/15ML solution Take 15 mLs (10 g total) by mouth daily as needed for mild constipation or moderate constipation. 02/24/20   Martyn Malay, MD  lisinopril-hydrochlorothiazide (ZESTORETIC) 20-25 MG tablet Take 1 tablet by mouth daily. 02/14/20   Martyn Malay, MD  Melatonin 3 MG TABS Take 1 tablet (3 mg total) by mouth at bedtime as needed (for sleep). Patient not taking: Reported on 02/14/2020 12/18/18   Martyn Malay, MD  Multiple Vitamins-Minerals (ADULT ONE DAILY GUMMIES) CHEW Chew 1 tablet by mouth daily. Patient  not taking: Reported on 02/14/2020    [provider]  omeprazole (PRILOSEC) 20 MG capsule TAKE 1 CAPSULE BY MOUTH EVERY DAY 02/14/20   Martyn Malay, MD  spironolactone (ALDACTONE) 25 MG tablet Take 1 tablet (25 mg total) by mouth at bedtime. 02/09/20   Gladys Damme, MD    Family History Family History  Problem Relation Age of Onset  . Cancer Mother   . Hypertension Mother   . Alcohol abuse Father   . Breast cancer Neg Hx     Social History Social History   Tobacco Use  . Smoking status: Never Smoker  . Smokeless tobacco: Never Used  Vaping Use  . Vaping Use: Never used  Substance Use Topics  . Alcohol use: No  . Drug use: No     Allergies   Citalopram hydrobromide   Review of Systems Review of Systems PER HPI    Physical Exam Triage Vital Signs ED Triage Vitals  Enc Vitals Group     BP 04/26/20 1516 (!) 148/88     Pulse Rate 04/26/20 1516 85     Resp 04/26/20 1516 20     Temp 04/26/20 1516 98.1 F (36.7 C)     Temp Source 04/26/20 1516 Oral     SpO2 04/26/20 1516 99 %     Weight --      Height --      Head Circumference --      Peak Flow --      Pain Score 04/26/20 1514 8     Pain Loc --      Pain Edu? --      Excl. in [place]? --    No data found.  Updated Vital Signs BP (!) 148/88 (BP Location: Right Arm)   Pulse 85   Temp 98.1 F (36.7 C) (Oral)   Resp 20   LMP 04/26/2017   SpO2 99%   Visual Acuity Right Eye Distance:   Left Eye Distance:   Bilateral Distance:    Right Eye Near:   Left Eye Near:    Bilateral Near:     Physical Exam Vitals and nursing note reviewed.  Constitutional:      Appearance: Normal appearance. She is not ill-appearing.  HENT:     Head: Atraumatic.  Eyes:     Extraocular Movements: Extraocular movements intact.     Conjunctiva/sclera: Conjunctivae normal.  Cardiovascular:     Rate and Rhythm: Normal rate and regular rhythm.     Heart sounds: Normal heart sounds.  Pulmonary:     Effort:  Pulmonary effort is normal.     Breath  sounds: Normal breath sounds.  Abdominal:     General: Bowel sounds are normal. There is no distension.     Palpations: Abdomen is soft.     Tenderness: There is no abdominal tenderness. There is no guarding.  Genitourinary:    Comments: Deferred GU exam, self swab performed Musculoskeletal:        General: No swelling, tenderness, deformity or signs of injury. Normal range of motion.     Cervical back: Normal range of motion and neck supple.  Skin:    General: Skin is warm and dry.     Findings: No erythema.  Neurological:     Mental Status: She is alert and oriented to person, place, and time.  Psychiatric:        Mood and Affect: Mood normal.        Thought Content: Thought content normal.        Judgment: Judgment normal.     UC Treatments / Results  Labs (all labs ordered are listed, but only abnormal results are displayed) Labs Reviewed  CERVICOVAGINAL ANCILLARY ONLY    EKG   Radiology DG Ankle Complete Left  Result Date: 04/26/2020 CLINICAL DATA:  Two days of left ankle pain and stiffness. No new injury. EXAM: LEFT ANKLE COMPLETE - 3+ VIEW COMPARISON:  None. FINDINGS: There is no evidence of fracture, dislocation, or joint effusion. There is no evidence of arthropathy or other focal bone abnormality. Soft tissues are unremarkable. IMPRESSION: Negative. Electronically Signed   By: Dorise Bullion III M.D   On: 04/26/2020 16:50    Procedures Procedures (including critical care time)  Medications Ordered in UC Medications  triamcinolone acetonide (KENALOG-40) injection 40 mg (has no administration in time range)    Initial Impression / Assessment and Plan / UC Course  I have reviewed the triage vital signs and the nursing notes.  Pertinent labs & imaging results that were available during my care of the patient were reviewed by me and considered in my medical decision making (see chart for details).     Aptima swab  pending, suspect BV could be causing her ongoing sxs. Discussed good vaginal hygiene, supportive measures and treating based on swab results.   Left ankle x-ray negative for bony abnormality, suspect some arthritis from standing long periods. Reviewed continued OTC pain relievers, supportive shoes, and IM kenalog given in clinic. F/u with PCP.    Final Clinical Impressions(s) / UC Diagnoses   Final diagnoses:  Acute left ankle pain  Subacute vaginitis   Discharge Instructions   None    ED Prescriptions    None     PDMP not reviewed this encounter.   Volney American, [place] 04/26/20 1659

## 2020-04-27 ENCOUNTER — Other Ambulatory Visit: Payer: Self-pay

## 2020-04-27 DIAGNOSIS — F32A Depression, unspecified: Secondary | ICD-10-CM

## 2020-04-27 LAB — CERVICOVAGINAL ANCILLARY ONLY
Bacterial Vaginitis (gardnerella): NEGATIVE
Candida Glabrata: NEGATIVE
Candida Vaginitis: NEGATIVE
Chlamydia: NEGATIVE
Comment: NEGATIVE
Comment: NEGATIVE
Comment: NEGATIVE
Comment: NEGATIVE
Comment: NEGATIVE
Comment: NORMAL
Neisseria Gonorrhea: NEGATIVE
Trichomonas: NEGATIVE

## 2020-04-27 MED ORDER — BUPROPION HCL ER (SR) 150 MG PO TB12: 150.0000 mg | ORAL_TABLET | Freq: Two times a day (BID) | ORAL | 3 refills | Status: DC

## 2020-06-29 ENCOUNTER — Ambulatory Visit (INDEPENDENT_AMBULATORY_CARE_PROVIDER_SITE_OTHER): Payer: Medicare Other | Admitting: Psychology

## 2020-06-29 DIAGNOSIS — F329 Major depressive disorder, single episode, unspecified: Secondary | ICD-10-CM | POA: Diagnosis not present

## 2020-07-15 ENCOUNTER — Encounter: Payer: Self-pay | Admitting: Family Medicine

## 2020-07-15 ENCOUNTER — Other Ambulatory Visit: Payer: Self-pay

## 2020-07-15 ENCOUNTER — Ambulatory Visit (INDEPENDENT_AMBULATORY_CARE_PROVIDER_SITE_OTHER): Payer: Medicare Other | Admitting: Family Medicine

## 2020-07-15 VITALS — BP 125/80 | HR 106 | Ht 64.0 in | Wt 161.8 lb

## 2020-07-15 DIAGNOSIS — I10 Essential (primary) hypertension: Secondary | ICD-10-CM | POA: Diagnosis not present

## 2020-07-15 DIAGNOSIS — Z0000 Encounter for general adult medical examination without abnormal findings: Secondary | ICD-10-CM | POA: Diagnosis not present

## 2020-07-15 DIAGNOSIS — K5904 Chronic idiopathic constipation: Secondary | ICD-10-CM

## 2020-07-15 DIAGNOSIS — D863 Sarcoidosis of skin: Secondary | ICD-10-CM

## 2020-07-15 DIAGNOSIS — E785 Hyperlipidemia, unspecified: Secondary | ICD-10-CM

## 2020-07-15 MED ORDER — LACTULOSE 10 GM/15ML PO SOLN: 10.0000 g | Freq: Every day | ORAL | 1 refills | Status: DC | PRN

## 2020-07-15 MED ORDER — LOSARTAN POTASSIUM 50 MG PO TABS: 50.0000 mg | ORAL_TABLET | Freq: Every day | ORAL | 3 refills | Status: DC

## 2020-07-15 NOTE — Progress Notes (Signed)
SUBJECTIVE:   CHIEF COMPLAINT / HPI:   Allison Steele is a very pleasant 50 year old woman with history significant for Lebers hereditary optic neuropathy, hypertension, and sarcoidosis presenting today for routine follow-up.  Her biggest concern is ongoing urinary frequency.  She reports her lisinopril HCTZ combination pill makes her go to the bathroom too frequently at work.  Within hours of taking the medication she has ongoing urinary frequency.  She denies dysuria, loss of bowel or bladder function or hematuria.  She denies signs or symptoms of infection.  She is interested in switching medications at this time.  The patient reports otherwise she is doing well.  She has been using senna rather than MiraLAX or lactulose for constipation.  She uses this about twice a month.  No melena or hematochezia.  She is interested in reducing her risk of colorectal cancer and would like to discuss a referral to gastroenterology for colonoscopy given her age and risk factors.  No other changes in bowel or bladder habits.  The patient has a history of sarcoidosis, with cutaneous manifestations.  She has never had pulmonary manifestations of disease.  Over the last year she has actually had Covid twice.  She has been vaccinated and boosted.  She denies symptoms such as excessive fatigue, skin changes, dyspnea on exertion or chest pain at this time.  She was previously evaluated by pulmonary medicine and recommended to follow-up in a year.  She has not wanted to do this but is interested in talking with them again to discuss prognosis and if she should be on any medications.   PERTINENT  PMH / PSH/Family/Social History : Otherwise updated and reviewed medical history as appropriate.  She was last seen by Dr. Ander Slade in Jan 2020.  She has a visit with her ophthalmologist upcoming this spring.  She is due for mammogram in the spring as well.  OBJECTIVE:   BP 125/80   Pulse (!) 106   Ht 5\' 4"  (1.626 m)    Wt 161 lb 12.8 oz (73.4 kg)   LMP 04/26/2017   SpO2 98%   BMI 27.77 kg/m   Today's weight:  Last Weight  Most recent update: 07/15/2020  9:35 AM   Weight  73.4 kg (161 lb 12.8 oz)           Review of prior weights: [employer]   07/15/20 0934  Weight: 161 lb 12.8 oz (73.4 kg)    Pleasant woman in no distress Difficulty with site Cardiac exam regular rate and rhythm heart rate improved from documented on arrival after she walked into the room.  Lungs clear bilaterally to auscultation with no wheezes rales or rhonchi.  Abdomen soft nontender nondistended with no epigastric pain.  No lower extremity edema  ASSESSMENT/PLAN:   Annual Exam--UTD on vaccines. Mammogram and colonoscopy order placed. We discussed healthy eating habits, moderate physical activity for 30 minutes 5 times per week (or 150 minutes), avoiding tobacco products, safe alcohol consumption, and safe driving habits.    Essential hypertension Well controlled but with side effects requiring additional management.  We will stop her combination lisinopril HCTZ.  We will start losartan.  Recommended follow-up in a few weeks to check her basic metabolic panel.  Scheduled follow-up in April for blood pressure check.  She would like to wait till after her birthday to have further changes..  Cutaneous sarcoidosis Discussed, referral placed for pulmonary medicine as she has not been seen in over a year.  Appreciate  their recommendations and guidance for care.  Dyslipidemia Due for repeat, she declined labs today.  Will obtain at follow-up.   HCM Ordered colonoscopy    [name], MD  [place]

## 2020-07-15 NOTE — Assessment & Plan Note (Signed)
Well controlled but with side effects requiring additional management.  We will stop her combination lisinopril HCTZ.  We will start losartan.  Recommended follow-up in a few weeks to check her basic metabolic panel.  Scheduled follow-up in April for blood pressure check.  She would like to wait till after her birthday to have further changes.Marland Kitchen

## 2020-07-15 NOTE — Assessment & Plan Note (Signed)
Discussed, referral placed for pulmonary medicine as she has not been seen in over a year.  Appreciate their recommendations and guidance for care.

## 2020-07-15 NOTE — Assessment & Plan Note (Signed)
Due for repeat, she declined labs today.  Will obtain at follow-up.

## 2020-07-15 NOTE — Patient Instructions (Addendum)
It was wonderful to see you today.  Please bring ALL of your medications with you to every visit.   Today we talked about:  - STOP Lisinopril-HCTZ   - Friday start a medication called LOSARTAN    - Call me if you have issues with the medication  -- We will check labs in April  - You should be called by the lung doctors    - You should be called about your colonoscopy    Thank you for choosing [facility].   Please call [phone] with any questions about today's appointment.  Please be sure to schedule follow up at the front  desk before you leave today.   [name], MD  Family Medicine

## 2020-07-21 ENCOUNTER — Ambulatory Visit (INDEPENDENT_AMBULATORY_CARE_PROVIDER_SITE_OTHER): Payer: Medicare Other | Admitting: Psychology

## 2020-07-21 DIAGNOSIS — F329 Major depressive disorder, single episode, unspecified: Secondary | ICD-10-CM | POA: Diagnosis not present

## 2020-07-27 ENCOUNTER — Other Ambulatory Visit: Payer: Self-pay | Admitting: *Deleted

## 2020-07-27 DIAGNOSIS — K5904 Chronic idiopathic constipation: Secondary | ICD-10-CM

## 2020-07-27 MED ORDER — LACTULOSE 10 GM/15ML PO SOLN
10.0000 g | Freq: Every day | ORAL | 1 refills | Status: DC | PRN
Start: 2020-07-27 — End: 2020-08-10

## 2020-07-29 ENCOUNTER — Encounter: Payer: Self-pay | Admitting: Gastroenterology

## 2020-08-04 ENCOUNTER — Ambulatory Visit (INDEPENDENT_AMBULATORY_CARE_PROVIDER_SITE_OTHER): Payer: Medicare Other | Admitting: Psychology

## 2020-08-04 DIAGNOSIS — F1020 Alcohol dependence, uncomplicated: Secondary | ICD-10-CM

## 2020-08-04 DIAGNOSIS — F329 Major depressive disorder, single episode, unspecified: Secondary | ICD-10-CM

## 2020-08-10 ENCOUNTER — Encounter: Payer: Medicare Other | Admitting: Obstetrics & Gynecology

## 2020-08-10 ENCOUNTER — Other Ambulatory Visit: Payer: Self-pay

## 2020-08-10 DIAGNOSIS — K5904 Chronic idiopathic constipation: Secondary | ICD-10-CM

## 2020-08-10 MED ORDER — LACTULOSE 10 GM/15ML PO SOLN
10.0000 g | Freq: Every day | ORAL | 1 refills | Status: DC | PRN
Start: 2020-08-10 — End: 2021-12-30

## 2020-08-11 ENCOUNTER — Ambulatory Visit: Payer: Medicare Other | Admitting: Obstetrics & Gynecology

## 2020-08-13 ENCOUNTER — Ambulatory Visit: Payer: Medicare Other | Admitting: Gastroenterology

## 2020-08-25 ENCOUNTER — Other Ambulatory Visit: Payer: Self-pay

## 2020-08-25 ENCOUNTER — Ambulatory Visit: Payer: Medicare Other | Admitting: Obstetrics & Gynecology

## 2020-08-25 ENCOUNTER — Encounter: Payer: Self-pay | Admitting: Obstetrics & Gynecology

## 2020-08-25 VITALS — BP 134/80 | Ht 64.0 in | Wt 166.0 lb

## 2020-08-25 DIAGNOSIS — Z01419 Encounter for gynecological examination (general) (routine) without abnormal findings: Secondary | ICD-10-CM

## 2020-08-25 DIAGNOSIS — Z90710 Acquired absence of both cervix and uterus: Secondary | ICD-10-CM

## 2020-08-25 DIAGNOSIS — N898 Other specified noninflammatory disorders of vagina: Secondary | ICD-10-CM | POA: Diagnosis not present

## 2020-08-25 LAB — WET PREP FOR TRICH, YEAST, CLUE

## 2020-08-25 MED ORDER — TINIDAZOLE 500 MG PO TABS: 1000.0000 mg | ORAL_TABLET | Freq: Two times a day (BID) | ORAL | 0 refills | Status: AC

## 2020-08-25 NOTE — Progress Notes (Signed)
Allison Steele 22-Aug-1970 [id number]   History:    50 y.o. G1P1L1 Single  IE:[id number] presenting for annual gyn exam   HPI:S/P Robotic TLH/Bilateral Salpingectomy 05/10/2017. No pelvic pain. No Sx of menopause. Abstinent currently. Vulvar irritation from a sex toy, now improving. Urine/BMs wnl. Breasts wnl. BMI at 28.49. Exercising at home regularly. Health labs with Fam MD.  Past medical history,surgical history, family history and social history were all reviewed and documented in the EPIC chart.  Gynecologic History Patient's last menstrual period was 04/26/2017.  Obstetric History OB History  Gravida Para Term Preterm AB Living  1 1       1   SAB IAB Ectopic Multiple Live Births               # Outcome Date GA Lbr Len/2nd Weight Sex Delivery Anes PTL Lv  1 Para              ROS: A ROS was performed and pertinent positives and negatives are included in the history.  GENERAL: No fevers or chills. HEENT: No change in vision, no earache, sore throat or sinus congestion. NECK: No pain or stiffness. CARDIOVASCULAR: No chest pain or pressure. No palpitations. PULMONARY: No shortness of breath, cough or wheeze. GASTROINTESTINAL: No abdominal pain, nausea, vomiting or diarrhea, melena or bright red blood per rectum. GENITOURINARY: No urinary frequency, urgency, hesitancy or dysuria. MUSCULOSKELETAL: No joint or muscle pain, no back pain, no recent trauma. DERMATOLOGIC: No rash, no itching, no lesions. ENDOCRINE: No polyuria, polydipsia, no heat or cold intolerance. No recent change in weight. HEMATOLOGICAL: No anemia or easy bruising or bleeding. NEUROLOGIC: No headache, seizures, numbness, tingling or weakness. PSYCHIATRIC: No depression, no loss of interest in normal activity or change in sleep pattern.     Exam:   BP 134/80 (BP Location: Right Arm, Patient Position: Sitting, Cuff Size: Normal)   Ht 5\' 4"  (1.626 m)   Wt 166 lb (75.3 kg)   LMP 04/26/2017    BMI 28.49 kg/m   Body mass index is 28.49 kg/m.  General appearance : Well developed well nourished female. No acute distress HEENT: Eyes: no retinal hemorrhage or exudates,  Neck supple, trachea midline, no carotid bruits, no thyroidmegaly Lungs: Clear to auscultation, no rhonchi or wheezes, or rib retractions  Heart: Regular rate and rhythm, no murmurs or gallops Breast:Examined in sitting and supine position were symmetrical in appearance, no palpable masses or tenderness,  no skin retraction, no nipple inversion, no nipple discharge, no skin discoloration, no axillary or supraclavicular lymphadenopathy Abdomen: no palpable masses or tenderness, no rebound or guarding Extremities: no edema or skin discoloration or tenderness  Pelvic: Vulva: Normal             Vagina: No gross lesions or discharge.  Wet prep done.  Cervix/Uterus absent  Adnexa  Without masses or tenderness  Anus: Normal  Wet Prep:  Clue cells present.  Odor positive.   Assessment/Plan:  50 y.o. female for annual exam   1. Well female exam with routine gynecological exam Gynecologic exam status post total hysterectomy.  No indication for Pap test this year, Pap test negative in 2020.  Breast exam normal.  Last screening mammogram March 2021 was negative, will schedule a screening mammogram now.  Will do a colonoscopy at age 76.  Health labs with family physician.  Body mass index 28.49.  Continue with fitness and healthy nutrition.  2. Vaginal discharge Bacterial Vaginosis.  Will treat with Tinidazole.  Usage reviewed and prescription sent to pharmacy. - WET PREP FOR [place], YEAST, CLUE  3. S/P total hysterectomy  Other orders - tinidazole (TINDAMAX) 500 MG tablet; Take 2 tablets (1,000 mg total) by mouth 2 (two) times daily for 2 days.  [name], 3:59 PM 08/25/2020

## 2020-09-01 ENCOUNTER — Ambulatory Visit: Payer: Medicare Other | Admitting: Psychology

## 2020-09-05 ENCOUNTER — Telehealth: Payer: Self-pay | Admitting: Family Medicine

## 2020-09-05 ENCOUNTER — Other Ambulatory Visit: Payer: Self-pay | Admitting: Family Medicine

## 2020-09-05 DIAGNOSIS — D869 Sarcoidosis, unspecified: Secondary | ICD-10-CM

## 2020-09-05 MED ORDER — HYDROXYCHLOROQUINE SULFATE 200 MG PO TABS: 200.0000 mg | ORAL_TABLET | Freq: Two times a day (BID) | ORAL | 0 refills | Status: DC

## 2020-09-05 NOTE — Telephone Encounter (Signed)
**  After Hours/ Emergency Line Call**  Received a call to report that Allison Steele who states she has a history of sarcoidosis and has ran out of her Plaquenil which she normally gets from her specialist.  She states that she is tried for couple days to get in touch with them and has been unable to.  She states that our clinic has refilled this for her once in the past to give her time to get back in with her other provider and I do see this previous refill by our clinic documented.  She request if it is possible to get a few days of this medication until she can get back established with her provider.  I discussed with Dr. Thompson Grayer and we determined it would be appropriate to give the patient 1 week of this medication until she can get reestablished with her other provider.  I have sent 1 week refill of this medication to her pharmacy.   Lurline Del, DO PGY-2, [facility] 09/05/2020 10:52 AM

## 2020-09-05 NOTE — Progress Notes (Signed)
See telephone encounter for after-hours emergency line call.

## 2020-09-09 ENCOUNTER — Other Ambulatory Visit: Payer: Self-pay

## 2020-09-09 ENCOUNTER — Encounter: Payer: Self-pay | Admitting: Gastroenterology

## 2020-09-09 ENCOUNTER — Ambulatory Visit (INDEPENDENT_AMBULATORY_CARE_PROVIDER_SITE_OTHER): Payer: Medicare Other | Admitting: Gastroenterology

## 2020-09-09 VITALS — BP 124/72 | HR 72 | Ht 63.0 in | Wt 167.5 lb

## 2020-09-09 DIAGNOSIS — Z1211 Encounter for screening for malignant neoplasm of colon: Secondary | ICD-10-CM

## 2020-09-09 DIAGNOSIS — K5900 Constipation, unspecified: Secondary | ICD-10-CM

## 2020-09-09 NOTE — Progress Notes (Signed)
Reviewed.  Prue Lingenfelter L. Heddy Vidana, MD, MPH  

## 2020-09-09 NOTE — Progress Notes (Signed)
09/09/2020 Allison Steele [id number] 01/29/1971   HISTORY OF PRESENT ILLNESS: This is a 50 year old female who is new to our office.  She was referred here by Dr. Owens Shark in order to discuss colonoscopy and her issues with constipation.  She has never had colonoscopy in the past.  She tells me that she has been dealing with constipation for the past several months.  She says that she was drinking prune juice which helped initially.  Her PCP prescribed lactulose, which she was taking in very small amounts, but did not particularly care for it and did not think it really helped all that much.  She recently obtained some magnesium capsules.  She is not sure about any blood in her stools as she is legally blind.   Past Medical History:  Diagnosis Date  . Anemia    3 MONTHS AGO, STARTED ON IRON PILLS  . Depression   . GERD (gastroesophageal reflux disease)   . Headache   . Hypertension   . 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 Loss of central vision bilaterally.     . Legally blind    Leber`s Optic Neuropathy - Dx at age 72, resulting in blindness, followed at [facility]  . Nephrolithiasis   . Sarcoidosis     - followed by Dr. Delman Cheadle, [facility], [phone]   Past Surgical History:  Procedure Laterality Date  . ANKLE FRACTURE SURGERY Left   . CESAREAN SECTION    . ROBOTIC ASSISTED TOTAL HYSTERECTOMY WITH SALPINGECTOMY Bilateral 05/10/2017   Procedure: ROBOTIC ASSISTED TOTAL HYSTERECTOMY WITH SALPINGECTOMY;  Surgeon: Princess Bruins, MD;  Location: [facility];  Service: Gynecology;  Laterality: Bilateral;  laparoscopic  Request 7:30am OR start time in [place] [facility] block time  Requests 2 1/2 hours Dr. would like to use Fenestrated Bipolar (in cupboard Rm 7. Blue cord there also)    reports that she has never smoked. She has never used smokeless tobacco. She reports current alcohol use. She reports that she  does not use drugs. family history includes Alcohol abuse in her father; Cystic fibrosis in her sister; Diabetes in her maternal aunt, maternal grandmother, paternal aunt, and sister; Hypertension in her father, mother, sister, and son; Lung cancer in her father and mother; Pneumonia in her mother. Allergies  Allergen Reactions  . Citalopram Hydrobromide Other (See Comments)    Vivid dreams      Outpatient Encounter Medications as of 09/09/2020  Medication Sig  . amLODipine (NORVASC) 5 MG tablet Take 1 tablet (5 mg total) by mouth at bedtime.  Marland Kitchen buPROPion (WELLBUTRIN SR) 150 MG 12 hr tablet Take 1 tablet (150 mg total) by mouth 2 (two) times daily.  . hydroxychloroquine (PLAQUENIL) 200 MG tablet Take 1 tablet (200 mg total) by mouth 2 (two) times daily.  Marland Kitchen lactulose (CHRONULAC) 10 GM/15ML solution Take 15 mLs (10 g total) by mouth daily as needed for mild constipation or moderate constipation.  Marland Kitchen losartan (COZAAR) 50 MG tablet Take 1 tablet (50 mg total) by mouth at bedtime.  . Melatonin 3 MG TABS Take 1 tablet (3 mg total) by mouth at bedtime as needed (for sleep).  . Multiple Vitamins-Minerals (ADULT ONE DAILY GUMMIES) CHEW Chew 1 tablet by mouth daily.  Marland Kitchen omeprazole (PRILOSEC) 20 MG capsule TAKE 1 CAPSULE BY MOUTH EVERY DAY  . spironolactone (ALDACTONE) 25 MG tablet Take 1 tablet (25 mg total) by mouth at bedtime.   No facility-administered  encounter medications on file as of 09/09/2020.    REVIEW OF SYSTEMS  : All other systems reviewed and negative except where noted in the History of Present Illness.   PHYSICAL EXAM: BP 124/72 (BP Location: Left Arm, Patient Position: Sitting, Cuff Size: Normal)   Pulse 72   Ht 5\' 3"  (1.6 m) Comment: height measured without shoes  Wt 167 lb 8 oz (76 kg)   LMP 04/26/2017   BMI 29.67 kg/m  General: Well developed AA female in no acute distress Head: Normocephalic and atraumatic Eyes:  Sclerae anicteric, conjunctiva pink. Ears: Normal auditory  acuity Lungs: Clear throughout to auscultation; no W/R/R. Heart: Regular rate and rhythm; no M/R/G. Abdomen: Soft, non-distended.  BS present.  Non-tender. Rectal:  Will be done at the time of colonoscopy. Musculoskeletal: Symmetrical with no gross deformities  Skin: No lesions on visible extremities Extremities: No edema  Neurological: Alert oriented x 4, grossly non-focal Psychological:  Alert and cooperative. Normal mood and affect  ASSESSMENT AND PLAN: *Constipation:  Will try Miralax daily and increase to twice daily. *CRC screening: Never had colonoscopy in the past.  We will schedule with Dr. Tarri Glenn.  The risks, benefits, and alternatives to colonoscopy were discussed with the patient and she consents to proceed.   CC:  Martyn Malay, MD

## 2020-09-09 NOTE — Patient Instructions (Addendum)
If you are age 49 or older, your body mass index should be between 23-30. Your Body mass index is 29.67 kg/m. If this is out of the aforementioned range listed, please consider follow up with your Primary Care Provider.  If you are age 25 or younger, your body mass index should be between 19-25. Your Body mass index is 29.67 kg/m. If this is out of the aformentioned range listed, please consider follow up with your Primary Care Provider.   You have been scheduled for a colonoscopy. Please follow written instructions given to you at your visit today.  Please pick up your prep supplies at the pharmacy within the next 1-3 days. If you use inhalers (even only as needed), please bring them with you on the day of your procedure.  Due to recent changes in healthcare laws, you may see the results of your imaging and laboratory studies on MyChart before your provider has had a chance to review them.  We understand that in some cases there may be results that are confusing or concerning to you. Not all laboratory results come back in the same time frame and the provider may be waiting for multiple results in order to interpret others.  Please give [place] 48 hours in order for your provider to thoroughly review all the results before contacting the office for clarification of your results.   Start Miralax daily and increase to twice a day if needed.   Thank you for choosing me and [facility].  Alonza Bogus PA-C

## 2020-09-21 ENCOUNTER — Ambulatory Visit (INDEPENDENT_AMBULATORY_CARE_PROVIDER_SITE_OTHER): Payer: Medicare Other | Admitting: Psychology

## 2020-09-21 DIAGNOSIS — F1020 Alcohol dependence, uncomplicated: Secondary | ICD-10-CM

## 2020-09-21 DIAGNOSIS — F329 Major depressive disorder, single episode, unspecified: Secondary | ICD-10-CM

## 2020-09-22 ENCOUNTER — Telehealth: Payer: Self-pay

## 2020-09-22 NOTE — Telephone Encounter (Signed)
Patient LVM on nurse line with questions regarding medications. Attempted to return call to patient, no answer. Left HIPAA compliant VM for patient to return call to office to discuss further.    Talbot Grumbling, RN

## 2020-10-02 ENCOUNTER — Ambulatory Visit: Payer: Medicare Other | Admitting: Family Medicine

## 2020-10-02 ENCOUNTER — Encounter: Payer: Self-pay | Admitting: Family Medicine

## 2020-10-02 ENCOUNTER — Other Ambulatory Visit: Payer: Self-pay

## 2020-10-02 VITALS — BP 126/80 | HR 90 | Wt 167.0 lb

## 2020-10-02 DIAGNOSIS — D863 Sarcoidosis of skin: Secondary | ICD-10-CM

## 2020-10-02 DIAGNOSIS — I10 Essential (primary) hypertension: Secondary | ICD-10-CM

## 2020-10-02 DIAGNOSIS — E785 Hyperlipidemia, unspecified: Secondary | ICD-10-CM | POA: Diagnosis not present

## 2020-10-02 MED ORDER — FLUTICASONE PROPIONATE 50 MCG/ACT NA SUSP: 2.0000 | Freq: Every day | NASAL | 6 refills | Status: DC

## 2020-10-02 NOTE — Patient Instructions (Addendum)
It was wonderful to see you today.  Please bring ALL of your medications with you to every visit.   Today we talked about:  --Congratulations on going to the gym  Great work with your blood pressure    I checked the labs for your dermatologist and will fax them there  I will call your pharmacy    Thank you for choosing [facility].   Please call [phone] with any questions about today's appointment.  I will see you in August---let's plan for an 830 AM!!!   [name], MD  Family Medicine

## 2020-10-02 NOTE — Assessment & Plan Note (Signed)
Repeat lipid panel today. 

## 2020-10-02 NOTE — Assessment & Plan Note (Signed)
CBC and hepatic panel today given use of plaquenil.

## 2020-10-02 NOTE — Progress Notes (Signed)
    SUBJECTIVE:   CHIEF COMPLAINT / HPI:   Ms. Scrivens presents for blood pressure follow up. She reports no new concerns. Interestingly, her pharmacy gave her a refill on lisinopril. Denies headaches, dizziness, reports improvement in her urinary frequency.   Ms. 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   Patient requests a prescription for flonase as cheaper than OTC.   PERTINENT  PMH / PSH/Family/Social History :  Updated and reviewed.   OBJECTIVE:   BP 126/80   Pulse 90   Wt 167 lb (75.8 kg)   LMP 04/26/2017   SpO2 98%   BMI 29.58 kg/m   Today's weight:  Last Weight  Most recent update: 10/02/2020 10:13 AM   Weight  75.8 kg (167 lb)           Review of prior weights: Filed Weights   10/02/20 1013  Weight: 167 lb (75.8 kg)    Cardiac: Regular rate and rhythm. Normal S1/S2. No murmurs, rubs, or gallops appreciated. Lungs: Clear bilaterally to ascultation.  Psych: Pleasant and appropriate  Skin is warm and well perfused   ASSESSMENT/PLAN:   Essential hypertension Repeat BMP today as changed medications. Discussed adherence, congratulated on BP control.  Set up mychart for messaging.   Cutaneous sarcoidosis CBC and hepatic panel today given use of plaquenil.   Dyslipidemia Repeat lipid panel today.    HCM Previously referred for colonoscopy     [name], MD  [facility]

## 2020-10-02 NOTE — Assessment & Plan Note (Signed)
Repeat BMP today as changed medications. Discussed adherence, congratulated on BP control.  Set up mychart for messaging.

## 2020-10-03 LAB — CBC WITH DIFFERENTIAL/PLATELET
Basophils Absolute: 0.1 10*3/uL (ref 0.0–0.2)
Basos: 1 %
EOS (ABSOLUTE): 0.1 10*3/uL (ref 0.0–0.4)
Eos: 3 %
Hematocrit: 40.6 % (ref 34.0–46.6)
Hemoglobin: 13.7 g/dL (ref 11.1–15.9)
Immature Grans (Abs): 0 10*3/uL (ref 0.0–0.1)
Immature Granulocytes: 0 %
Lymphocytes Absolute: 1.6 10*3/uL (ref 0.7–3.1)
Lymphs: 36 %
MCH: 32.9 pg (ref 26.6–33.0)
MCHC: 33.7 g/dL (ref 31.5–35.7)
MCV: 97 fL (ref 79–97)
Monocytes Absolute: 0.6 10*3/uL (ref 0.1–0.9)
Monocytes: 13 %
Neutrophils Absolute: 2.1 10*3/uL (ref 1.4–7.0)
Neutrophils: 47 %
Platelets: 279 10*3/uL (ref 150–450)
RBC: 4.17 x10E6/uL (ref 3.77–5.28)
RDW: 12 % (ref 11.7–15.4)
WBC: 4.5 10*3/uL (ref 3.4–10.8)

## 2020-10-03 LAB — BASIC METABOLIC PANEL
BUN/Creatinine Ratio: 18 (ref 9–23)
BUN: 14 mg/dL (ref 6–24)
CO2: 24 mmol/L (ref 20–29)
Calcium: 9.6 mg/dL (ref 8.7–10.2)
Chloride: 101 mmol/L (ref 96–106)
Creatinine, Ser: 0.8 mg/dL (ref 0.57–1.00)
Glucose: 94 mg/dL (ref 65–99)
Potassium: 4 mmol/L (ref 3.5–5.2)
Sodium: 138 mmol/L (ref 134–144)
eGFR: 90 mL/min/{1.73_m2} (ref >59)

## 2020-10-03 LAB — HEPATIC FUNCTION PANEL
ALT: 29 IU/L (ref 0–32)
AST: 25 IU/L (ref 0–40)
Albumin: 4.5 g/dL (ref 3.8–4.8)
Alkaline Phosphatase: 57 IU/L (ref 44–121)
Bilirubin Total: 0.6 mg/dL (ref 0.0–1.2)
Bilirubin, Direct: 0.17 mg/dL (ref 0.00–0.40)
Total Protein: 7.1 g/dL (ref 6.0–8.5)

## 2020-10-03 LAB — LIPID PANEL
Chol/HDL Ratio: 2.6 ratio (ref 0.0–4.4)
Cholesterol, Total: 180 mg/dL (ref 100–199)
HDL: 69 mg/dL (ref >39)
LDL Chol Calc (NIH): 94 mg/dL (ref 0–99)
Triglycerides: 95 mg/dL (ref 0–149)
VLDL Cholesterol Cal: 17 mg/dL (ref 5–40)

## 2020-10-04 ENCOUNTER — Telehealth: Payer: Self-pay | Admitting: Family Medicine

## 2020-10-04 NOTE — Telephone Encounter (Signed)
Called with normal results.  ?Opie Fanton, MD  ?Family Medicine Teaching Service  ? ?

## 2020-10-21 ENCOUNTER — Telehealth: Payer: Self-pay | Admitting: Family Medicine

## 2020-10-21 NOTE — Telephone Encounter (Signed)
Received request from dermatology office to fax most recent labs.  Please fax all labs including hepatic panel and CBC and others to dermatology. Form in RN box.   [name], MD  Family Medicine Teaching Service

## 2020-10-22 NOTE — Telephone Encounter (Signed)
Faxed to dermatology office.   Talbot Grumbling, RN

## 2020-11-09 ENCOUNTER — Telehealth: Payer: Self-pay | Admitting: Gastroenterology

## 2020-11-09 MED ORDER — PLENVU 140 G PO SOLR: 1.0000 | ORAL | 0 refills | Status: DC

## 2020-11-09 NOTE — Telephone Encounter (Signed)
Plenvu sent to pharmacy as requested.

## 2020-11-09 NOTE — Telephone Encounter (Signed)
Pt states that her pharmacy did not receive prescription for Plenvu yet. Pls send it again to [facility] on [address]

## 2020-11-16 NOTE — Telephone Encounter (Signed)
Patient called to advise her insurance does not cover the Plenvu.

## 2020-11-16 NOTE — Telephone Encounter (Signed)
Returned call to patient.  Patient was offered over-the-counter Miralax prep, but she declined and stated she will purchase Plenvu from pharmacy.  Advised patient to return call to office with any further issues.  Patient agreed to plan and verbalized understanding.   No further questions.

## 2020-11-27 ENCOUNTER — Telehealth: Payer: Self-pay | Admitting: Gastroenterology

## 2020-11-27 ENCOUNTER — Other Ambulatory Visit: Payer: Self-pay

## 2020-11-27 MED ORDER — PLENVU 140 G PO SOLR: 1.0000 | ORAL | 0 refills | Status: DC

## 2020-11-27 NOTE — Telephone Encounter (Signed)
Patient calling to inform the prep med is too expensive. Pt is requesting alternative prep med.. Plz advise

## 2020-11-27 NOTE — Telephone Encounter (Signed)
Inbound call from patient stating is still not able to get medication.  Please advise.

## 2020-11-27 NOTE — Telephone Encounter (Signed)
Pharmacy has been contacted and aware to run the coupon attached to the Rx. Pt is aware to call and check in on the pharmacy and call me back if needed.

## 2020-11-27 NOTE — Telephone Encounter (Signed)
Due to the complications given by the pharmacy. Patient is gong to come pick up a free sample kit today before 5 pm

## 2020-11-27 NOTE — Telephone Encounter (Signed)
Pt called again stating that her pharmacy has not yet received prescription for prep. Her procedure is on Monday. She would like to speak with you directly. Pls call her.

## 2020-11-30 ENCOUNTER — Encounter: Payer: Self-pay | Admitting: Gastroenterology

## 2020-11-30 ENCOUNTER — Other Ambulatory Visit: Payer: Self-pay

## 2020-11-30 ENCOUNTER — Ambulatory Visit (AMBULATORY_SURGERY_CENTER): Payer: Medicare Other | Admitting: Gastroenterology

## 2020-11-30 VITALS — BP 99/59 | HR 68 | Temp 97.5°F | Resp 23 | Ht 63.0 in | Wt 167.0 lb

## 2020-11-30 DIAGNOSIS — K6389 Other specified diseases of intestine: Secondary | ICD-10-CM

## 2020-11-30 DIAGNOSIS — K5900 Constipation, unspecified: Secondary | ICD-10-CM | POA: Diagnosis not present

## 2020-11-30 DIAGNOSIS — Z1211 Encounter for screening for malignant neoplasm of colon: Secondary | ICD-10-CM

## 2020-11-30 DIAGNOSIS — K648 Other hemorrhoids: Secondary | ICD-10-CM

## 2020-11-30 DIAGNOSIS — K621 Rectal polyp: Secondary | ICD-10-CM

## 2020-11-30 DIAGNOSIS — D129 Benign neoplasm of anus and anal canal: Secondary | ICD-10-CM

## 2020-11-30 DIAGNOSIS — K635 Polyp of colon: Secondary | ICD-10-CM | POA: Diagnosis not present

## 2020-11-30 DIAGNOSIS — D125 Benign neoplasm of sigmoid colon: Secondary | ICD-10-CM

## 2020-11-30 MED ORDER — SODIUM CHLORIDE 0.9 % IV SOLN: 500.0000 mL | Freq: Once | INTRAVENOUS | Status: DC

## 2020-11-30 NOTE — Patient Instructions (Signed)
YOU HAD AN ENDOSCOPIC PROCEDURE TODAY AT THE [facility]:   Refer to the procedure report that was given to you for any specific questions about what was found during the examination.  If the procedure report does not answer your questions, please call your gastroenterologist to clarify.  If you requested that your care partner not be given the details of your procedure findings, then the procedure report has been included in a sealed envelope for you to review at your convenience later.  YOU SHOULD EXPECT: Some feelings of bloating in the abdomen. Passage of more gas than usual.  Walking can help get rid of the air that was put into your GI tract during the procedure and reduce the bloating. If you had a lower endoscopy (such as a colonoscopy or flexible sigmoidoscopy) you may notice spotting of blood in your stool or on the toilet paper. If you underwent a bowel prep for your procedure, you may not have a normal bowel movement for a few days.  Please Note:  You might notice some irritation and congestion in your nose or some drainage.  This is from the oxygen used during your procedure.  There is no need for concern and it should clear up in a day or so.  SYMPTOMS TO REPORT IMMEDIATELY:   Following lower endoscopy (colonoscopy or flexible sigmoidoscopy):  Excessive amounts of blood in the stool  Significant tenderness or worsening of abdominal pains  Swelling of the abdomen that is new, acute  Fever of 100F or higher   Following upper endoscopy (EGD)  Vomiting of blood or coffee ground material  New chest pain or pain under the shoulder blades  Painful or persistently difficult swallowing  New shortness of breath  Fever of 100F or higher  Black, tarry-looking stools  For urgent or emergent issues, a gastroenterologist can be reached at any hour by calling [phone]. Do not use MyChart messaging for urgent concerns.    DIET:  We do recommend a small meal at first, but  then you may proceed to your regular diet.  Drink plenty of fluids but you should avoid alcoholic beverages for 24 hours.  ACTIVITY:  You should plan to take it easy for the rest of today and you should NOT DRIVE or use heavy machinery until tomorrow (because of the sedation medicines used during the test).    FOLLOW UP: Our staff will call the number listed on your records 48-72 hours following your procedure to check on you and address any questions or concerns that you may have regarding the information given to you following your procedure. If we do not reach you, we will leave a message.  We will attempt to reach you two times.  During this call, we will ask if you have developed any symptoms of COVID 19. If you develop any symptoms (ie: fever, flu-like symptoms, shortness of breath, cough etc.) before then, please call [phone].  If you test positive for Covid 19 in the 2 weeks post procedure, please call and report this information to us.    If any biopsies were taken you will be contacted by phone or by letter within the next 1-3 weeks.  Please call us at [phone] if you have not heard about the biopsies in 3 weeks.    SIGNATURES/CONFIDENTIALITY: You and/or your care partner have signed paperwork which will be entered into your electronic medical record.  These signatures attest to the fact that that the information above on   your After Visit Summary has been reviewed and is understood.  Full responsibility of the confidentiality of this discharge information lies with you and/or your care-partner. 

## 2020-11-30 NOTE — Progress Notes (Signed)
Report to PACU, RN, vss, BBS= Clear.  

## 2020-11-30 NOTE — Progress Notes (Signed)
Called to room to assist during endoscopic procedure.  Patient ID and intended procedure confirmed with present staff. Received instructions for my participation in the procedure from the performing physician.  

## 2020-11-30 NOTE — Progress Notes (Signed)
VS taken by C.W. 

## 2020-11-30 NOTE — Op Note (Signed)
[facility] Patient Name: Mckaylin Bastien Procedure Date: 11/30/2020 7:52 AM MRN: [id number] Endoscopist: Thornton Park MD, MD Age: 50 Referring MD:  Date of Birth: Apr 25, 1971 Gender: Female Account #: [id number] Procedure:                Colonoscopy Indications:              Screening for colorectal malignant neoplasm, This                            is the patient's first colonoscopy, Incidental                            constipation noted Medicines:                Monitored Anesthesia Care Procedure:                Pre-Anesthesia Assessment:                           - Prior to the procedure, a History and Physical                            was performed, and patient medications and                            allergies were reviewed. The patient's tolerance of                            previous anesthesia was also reviewed. The risks                            and benefits of the procedure and the sedation                            options and risks were discussed with the patient.                            All questions were answered, and informed consent                            was obtained. Prior Anticoagulants: The patient has                            taken no previous anticoagulant or antiplatelet                            agents. ASA Grade Assessment: II - A patient with                            mild systemic disease. After reviewing the risks                            and benefits, the patient was deemed in  satisfactory condition to undergo the procedure.                           After obtaining informed consent, the colonoscope                            was passed under direct vision. Throughout the                            procedure, the patient's blood pressure, pulse, and                            oxygen saturations were monitored continuously. The                            CF [id number] #[id number] was introduced  through the anus                            and advanced to the the cecum, identified by                            appendiceal orifice and ileocecal valve. The                            colonoscopy was performed with moderate difficulty                            due to significant looping. Successful completion                            of the procedure was aided by applying abdominal                            pressure. The patient tolerated the procedure well.                            The quality of the bowel preparation was good. The                            ileocecal valve, appendiceal orifice, and rectum                            were photographed. Scope In: 8:02:00 AM Scope Out: 8:19:17 AM Scope Withdrawal Time: 0 hours 13 minutes 19 seconds  Total Procedure Duration: 0 hours 17 minutes 17 seconds  Findings:                 The perianal and digital rectal examinations were                            normal.                           Two flat polyps were found in the rectum. The  polyps were less than 1 mm in size. These polyps                            were removed with a cold biopsy forceps. Resection                            and retrieval were complete. Estimated blood loss                            was minimal.                           A 2 mm polyp was found in the sigmoid colon. The                            polyp was sessile. The polyp was removed with a                            cold snare. Resection and retrieval were complete.                            Estimated blood loss was minimal.                           A diffuse area of moderate melanosis was found in                            the entire colon.                           The exam was otherwise without abnormality on                            direct and retroflexion views except for small                            internal hemorrhoids. Complications:            No  immediate complications. Estimated blood loss:                            Minimal. Estimated Blood Loss:     Estimated blood loss was minimal. Impression:               - Two less than 1 mm polyps in the rectum, removed                            with a cold biopsy forceps. Resected and retrieved.                           - One 2 mm polyp in the sigmoid colon, removed with                            a cold snare. Resected and retrieved.                           -  Melanosis in the colon. Recommendation:           - Patient has a contact number available for                            emergencies. The signs and symptoms of potential                            delayed complications were discussed with the                            patient. Return to normal activities tomorrow.                            Written discharge instructions were provided to the                            patient.                           - Resume previous diet.                           - Continue present medications.                           - Await pathology results.                           - Repeat colonoscopy date to be determined after                            pending pathology results are reviewed for                            surveillance.                           - Emerging evidence supports eating a diet of                            fruits, vegetables, grains, calcium, and yogurt                            while reducing red meat and alcohol may reduce the                            risk of colon cancer.                           - Thank you for allowing me to be involved in your                            colon cancer prevention. Thornton Park MD, MD 11/30/2020 8:23:39 AM This report has been signed electronically.

## 2020-12-02 ENCOUNTER — Telehealth: Payer: Self-pay | Admitting: *Deleted

## 2020-12-02 NOTE — Telephone Encounter (Signed)
Have you developed a fever since your procedure? no  2.   Have you had an respiratory symptoms (SOB or cough) since your procedure? no  3.   Have you tested positive for COVID 19 since your procedure no  4.   Have you had any family members/close contacts diagnosed with the COVID 19 since your procedure?  no   If yes to any of these questions please route to Joylene John, RN and Joella Prince, RN  Follow up Call-  Call back number 11/30/2020  Post procedure Call Back phone  # [phone]  Permission to leave phone message Yes  Some recent data might be hidden     Patient questions:  Do you have a fever, pain , or abdominal swelling? No. Pain Score  0 *  Have you tolerated food without any problems? Yes.    Have you been able to return to your normal activities? Yes.    Do you have any questions about your discharge instructions: Diet   No. Medications  No. Follow up visit  No.  Do you have questions or concerns about your Care? No.  Actions: * If pain score is 4 or above: No action needed, pain <4.

## 2020-12-09 ENCOUNTER — Encounter: Payer: Self-pay | Admitting: Gastroenterology

## 2020-12-10 ENCOUNTER — Encounter: Payer: Self-pay | Admitting: Family Medicine

## 2020-12-10 DIAGNOSIS — Z0000 Encounter for general adult medical examination without abnormal findings: Secondary | ICD-10-CM | POA: Insufficient documentation

## 2021-01-07 ENCOUNTER — Other Ambulatory Visit: Payer: Self-pay

## 2021-01-07 ENCOUNTER — Ambulatory Visit: Payer: Medicare Other | Admitting: Pulmonary Disease

## 2021-01-07 ENCOUNTER — Encounter: Payer: Self-pay | Admitting: Pulmonary Disease

## 2021-01-07 VITALS — BP 112/72 | HR 74 | Temp 97.8°F | Ht 63.0 in | Wt 172.6 lb

## 2021-01-07 DIAGNOSIS — H4722 Hereditary optic atrophy: Secondary | ICD-10-CM | POA: Diagnosis not present

## 2021-01-07 DIAGNOSIS — D869 Sarcoidosis, unspecified: Secondary | ICD-10-CM

## 2021-01-07 NOTE — Patient Instructions (Signed)
Continue graded exercise as tolerated  I will see you on a yearly basis  Call with any respiratory complaints

## 2021-01-07 NOTE — Progress Notes (Signed)
Allison Steele    HR:[id number]    10-29-1970  Primary Care Physician:Brown, Raiford Simmonds, MD  Referring Physician: Martyn Malay, MD [place],  [place] [id number]  Chief complaint:   Patient with a history of sarcoidosis  HPI: Denies any significant symptoms today No cough, no shortness of breath with activity  Has been trying to stay more active going to the gym to exercise regularly Denies any chest pains or chest discomfort  Has not had any skin issues and has been using Plaquenil at reduced doses under the direction of a dermatologist  Sarcoidosis was diagnosed following biopsy of a skin lesion about 5 years ago Has been on Plaquenil with control of the skin lesions Denies any history of respiratory complaints No history of heart disease No history of kidney disease Skin lesions are well controlled at present  She has a history of lebers optic neuropathy, 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   buPROPion (WELLBUTRIN SR) 150 MG 12 hr tablet Take 1 tablet (150 mg total) by mouth 2 (two) times daily.   fluticasone (FLONASE) 50 MCG/ACT nasal spray Place 2 sprays into both nostrils daily.   hydroxychloroquine (PLAQUENIL) 200 MG tablet Take 1 tablet (200 mg total) by mouth 2 (two) times daily.   lactulose (CHRONULAC) 10 GM/15ML solution Take 15 mLs (10 g total) by mouth daily as needed for mild constipation or moderate constipation.   losartan (COZAAR) 50 MG tablet Take 1 tablet (50 mg total) by mouth at bedtime.   Melatonin 3 MG TABS Take 1 tablet (3 mg total) by mouth at bedtime as needed (for sleep).   Multiple Vitamins-Minerals (ADULT ONE DAILY GUMMIES) CHEW Chew 1 tablet by mouth daily.   omeprazole (PRILOSEC) 20 MG capsule TAKE 1 CAPSULE BY MOUTH EVERY DAY   spironolactone (ALDACTONE) 25 MG tablet Take 1 tablet (25  mg total) by mouth at bedtime.   No facility-administered encounter medications on file as of 01/07/2021.    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      Legally blind    Leber`s Optic Neuropathy - Dx at age 56, resulting in blindness, [facility]   Nephrolithiasis    Sarcoidosis     - followed by Dr. Delman Cheadle, [facility], [phone]    Past Surgical History:  Procedure Laterality Date   ANKLE FRACTURE SURGERY Left    CESAREAN SECTION     ROBOTIC ASSISTED TOTAL HYSTERECTOMY WITH SALPINGECTOMY Bilateral 05/10/2017   Procedure: ROBOTIC ASSISTED TOTAL HYSTERECTOMY WITH SALPINGECTOMY;  Surgeon: Princess Bruins, MD;  Location: [facility];  Service: Gynecology;  Laterality: Bilateral;  laparoscopic  Request 7:30am OR start time in [place] [facility] block time  Requests 2 1/2 hours Dr. would like to use Fenestrated Bipolar (in cupboard Rm 7. Blue cord there also)    Family History  Problem Relation Age of Onset   Hypertension Mother    Lung cancer Mother    Pneumonia Mother    Alcohol abuse Father    Lung cancer Father    Hypertension Father    Cystic fibrosis Sister    Diabetes Sister    Hypertension Sister    Diabetes Maternal Aunt    Diabetes Paternal Aunt    Diabetes Maternal Grandmother    Hypertension Son    Breast cancer Neg Hx    Colon cancer Neg Hx    Colon polyps Neg Hx    Esophageal cancer Neg Hx    Rectal cancer Neg Hx    Stomach cancer Neg Hx     Social History   Socioeconomic History   Marital status: Single    Spouse name:  Not on file   Number of children: 1   Years of education: Not on file   Highest education level: Not on file  Occupational History   Occupation: [employer]  Tobacco Use   Smoking status: Never   Smokeless tobacco: Never  Vaping Use   Vaping Use: Never used  Substance and Sexual Activity   Alcohol use: Yes    Comment: social   Drug use: No   Sexual activity: Not Currently  Other Topics Concern   Not on file  Social History Narrative   Not on file   Social Determinants of Health   Financial Resource Strain: Not on file  Food Insecurity: Not on file  Transportation Needs: Not on file  Physical Activity: Not on file  Stress: Not on file  Social Connections: Not on file  Intimate Partner Violence: Not on file    Review of Systems  Constitutional:  Negative for activity change.  HENT: Negative.    Eyes:  Positive for visual disturbance.  Respiratory: Negative.  Negative for apnea, cough, choking, chest tightness, shortness of breath, wheezing and stridor.   Cardiovascular:  Negative for chest pain and palpitations.  Gastrointestinal: Negative.   All other systems reviewed and are negative.  Vitals:   01/07/21 1555  BP: 112/72  Pulse: 74  Temp: 97.8 F (36.6 C)  SpO2: 100%     Physical Exam Constitutional:      Appearance: Normal appearance. She is well-developed.  Eyes:     General:        Right eye: No discharge.        Left eye: No discharge.  Neck:     Thyroid: No thyromegaly.     Trachea: No tracheal deviation.  Cardiovascular:     Rate and Rhythm: Normal rate and regular rhythm.     Heart sounds: Normal heart sounds.  Pulmonary:     Effort: Pulmonary effort is normal. No respiratory distress.     Breath sounds: No stridor. No wheezing or rhonchi.  Abdominal:     General: Bowel sounds are normal. There is no distension.     Palpations: Abdomen is soft.     Tenderness: There is no abdominal tenderness.  Musculoskeletal:     Cervical  back: No rigidity or tenderness.  Skin:    General: Skin is warm and dry.  Neurological:     Mental Status: She is alert.  Psychiatric:        Behavior: Behavior normal.   Chest x-ray 01/18/2018-reviewed by myself showing no acute infiltrate CT scan of the chest reviewed showing calcified adenopathy, small lung nodules-no evidence of scarring  PFT on 07/04/2018 reveals no  obstruction, no significant bronchodilator response, normal lung volumes  10/02/2020 BUN/creatinine of 14/0.8  Assessment:  Patient with a history of skin sarcoidosis -Has been stable  evaluation for possible pulmonary sarcoidosis -Has no significant symptoms -CT shows no significant adenopathy, no scarring -No evidence of scarring on chest x-ray -Denies shortness of breath with activity  She remains very active   Plan/Recommendations:  No evidence of active sarcoidosis affecting the lungs  Encouraged to continue to stay active  I will see a year from now    Sherrilyn Rist MD [facility] 01/07/2021, 4:09 PM  CC: Martyn Malay, MD

## 2021-01-18 ENCOUNTER — Ambulatory Visit: Payer: Medicare Other

## 2021-01-25 ENCOUNTER — Other Ambulatory Visit: Payer: Self-pay

## 2021-01-25 ENCOUNTER — Ambulatory Visit
Admission: RE | Admit: 2021-01-25 | Discharge: 2021-01-25 | Disposition: A | Discharge status: Home / Self Care | Payer: Medicare Other | Source: Ambulatory Visit | Attending: Family Medicine | Admitting: Family Medicine

## 2021-01-25 DIAGNOSIS — Z0000 Encounter for general adult medical examination without abnormal findings: Secondary | ICD-10-CM

## 2021-01-28 ENCOUNTER — Encounter: Payer: Self-pay | Admitting: Family Medicine

## 2021-02-05 ENCOUNTER — Other Ambulatory Visit: Payer: Self-pay | Admitting: Family Medicine

## 2021-02-05 DIAGNOSIS — I10 Essential (primary) hypertension: Secondary | ICD-10-CM

## 2021-03-05 ENCOUNTER — Other Ambulatory Visit: Payer: Self-pay

## 2021-03-05 ENCOUNTER — Ambulatory Visit: Payer: Medicare Other | Admitting: Family Medicine

## 2021-03-05 ENCOUNTER — Encounter: Payer: Self-pay | Admitting: Family Medicine

## 2021-03-05 VITALS — BP 145/88 | HR 93 | Wt 173.4 lb

## 2021-03-05 DIAGNOSIS — F341 Dysthymic disorder: Secondary | ICD-10-CM

## 2021-03-05 DIAGNOSIS — Z23 Encounter for immunization: Secondary | ICD-10-CM | POA: Diagnosis not present

## 2021-03-05 DIAGNOSIS — I10 Essential (primary) hypertension: Secondary | ICD-10-CM | POA: Diagnosis not present

## 2021-03-05 DIAGNOSIS — F32A Depression, unspecified: Secondary | ICD-10-CM

## 2021-03-05 DIAGNOSIS — K219 Gastro-esophageal reflux disease without esophagitis: Secondary | ICD-10-CM

## 2021-03-05 DIAGNOSIS — D863 Sarcoidosis of skin: Secondary | ICD-10-CM

## 2021-03-05 MED ORDER — AMLODIPINE BESYLATE 5 MG PO TABS: 5.0000 mg | ORAL_TABLET | Freq: Every day | ORAL | 3 refills | Status: DC

## 2021-03-05 MED ORDER — OMEPRAZOLE 20 MG PO CPDR: DELAYED_RELEASE_CAPSULE | ORAL | 3 refills | Status: DC

## 2021-03-05 MED ORDER — SPIRONOLACTONE 25 MG PO TABS: 25.0000 mg | ORAL_TABLET | Freq: Every day | ORAL | 3 refills | Status: DC

## 2021-03-05 MED ORDER — LOSARTAN POTASSIUM 50 MG PO TABS: 50.0000 mg | ORAL_TABLET | Freq: Every day | ORAL | 3 refills | Status: DC

## 2021-03-05 MED ORDER — BUPROPION HCL ER (SR) 150 MG PO TB12: 150.0000 mg | ORAL_TABLET | Freq: Two times a day (BID) | ORAL | 3 refills | Status: DC

## 2021-03-05 MED ORDER — MELATONIN 3 MG PO TABS: 3.0000 mg | ORAL_TABLET | Freq: Every evening | ORAL | 3 refills | Status: DC | PRN

## 2021-03-05 NOTE — Assessment & Plan Note (Signed)
This is not at goal.  Asked that she check her blood pressure 3 times per week at home and will call her to see what results are.  If persistently elevated increase losartan to 100 mg with a repeat BMP 2 weeks after that.  No symptoms of severely elevated pressures at time of visit.  She reports her blood pressure is actually quite good at home.

## 2021-03-05 NOTE — Progress Notes (Signed)
SUBJECTIVE:   CHIEF COMPLAINT: BP check, follow up and sleep  HPI:   Allison Steele is a 50 y.o. 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  Since her last visit the patient has had a normal mammogram.  She had a colonoscopy that was unremarkable, only hyperplastic polyps, follow-up in 2032.  The patient reports compliance with her blood pressure medication she does report she took them all last night but think she needs refills on several.  She denies headaches, chest pain, dyspnea, lower extremity edema.  She has a home blood pressure monitor and would much prefer to avoid additional medications at this time.  The patient regularly sees an eye doctor for Plaquenil.  She was recently seen by pulmonology for her sarcoidosis.  She has no lung involvement but is recommended to follow-up with them yearly.  She denies dyspnea.  The patient reports her GERD is under good control.  The patient is excited as she turns 51 this spring was planning her to [place].  she would like to consider gaining strength and as she describes toning herself.  She reports she knows what she is doing wrong.  She frequently snacks on high calorie foods.  She works out between 3 to 5 days/week for 30 to 60 minutes.  She does like vegetables and fruits.  She would like to limit her fried foods and overall caloric intake.  Reviewed weight trend with her.  PERTINENT  PMH / PSH/Family/Social History : Updated history As well as problem list.  OBJECTIVE:   BP (!) 145/88   Pulse 93   Wt 173 lb 6.4 oz (78.7 kg)   LMP 04/26/2017   SpO2 100%   BMI 30.72 kg/m   Today's weight:  Last Weight  Most recent update: 03/05/2021  8:24 AM    Weight  78.7 kg (173 lb 6.4 oz)            Review of prior weights: Filed Weights   03/05/21 0824  Weight: 173 lb 6.4 oz (78.7 kg)   Cardiac: Regular rate  and rhythm. Normal S1/S2. No murmurs, rubs, or gallops appreciated. Lungs: Clear bilaterally to ascultation.  Abdomen: Normoactive bowel sounds. No tenderness to deep or light palpation. No rebound or guarding.  Psych: Pleasant and appropriate  No peripheral edema   ASSESSMENT/PLAN:   Essential hypertension This is not at goal.  Asked that she check her blood pressure 3 times per week at home and will call her to see what results are.  If persistently elevated increase losartan to 100 mg with a repeat BMP 2 weeks after that.  No symptoms of severely elevated pressures at time of visit.  She reports her blood pressure is actually quite good at home.  Cutaneous sarcoidosis Follow-up with pulmonary yearly.  She is on Plaquenil for this.  She regularly sees ophthalmology for this and for her optic neuropathy  DEPRESSION/ANXIETY PHQ is 1.  She is doing well and is in good spirits.  Activity has helped her.    Undesired weight gain, with BMI in obese range, discussed decreasing high-calorie foods increasing leafy green vegetables.  We discussed the excess calories often and smoothies.  She will keep a food diary we will discuss at her phone call in the coming weeks.  Recommend A1c and TSH in the spring we did discuss this today which she declined. Encouraged increased physical activity, reducing sugar sweetened beverages.  Healthcare maintenance she was given the flu vaccine today.     [name], [place]

## 2021-03-05 NOTE — Assessment & Plan Note (Signed)
PHQ is 1.  She is doing well and is in good spirits.  Activity has helped her.

## 2021-03-05 NOTE — Assessment & Plan Note (Signed)
Follow-up with pulmonary yearly.  She is on Plaquenil for this.  She regularly sees ophthalmology for this and for her optic neuropathy

## 2021-03-05 NOTE — Patient Instructions (Signed)
It was wonderful to see you today.  Please bring ALL of your medications with you to every visit.   Today we talked about:  - Check your blood pressure 3 times per week for the next week--ideally once a day in the morning  - I will call you about these results  - Take pictures of what you eat (everything that goes in your mouth) for 3 days---we will review  I will call you in about 10 days after 4 PM about your blood pressure and food  Follow up in March---HAPPY BIRTHDAY!!!!!!    Thank you for choosing [facility].   Please call [phone] with any questions about today's appointment.  Please be sure to schedule follow up at the front  desk before you leave today.   [name], MD  Family Medicine

## 2021-03-17 ENCOUNTER — Telehealth: Payer: Self-pay | Admitting: Family Medicine

## 2021-03-17 NOTE — Telephone Encounter (Signed)
Attempted to call patient about blood pressure.  If patient calls back, please ask her if she has been able to check her blood pressure. Please ask for values of most recent readings.  [name], MD  Family Medicine Teaching Service

## 2021-03-18 ENCOUNTER — Telehealth: Payer: Self-pay | Admitting: Family Medicine

## 2021-03-18 NOTE — Telephone Encounter (Signed)
Called patient to check in on BP/food. Left generic voicemail. If calls back please schedule with visit with me (okay to be virtual) in November/December.  [name], MD  Family Medicine Teaching Service

## 2021-03-25 ENCOUNTER — Other Ambulatory Visit: Payer: Self-pay

## 2021-03-25 ENCOUNTER — Ambulatory Visit (INDEPENDENT_AMBULATORY_CARE_PROVIDER_SITE_OTHER): Payer: Medicare Other | Admitting: Family Medicine

## 2021-03-25 DIAGNOSIS — H6120 Impacted cerumen, unspecified ear: Secondary | ICD-10-CM | POA: Insufficient documentation

## 2021-03-25 DIAGNOSIS — I10 Essential (primary) hypertension: Secondary | ICD-10-CM

## 2021-03-25 DIAGNOSIS — H6121 Impacted cerumen, right ear: Secondary | ICD-10-CM | POA: Diagnosis not present

## 2021-03-25 NOTE — Assessment & Plan Note (Signed)
Used ear irrigation to remove cerumen from right ear canal. No TM erythema or bulging to indicate any otitis media.  No evidence of otitis externa. Encourage patient to continue using Flonase for any residual congestion symptoms Recommended against the use of small objects in the ear canal such as Q-tips or Bobby pins or keys, patient voiced understanding Recommended Debrox eardrops to help clean ears

## 2021-03-25 NOTE — Patient Instructions (Signed)
I recommend using your flonase nasal spray to help with any congestion like symptoms in your ears. I would also recommend using DeBrox ear drops if you notice increase in the amount of ear wax to help keep the canal clean.   Please schedule a follow up visit with Dr. Owens Shark so you can discuss any changes for your blood pressure.

## 2021-03-25 NOTE — Assessment & Plan Note (Signed)
Blood pressure mildly elevated today in office. Patient prefers to follow-up with PCP and plans to schedule follow-up prior to leaving office today Patient states that she would like to avoid make any changes to her blood pressure medications at this time

## 2021-03-25 NOTE — Progress Notes (Signed)
    SUBJECTIVE:   CHIEF COMPLAINT / HPI: ear congestion   Patient reports that her right ear feels stopped up. She reports that this has been present for one year. She had a RN check her ear and she was able to clean some wax out then but still feels that it is clogged. Patient reported using saline in her ear over the weekend with no changes. She states that for a brief time her hearing sounded muffled but has improved. She denies ear pain, sore throat, nasal congestion or post nasal drainage. She denies any recent upper respiratory illness. She denies foreign objects in her ear.   PERTINENT  PMH / PSH:  HTN  Depression/Anxiety  HLD    OBJECTIVE:   BP (!) 145/92   Pulse 74   Ht 5\' 3"  (1.6 m)   Wt 174 lb (78.9 kg)   LMP 04/26/2017   SpO2 100%   BMI 30.82 kg/m   General: female appearing stated age in no acute distress HEENT: MMM, no oral lesions noted,no oropharygeal erythema, Neck non-tender without lymphadenopathy, Right ear canal without erythema, yellow cerumen in canal  Pulm: Normal respiratory effort, stable on RA   ASSESSMENT/PLAN:   Cerumen impaction Used ear irrigation to remove cerumen from right ear canal. No TM erythema or bulging to indicate any otitis media.  No evidence of otitis externa. Encourage patient to continue using Flonase for any residual congestion symptoms Recommended against the use of small objects in the ear canal such as Q-tips or Bobby pins or keys, patient voiced understanding Recommended Debrox eardrops to help clean ears  Essential hypertension Blood pressure mildly elevated today in office. Patient prefers to follow-up with PCP and plans to schedule follow-up prior to leaving office today Patient states that she would like to avoid make any changes to her blood pressure medications at this time     Eulis Foster, MD [place]

## 2021-04-12 ENCOUNTER — Encounter: Payer: Self-pay | Admitting: Family Medicine

## 2021-04-12 ENCOUNTER — Ambulatory Visit (INDEPENDENT_AMBULATORY_CARE_PROVIDER_SITE_OTHER): Payer: Medicare Other | Admitting: Family Medicine

## 2021-04-12 ENCOUNTER — Other Ambulatory Visit: Payer: Self-pay

## 2021-04-12 VITALS — BP 142/80 | HR 98 | Ht 63.0 in | Wt 167.6 lb

## 2021-04-12 DIAGNOSIS — D863 Sarcoidosis of skin: Secondary | ICD-10-CM

## 2021-04-12 DIAGNOSIS — F32A Depression, unspecified: Secondary | ICD-10-CM | POA: Diagnosis not present

## 2021-04-12 DIAGNOSIS — J301 Allergic rhinitis due to pollen: Secondary | ICD-10-CM

## 2021-04-12 DIAGNOSIS — I10 Essential (primary) hypertension: Secondary | ICD-10-CM

## 2021-04-12 DIAGNOSIS — D869 Sarcoidosis, unspecified: Secondary | ICD-10-CM | POA: Diagnosis not present

## 2021-04-12 MED ORDER — LORATADINE 10 MG PO TABS: 10.0000 mg | ORAL_TABLET | Freq: Every day | ORAL | 11 refills | Status: DC

## 2021-04-12 MED ORDER — HYDROXYCHLOROQUINE SULFATE 200 MG PO TABS: 200.0000 mg | ORAL_TABLET | Freq: Two times a day (BID) | ORAL | 0 refills | Status: DC

## 2021-04-12 MED ORDER — FLUTICASONE PROPIONATE 50 MCG/ACT NA SUSP: 2.0000 | Freq: Every day | NASAL | 6 refills | Status: DC

## 2021-04-12 MED ORDER — BUPROPION HCL ER (SR) 150 MG PO TB12: 150.0000 mg | ORAL_TABLET | Freq: Two times a day (BID) | ORAL | 3 refills | Status: DC

## 2021-04-12 NOTE — Progress Notes (Signed)
    SUBJECTIVE:   CHIEF COMPLAINT: Blood pressure check HPI:   Allison Steele is a 50 y.o. 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  Since her last visit the patient has been regularly increasing her activity.  She is limited sodas.  He is only drinking Coke zeros.  She is very excited for her upcoming 51th birthday celebration wants to be as healthy as possible for this.  The patient reports compliance with her antihypertensive therapy.  She denies chest pain or dyspnea.  She reports her blood pressure readings at home are improved with her in the office.  Reviewed home readings and they range from 1 20-1 32.  Diastolic values are in 01X to 82 range.  She does not have her monitor with her today.  The patient reports a few day history of cough and some intermittent upper airway noises.  She denies dyspnea, chest pain, productive sputum.  She denies fever chills or sick contacts.  She is up-to-date on all of her vaccines.  She does have history of seasonal allergies she reports and is not taking her allergy medications.  She is tried nothing for her symptoms at this time.  PERTINENT  PMH / PSH/Family/Social History : Updated reviewed as below.  OBJECTIVE:   BP (!) 145/80   Pulse 98   Ht 5\' 3"  (1.6 m)   Wt 167 lb 9.6 oz (76 kg)   LMP 04/26/2017   SpO2 100%   BMI 29.69 kg/m   Today's weight:  Last Weight  Most recent update: 04/12/2021  9:55 AM    Weight  76 kg (167 lb 9.6 oz)            Review of prior weights: Filed Weights   04/12/21 0955  Weight: 167 lb 9.6 oz (76 kg)   Bilateral external record Tory canals are clear.  Bilateral tympanic membranes are within normal limits.  She does have some mild postnasal drip.  Oropharynx is otherwise benign.  Cardiac: Regular rate and rhythm. Normal S1/S2. No murmurs, rubs, or gallops appreciated. Lungs: Clear  bilaterally to ascultation.  Abdomen: Normoactive bowel sounds. No tenderness to deep or light palpation. No rebound or guarding.   Psych: Pleasant and appropriate    ASSESSMENT/PLAN:   Cutaneous sarcoidosis She reports issues getting her Plaquenil refilled.  Refill given for 1 month.  She regularly sees ophthalmology and is seen by pulmonary medicine yearly as well.  She follows with dermatology for this.  Essential hypertension Home readings at goal.  Continue current therapy.   Allergic Rhinitis most likely cause of symptoms, continue to monitor.  If her symptoms persist beyond 3 to 4 weeks obtain imaging and more advanced testing.  She has a history of sarcoidosis and this could progress to the lungs I think this is unlikely given her symptoms and normal exam.  Healthcare maintenance she is up-to-date on vaccines (has card with COVID booster on it).     [name], [place]

## 2021-04-12 NOTE — Patient Instructions (Addendum)
It was wonderful to see you today.  Please bring ALL of your medications with you to every visit.   Today we talked about: --For your cough: I sent in a nasal spray and pill to take--let me know if this goes on for more than 3 weeks   -- For your blood pressure: no changes in medications   --Congratulations on your activity and change in soda!!!!!  - I sent in your Plaquenil   Follow up in February before you turn 51 !    Thank you for choosing [facility].   Please call [phone] with any questions about today's appointment.  Please be sure to schedule follow up at the front  desk before you leave today.   [name], MD  Family Medicine

## 2021-04-12 NOTE — Assessment & Plan Note (Signed)
She reports issues getting her Plaquenil refilled.  Refill given for 1 month.  She regularly sees ophthalmology and is seen by pulmonary medicine yearly as well.  She follows with dermatology for this.

## 2021-04-12 NOTE — Assessment & Plan Note (Signed)
Home readings at goal.  Continue current therapy.

## 2021-07-13 ENCOUNTER — Ambulatory Visit (INDEPENDENT_AMBULATORY_CARE_PROVIDER_SITE_OTHER): Payer: Medicare Other

## 2021-07-13 ENCOUNTER — Other Ambulatory Visit: Payer: Self-pay

## 2021-07-13 VITALS — Ht 63.0 in | Wt 164.0 lb

## 2021-07-13 DIAGNOSIS — Z0000 Encounter for general adult medical examination without abnormal findings: Secondary | ICD-10-CM

## 2021-07-13 NOTE — Progress Notes (Addendum)
Subjective:   Allison Steele is a 51 y.o. female who presents for Medicare Annual (Subsequent) preventive examination.  Patient consented to have virtual visit and was identified by name and date of birth. Method of visit: Telephone  Encounter participants: Patient: Allison Steele - located at Home Nurse/Provider: Dorna Bloom - located at [facility] Others (if applicable): NA  Review of Systems: Defer to PCP  Cardiac Risk Factors include: hypertension  Objective:   Vitals: Ht 5\' 3"  (1.6 m)    Wt 164 lb (74.4 kg)    LMP 04/26/2017    BMI 29.05 kg/m   Body mass index is 29.05 kg/m.  Advanced Directives 07/13/2021 03/05/2021 10/02/2020 06/17/2019 11/12/2018 03/26/2018 01/26/2018  Does Patient Have a Medical Advance Directive? No No No No No No No  Would patient like information on creating a medical advance directive? No - Patient declined No - Patient declined No - Patient declined No - Patient declined No - Patient declined No - Patient declined No - Patient declined   Tobacco Social History   Tobacco Use  Smoking Status Never  Smokeless Tobacco Never     Clinical Intake:  Pre-visit preparation completed: Yes  Pain Score: 0-No pain  How often do you need to have someone help you when you read instructions, pamphlets, or other written materials from your doctor or pharmacy?: 3 - Sometimes What is the last grade level you completed in school?: Legally Blind- High School  Past Medical History:  Diagnosis Date   Anemia    3 MONTHS AGO, STARTED ON IRON PILLS   Depression    GERD (gastroesophageal reflux disease)    Headache    Hypertension    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 Delman Cheadle, [facility],  [phone]   Past Surgical History:  Procedure Laterality Date   ANKLE FRACTURE SURGERY Left    CESAREAN SECTION     ROBOTIC ASSISTED TOTAL HYSTERECTOMY WITH SALPINGECTOMY Bilateral 05/10/2017   Procedure: ROBOTIC ASSISTED TOTAL HYSTERECTOMY WITH SALPINGECTOMY;  Surgeon: Princess Bruins, MD;  Location: [facility];  Service: Gynecology;  Laterality: Bilateral;  laparoscopic  Request 7:30am OR start time in [place] [facility] block time  Requests 2 1/2 hours Dr. would like to use [facility]ipolar (in cupboard Rm 7. Blue cord there also)   Family History  Problem Relation Age of Onset   Hypertension Mother    Lung cancer Mother    Pneumonia Mother    Alcohol abuse Father    Lung cancer Father    Hypertension Father    Cystic fibrosis Sister    Diabetes Sister    Hypertension Sister    Diabetes Maternal Aunt    Diabetes Paternal Aunt    Diabetes Maternal Grandmother    Hypertension Son    Breast cancer Neg Hx    Colon cancer Neg Hx    Colon polyps Neg Hx    Esophageal cancer Neg Hx    Rectal cancer Neg Hx    Stomach cancer Neg Hx    Social History   Socioeconomic History   Marital status: Divorced    Spouse name: Not on file   Number of children: 1  Years of education: 70   Highest education level: High school graduate  Occupational History   Occupation: [employer]  Tobacco Use   Smoking status: Never   Smokeless tobacco: Never  Vaping Use   Vaping Use: Never used  Substance and Sexual Activity   Alcohol use: Yes    Comment: social   Drug use: No   Sexual activity: Not Currently  Other Topics Concern   Not on file  Social History Narrative   Patient lives alone in [place].   Patient has one son in the area.    Patient is legally blind and does not drive. Patient uses [employer] for transportation.    Patient exercises 3x per week for 1 hour.    Enjoys watching movies and reading.    Works at [employer] for [employer].   Social Determinants of  Health   Financial Resource Strain: Low Risk    Difficulty of Paying Living Expenses: Not hard at all  Food Insecurity: No Food Insecurity   Worried About Charity fundraiser in the Last Year: Never true   [place] in the Last Year: Never true  Transportation Needs: No Transportation Needs   Lack of Transportation (Medical): No   Lack of Transportation (Non-Medical): No  Physical Activity: Sufficiently Active   Days of Exercise per Week: 3 days   Minutes of Exercise per Session: 60 min  Stress: Stress Concern Present   Feeling of Stress : To some extent  Social Connections: Socially Isolated   Frequency of Communication with Friends and Family: More than three times a week   Frequency of Social Gatherings with Friends and Family: More than three times a week   Attends Religious Services: Never   Marine scientist or Organizations: No   Attends Archivist Meetings: Never   Marital Status: Divorced   Outpatient Encounter Medications as of 07/13/2021  Medication Sig   amLODipine (NORVASC) 5 MG tablet Take 1 tablet (5 mg total) by mouth at bedtime.   buPROPion (WELLBUTRIN SR) 150 MG 12 hr tablet Take 1 tablet (150 mg total) by mouth 2 (two) times daily.   fluticasone (FLONASE) 50 MCG/ACT nasal spray Place 2 sprays into both nostrils daily.   hydroxychloroquine (PLAQUENIL) 200 MG tablet Take 1 tablet (200 mg total) by mouth 2 (two) times daily.   lactulose (CHRONULAC) 10 GM/15ML solution Take 15 mLs (10 g total) by mouth daily as needed for mild constipation or moderate constipation.   loratadine (CLARITIN) 10 MG tablet Take 1 tablet (10 mg total) by mouth daily.   losartan (COZAAR) 50 MG tablet Take 1 tablet (50 mg total) by mouth at bedtime.   melatonin 3 MG TABS tablet Take 1 tablet (3 mg total) by mouth at bedtime as needed (for sleep).   Multiple Vitamins-Minerals (ADULT ONE DAILY GUMMIES) CHEW Chew 1 tablet by mouth daily.   omeprazole (PRILOSEC) 20 MG capsule  TAKE 1 CAPSULE BY MOUTH EVERY DAY   spironolactone (ALDACTONE) 25 MG tablet Take 1 tablet (25 mg total) by mouth daily.   No facility-administered encounter medications on file as of 07/13/2021.   Activities of Daily Living In your present state of health, do you have any difficulty performing the following activities: 07/13/2021  Hearing? N  Vision? Y  Difficulty concentrating or making decisions? N  Walking or climbing stairs? N  Dressing or bathing? N  Preparing Food and eating ? N  Using the Toilet? N  In the past six months, have you accidently leaked urine? N  Do you have problems with loss of bowel control? N  Managing your Medications? N  Managing your Finances? N  Housekeeping or managing your Housekeeping? N  Some recent data might be hidden   Patient Care Team: Martyn Malay, MD as PCP - General (Family Medicine)    Assessment:   This is a routine wellness examination for Allison Steele.  Exercise Activities and Dietary recommendations Current Exercise Habits: Home exercise routine, Type of exercise: treadmill;strength training/weights, Time (Minutes): 60, Frequency (Times/Week): 3, Weekly Exercise (Minutes/Week): 180, Exercise limited by: cardiac condition(s)   Goals      DIET - EAT MORE FRUITS AND VEGETABLES     DIET - INCREASE WATER INTAKE       Fall Risk Fall Risk  07/13/2021 03/05/2021 10/02/2020 06/17/2019 11/12/2018  Falls in the past year? 0 0 0 0 0  Number falls in past yr: 0 0 0 0 0  Injury with Fall? 0 0 0 - -  Risk for fall due to : Impaired vision - - - -  Follow up Falls prevention discussed - - - -   Patient reports normal gait and balance. Patient is vision impaired and sometimes uses a cane to ambulate.  Is the patient's home free of loose throw rugs in walkways, pet beds, electrical cords, etc?   yes      Grab bars in the bathroom? yes      Handrails on the stairs?   yes      Adequate lighting?   yes  Patient rating of health (0-10) scale: 8    Depression Screen PHQ 2/9 Scores 07/13/2021 07/13/2021 03/25/2021 03/05/2021  PHQ - 2 Score 0 0 0 0  PHQ- 9 Score - - - 1    Cognitive Function 6CIT Screen 07/13/2021  What Year? 0 points  What month? 0 points  What time? 0 points  Count back from 20 0 points  Months in reverse 0 points  Repeat phrase 0 points  Total Score 0   Immunization History  Administered Date(s) Administered   Hepatitis A, Adult 03/02/2018   Influenza Split 03/14/2011, 02/28/2012   Influenza Whole 03/27/2007, 03/07/2008, 03/18/2009, 03/17/2010   Influenza,inj,Quad PF,6+ Mos 05/29/2013, 02/20/2014, 05/06/2015, 02/23/2016, 02/28/2017, 03/02/2018, 02/21/2019, 02/14/2020, 03/05/2021   Moderna Sars-Covid-2 Vaccination 08/19/2019, 09/16/2019   PFIZER(Purple Top)SARS-COV-2 Vaccination 03/27/2020, 09/28/2020   Td 08/04/1997, 08/26/2008   Tdap 03/03/2016   Screening Tests Health Maintenance  Topic Date Due   COVID-19 Vaccine (5 - Booster for Moderna series) 11/23/2020   PAP SMEAR-Modifier  06/14/2021   TETANUS/TDAP  03/03/2026   COLONOSCOPY (Pts 45-46yrs Insurance coverage will need to be confirmed)  12/01/2030   INFLUENZA VACCINE  Completed   Hepatitis C Screening  Completed   HIV Screening  Completed   HPV VACCINES  Aged Out   Cancer Screenings: Lung: Low Dose CT Chest recommended if Age 59-80 years, 20 pack-year currently smoking OR have quit w/in 15years. Patient does not qualify. Breast:  Up to date on Mammogram? Yes   Up to date of Bone Density/Dexa? NA Colorectal: UTD Cervical: Pap Smear: Hysterectomy- last pap 06/14/2018  Additional Screenings: Hepatitis C Screening: Completed  HIV Screening: Completed   Plan:  PCP apt scheduled for 2/17. Due for [employer].  GYN apt 3/29- Pap Smear due. Keep up the great work exercising!!  I have personally reviewed and noted the following in the patients chart:   Medical and social  history Use of alcohol, tobacco or illicit drugs  Current  medications and supplements Functional ability and status Nutritional status Physical activity Advanced directives List of other physicians Hospitalizations, surgeries, and ER visits in previous 12 months Vitals Screenings to include cognitive, depression, and falls Referrals and appointments  In addition, I have reviewed and discussed with patient certain preventive protocols, quality metrics, and best practice recommendations. A written personalized care plan for preventive services as well as general preventive health recommendations were provided to patient.  This visit was conducted virtually in the setting of the [place] pandemic.    Dorna Bloom, [place]  07/13/2021    I have reviewed this visit and agree with the documentation.  [name], MD  Family Medicine Teaching Service

## 2021-07-13 NOTE — Patient Instructions (Signed)
You spoke to Allison Steele, Allison Steele over the phone for your annual wellness visit.  We discussed goals:   Goals      DIET - EAT MORE FRUITS AND VEGETABLES     DIET - INCREASE WATER INTAKE       We also discussed recommended health maintenance. As discussed, you are due for: Health Maintenance  Topic Date Due   COVID-19 Vaccine (5 - Booster for Moderna series) 11/23/2020   PAP SMEAR-Modifier  06/14/2021   TETANUS/TDAP  03/03/2026   COLONOSCOPY (Pts 45-8yrs Insurance coverage will need to be confirmed)  12/01/2030   INFLUENZA VACCINE  Completed   Hepatitis C Screening  Completed   HIV Screening  Completed   HPV VACCINES  Aged Out   PCP apt scheduled for 2/17. Due for [employer].  GYN apt 3/29- Pap Smear due. Keep up the great work exercising!!  Health Maintenance, Female Adopting a healthy lifestyle and getting preventive care are important in promoting health and wellness. Ask your health care provider about: The right schedule for you to have regular tests and exams. Things you can do on your own to prevent diseases and keep yourself healthy. What should I know about diet, weight, and exercise? Eat a healthy diet  Eat a diet that includes plenty of vegetables, fruits, low-fat dairy products, and lean protein. Do not eat a lot of foods that are high in solid fats, added sugars, or sodium. Maintain a healthy weight Body mass index (BMI) is used to identify weight problems. It estimates body fat based on height and weight. Your health care provider can help determine your BMI and help you achieve or maintain a healthy weight. Get regular exercise Get regular exercise. This is one of the most important things you can do for your health. Most adults should: Exercise for at least 150 minutes each week. The exercise should increase your heart rate and make you sweat (moderate-intensity exercise). Do strengthening exercises at least twice a week. This is in addition to the  moderate-intensity exercise. Spend less time sitting. Even light physical activity can be beneficial. Watch cholesterol and blood lipids Have your blood tested for lipids and cholesterol at 50 years of age, then have this test every 5 years. Have your cholesterol levels checked more often if: Your lipid or cholesterol levels are high. You are older than 50 years of age. You are at high risk for heart disease. What should I know about cancer screening? Depending on your health history and family history, you may need to have cancer screening at various ages. This may include screening for: Breast cancer. Cervical cancer. Colorectal cancer. Skin cancer. Lung cancer. What should I know about heart disease, diabetes, and high blood pressure? Blood pressure and heart disease High blood pressure causes heart disease and increases the risk of stroke. This is more likely to develop in people who have high blood pressure readings or are overweight. Have your blood pressure checked: Every 3-5 years if you are 8-96 years of age. Every year if you are 35 years old or older. Diabetes Have regular diabetes screenings. This checks your fasting blood sugar level. Have the screening done: Once every three years after age 77 if you are at a normal weight and have a low risk for diabetes. More often and at a younger age if you are overweight or have a high risk for diabetes. What should I know about preventing infection? Hepatitis B If you have a higher risk for  hepatitis B, you should be screened for this virus. Talk with your health care provider to find out if you are at risk for hepatitis B infection. Hepatitis C Testing is recommended for: Everyone born from [birth year] through 1964. Anyone with known risk factors for hepatitis C. Sexually transmitted infections (STIs) Get screened for STIs, including gonorrhea and chlamydia, if: You are sexually active and are younger than 50 years of age. You are  older than 51 years of age and your health care provider tells you that you are at risk for this type of infection. Your sexual activity has changed since you were last screened, and you are at increased risk for chlamydia or gonorrhea. Ask your health care provider if you are at risk. Ask your health care provider about whether you are at high risk for HIV. Your health care provider may recommend a prescription medicine to help prevent HIV infection. If you choose to take medicine to prevent HIV, you should first get tested for HIV. You should then be tested every 3 months for as long as you are taking the medicine. Pregnancy If you are about to stop having your period (premenopausal) and you may become pregnant, seek counseling before you get pregnant. Take 400 to 800 micrograms (mcg) of folic acid every day if you become pregnant. Ask for birth control (contraception) if you want to prevent pregnancy. Osteoporosis and menopause Osteoporosis is a disease in which the bones lose minerals and strength with aging. This can result in bone fractures. If you are 83 years old or older, or if you are at risk for osteoporosis and fractures, ask your health care provider if you should: Be screened for bone loss. Take a calcium or vitamin D supplement to lower your risk of fractures. Be given hormone replacement therapy (HRT) to treat symptoms of menopause. Follow these instructions at home: Alcohol use Do not drink alcohol if: Your health care provider tells you not to drink. You are pregnant, may be pregnant, or are planning to become pregnant. If you drink alcohol: Limit how much you have to: 0-1 drink a day. Know how much alcohol is in your drink. In the [place] one drink equals one 12 oz bottle of beer (355 mL), one 5 oz glass of wine (148 mL), or one 1 oz glass of hard liquor (44 mL). Lifestyle Do not use any products that contain nicotine or tobacco. These products include cigarettes, chewing  tobacco, and vaping devices, such as e-cigarettes. If you need help quitting, ask your health care provider. Do not use street drugs. Do not share needles. Ask your health care provider for help if you need support or information about quitting drugs. General instructions Schedule regular health, dental, and eye exams. Stay current with your vaccines. Tell your health care provider if: You often feel depressed. You have ever been abused or do not feel safe at home. Summary Adopting a healthy lifestyle and getting preventive care are important in promoting health and wellness. Follow your health care provider's instructions about healthy diet, exercising, and getting tested or screened for diseases. Follow your health care provider's instructions on monitoring your cholesterol and blood pressure. This information is not intended to replace advice given to you by your health care provider. Make sure you discuss any questions you have with your health care provider. Document Revised: 10/12/2020 Document Reviewed: 10/12/2020 Elsevier Patient Education  2022 [employer].  Our clinic's number is [phone]. Please call with questions or concerns about what we  discussed today.

## 2021-07-23 ENCOUNTER — Ambulatory Visit: Payer: Medicare Other | Admitting: Family Medicine

## 2021-07-30 ENCOUNTER — Ambulatory Visit: Payer: Medicare Other | Admitting: Family Medicine

## 2021-08-30 ENCOUNTER — Encounter: Payer: Self-pay | Admitting: Family Medicine

## 2021-08-30 ENCOUNTER — Ambulatory Visit (INDEPENDENT_AMBULATORY_CARE_PROVIDER_SITE_OTHER): Payer: Medicare Other | Admitting: Family Medicine

## 2021-08-30 ENCOUNTER — Other Ambulatory Visit: Payer: Self-pay

## 2021-08-30 VITALS — BP 149/101 | HR 93 | Wt 163.0 lb

## 2021-08-30 DIAGNOSIS — D863 Sarcoidosis of skin: Secondary | ICD-10-CM | POA: Diagnosis not present

## 2021-08-30 DIAGNOSIS — H4722 Hereditary optic atrophy: Secondary | ICD-10-CM

## 2021-08-30 DIAGNOSIS — I10 Essential (primary) hypertension: Secondary | ICD-10-CM | POA: Diagnosis not present

## 2021-08-30 DIAGNOSIS — Z23 Encounter for immunization: Secondary | ICD-10-CM

## 2021-08-30 DIAGNOSIS — M6740 Ganglion, unspecified site: Secondary | ICD-10-CM

## 2021-08-30 DIAGNOSIS — E785 Hyperlipidemia, unspecified: Secondary | ICD-10-CM

## 2021-08-30 MED ORDER — LOSARTAN POTASSIUM 50 MG PO TABS: 100.0000 mg | ORAL_TABLET | Freq: Every day | ORAL | 3 refills | Status: DC

## 2021-08-30 MED ORDER — SHINGRIX 50 MCG/0.5ML IM SUSR: 0.5000 mL | INTRAMUSCULAR | 0 refills | Status: AC

## 2021-08-30 NOTE — Assessment & Plan Note (Signed)
Referral to Ophthalmology

## 2021-08-30 NOTE — Progress Notes (Signed)
? ? ?  SUBJECTIVE:  ? ?CHIEF COMPLAINT: hypertension, wrist issue  ?HPI:  ? ?Allison Steele is a 51 y.o.  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 ? ?Patient reports this has been here several months.  Is not bothersome and is not painful.  Never turns red.  Does not bother her at work.  She works with her hands each day and does not have an issue with this.  She is right-hand dominant. ? ?Patient reports compliance with her blood pressure pills.  She denies headaches chest pains or difficulty breathing. ? ?Patient recently turned 74.  She is interested in the shingles vaccine.  [place] and reports had a pretty good time.  She has had intentional weight loss by cutting out sodas and juices.  She is no longer eating pork belly for breakfast.  She is lost a total of 11 pounds.  She denies any fevers, night sweats or lymphadenopathy ? ?PERTINENT  PMH / PSH/Family/Social History : Updated and reviewed as appropriate she is up-to-date on colorectal cancer screening and her mammogram is due in August.  She follows with gynecology for her Pap smear. ? ?OBJECTIVE:  ? ?BP (!) 149/101   Pulse 93   Wt 163 lb (73.9 kg)   LMP 04/26/2017   SpO2 100%   BMI 28.87 kg/m?   ?Today's weight:  ?Last Weight  Most recent update: 08/30/2021  8:23 AM  ? ? Weight  ?73.9 kg (163 lb)  ?      ? ?  ? ?Review of prior weights: ?Filed Weights  ? 08/30/21 0823  ?Weight: 163 lb (73.9 kg)  ? ? ? ?Cardiac: Regular rate and rhythm. Normal S1/S2. No murmurs, rubs, or gallops appreciated. ?Lungs: Clear bilaterally to ascultation.  ?Abdomen: Normoactive bowel sounds. No tenderness to deep or light palpation. No rebound or guarding.    ?Psych: Pleasant and appropriate  ?HEENT is unremarkable there is no lymphadenopathy. ? ? ?ASSESSMENT/PLAN:  ? ?Essential hypertension ?Not at goal.  Increase losartan to 100 mg.   May retrial thiazide diuretic in the future.  Repeat BMP in 2 weeks. ? ?Leber's hereditary optic neuropathy ?Referral to Ophthalmology.  ? ?Cutaneous sarcoidosis ?Follows with Dermatology. Changed Plaquenil dose recently. ?  ?Ganglion Cyst- L wrist, suspected, referral to Hand Surgery patient interested in options.  ? ?HCM ?Shingrix today ?Mammogram in August ? ?Follow up 2 weeks BMP and HTN  ? ? ?[name], MD  ?Family Medicine Teaching Service  ?[place]  ? ? ?

## 2021-08-30 NOTE — Assessment & Plan Note (Signed)
Follows with Dermatology. Changed Plaquenil dose recently. ?

## 2021-08-30 NOTE — Assessment & Plan Note (Signed)
Not at goal.  Increase losartan to 100 mg.  May retrial thiazide diuretic in the future.  Repeat BMP in 2 weeks. ?

## 2021-08-30 NOTE — Patient Instructions (Addendum)
It was wonderful to see you today. ? ?Please bring ALL of your medications with you to every visit.  ? ?Today we talked about: ? ?-Take TWO of your losartan (now 100 mg each night) ? ?Please schedule a visit in 2-3 years weeks for a blood pressure check  ? ?-going to the lab--I will call you with results and send a my chart ? ? ?I have referred you to [facility] to further evaluate your concern. If you do not received a phone call about this appointment within 2 weeks, please call our office back at [phone]. Jazmin Hartsell coordinates our referrals and can assist you in this.  ? ? ?I gave you a prescription for your Shingles vaccine  ? ? ?Thank you for choosing [facility].  ? ?Please call [phone] with any questions about today's appointment. ? ?Please be sure to schedule follow up at the front  desk before you leave today.  ? ?[name], MD  ?Family Medicine  ? ?

## 2021-08-31 LAB — LIPID PANEL
Chol/HDL Ratio: 2.5 ratio (ref 0.0–4.4)
Cholesterol, Total: 154 mg/dL (ref 100–199)
HDL: 61 mg/dL (ref >39)
LDL Chol Calc (NIH): 72 mg/dL (ref 0–99)
Triglycerides: 119 mg/dL (ref 0–149)
VLDL Cholesterol Cal: 21 mg/dL (ref 5–40)

## 2021-08-31 LAB — CBC
Hematocrit: 40.1 % (ref 34.0–46.6)
Hemoglobin: 13.6 g/dL (ref 11.1–15.9)
MCH: 33 pg (ref 26.6–33.0)
MCHC: 33.9 g/dL (ref 31.5–35.7)
MCV: 97 fL (ref 79–97)
Platelets: 286 10*3/uL (ref 150–450)
RBC: 4.12 x10E6/uL (ref 3.77–5.28)
RDW: 12.7 % (ref 11.7–15.4)
WBC: 6.7 10*3/uL (ref 3.4–10.8)

## 2021-08-31 LAB — HEMOGLOBIN A1C
Est. average glucose Bld gHb Est-mCnc: 108 mg/dL
Hgb A1c MFr Bld: 5.4 % (ref 4.8–5.6)

## 2021-08-31 LAB — BASIC METABOLIC PANEL
BUN/Creatinine Ratio: 12 (ref 9–23)
BUN: 12 mg/dL (ref 6–24)
CO2: 20 mmol/L (ref 20–29)
Calcium: 9.4 mg/dL (ref 8.7–10.2)
Chloride: 104 mmol/L (ref 96–106)
Creatinine, Ser: 1.04 mg/dL — ABNORMAL HIGH (ref 0.57–1.00)
Glucose: 91 mg/dL (ref 70–99)
Potassium: 4.1 mmol/L (ref 3.5–5.2)
Sodium: 140 mmol/L (ref 134–144)
eGFR: 65 mL/min/{1.73_m2} (ref >59)

## 2021-09-01 ENCOUNTER — Ambulatory Visit: Payer: Medicare Other | Admitting: Obstetrics & Gynecology

## 2021-09-14 ENCOUNTER — Ambulatory Visit (INDEPENDENT_AMBULATORY_CARE_PROVIDER_SITE_OTHER): Payer: Medicare Other | Admitting: Orthopedic Surgery

## 2021-09-14 ENCOUNTER — Encounter: Payer: Self-pay | Admitting: Orthopedic Surgery

## 2021-09-14 DIAGNOSIS — M67432 Ganglion, left wrist: Secondary | ICD-10-CM | POA: Diagnosis not present

## 2021-09-14 NOTE — Progress Notes (Signed)
? ?Office Visit Note ?  ?Patient: Allison Steele           ?Date of Birth: 30-Apr-1971           ?MRN: [id number] ?Visit Date: 09/14/2021 ?             ?Requested by: Martyn Malay, MD ?[address] ?[place],  [place] [address] ?PCP: Martyn Malay, MD ? ? ?Assessment & Plan: ?Visit Diagnoses:  ?1. Ganglion cyst of dorsum of left wrist   ? ? ?Plan: We discussed the diagnosis, prognosis, non-operative and operative treatment options for her dorsal wrist ganglion. ? ?After our discussion, the patient would like to proceed with cyst aspiration.  We reviewed the risks and benefits of conservative management including the possibility of cyst recurrence.  The patient expressed understanding of the reasoning and strategy going forward. ? ?All patient questions and concerns were addressed.  ? ? ?Follow-Up Instructions: No follow-ups on file.  ? ?Orders:  ?No orders of the defined types were placed in this encounter. ? ?No orders of the defined types were placed in this encounter. ? ? ? ? Procedures: ?Hand/UE Inj: L wrist for dorsal carpal ganglion on 09/14/2021 5:25 PM ?Indications: diagnostic and therapeutic ?Details: 18 G needle, dorsal approach ?Aspirate: 1.5 mL clear ?Outcome: tolerated well, no immediate complications ?Procedure, treatment alternatives, risks and benefits explained, specific risks discussed. Consent was given by the patient. Immediately prior to procedure a time out was called to verify the correct patient, procedure, equipment, support staff and site/side marked as required. Patient was prepped and draped in the usual sterile fashion.  ? ? ? ? ?Clinical Data: ?No additional findings. ? ? ?Subjective: ?Chief Complaint  ?Patient presents with  ? Left Wrist - Ganglion Cyst  ?  Onset x 2 monthds, no pain, RIGHT handed, sews for a living, has been doing an exercise class for 6 months doing pushups and burpees and she is not sure if they are related.  ? ? ?This is a 51 year old right-hand-dominant female  who works as a Regulatory affairs officer and presents with a mass on the dorsal radial aspect of the wrist.  She noticed this about 2 months ago.  It was noticed incidentally.  She thinks it may have started after she started a new exercise class.  The mass is not painful.  It may have slightly enlarged in size since it was first noticed.  It does not interfere with her daily activities.  She does not like the appearance of it. ? ? ?Review of Systems ? ? ?Objective: ?Vital Signs: BP (!) 157/91 (BP Location: Left Arm, Patient Position: Sitting)   Pulse 65   Ht '5\' 3"'$  (1.6 m)   Wt 163 lb (73.9 kg)   LMP 04/26/2017   BMI 28.87 kg/m?  ? ?Physical Exam ?Constitutional:   ?   Appearance: Normal appearance.  ?Cardiovascular:  ?   Rate and Rhythm: Normal rate.  ?   Pulses: Normal pulses.  ?Pulmonary:  ?   Effort: Pulmonary effort is normal.  ?Skin: ?   General: Skin is warm and dry.  ?   Capillary Refill: Capillary refill takes less than 2 seconds.  ?Neurological:  ?   Mental Status: She is alert.  ? ? ?Left Hand Exam  ? ?Tenderness  ?The patient is experiencing no tenderness.  ? ?Range of Motion  ?The patient has normal left wrist ROM. ? ?Other  ?Erythema: absent ?Sensation: normal ?Pulse: present ? ?Comments:  Approx 2  x 2 cm mass at dorsal radial wrist.  Mass is firm, round, well circumscribed.  Mass does transilluminate.  ? ? ? ? ?Specialty Comments:  ?No specialty comments available. ? ?Imaging: ?No results found. ? ? ?PMFS History: ?Patient Active Problem List  ? Diagnosis Date Noted  ? Ganglion cyst of dorsum of left wrist 09/14/2021  ? Cerumen impaction 03/25/2021  ? Healthcare maintenance 12/10/2020  ? Dyslipidemia 06/17/2019  ? History of nephrolithiasis 05/17/2018  ? Esophageal reflux 05/18/2014  ? DEPRESSION/ANXIETY 05/13/2009  ? 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 Essential hypertension 08/03/2006  ? ?Past Medical History:  ?Diagnosis Date  ? Anemia   ? 3 MONTHS AGO,  STARTED ON IRON PILLS  ? Depression   ? GERD (gastroesophageal reflux disease)   ? Headache   ? Hypertension   ? 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 Loss of central vision bilaterally.     ? Legally blind   ? Leber`s Optic Neuropathy - Dx at age 59, resulting in blindness, followed at [facility]  ? Nephrolithiasis   ? Sarcoidosis   ?  - followed by Dr. Delman Cheadle, [facility], [phone]  ?  ?Family History  ?Problem Relation Age of Onset  ? Hypertension Mother   ? Lung cancer Mother   ? Pneumonia Mother   ? Alcohol abuse Father   ? Lung cancer Father   ? Hypertension Father   ? Cystic fibrosis Sister   ? Diabetes Sister   ? Hypertension Sister   ? Diabetes Maternal Aunt   ? Diabetes Paternal Aunt   ? Diabetes Maternal Grandmother   ? Hypertension Son   ? Breast cancer Neg Hx   ? Colon cancer Neg Hx   ? Colon polyps Neg Hx   ? Esophageal cancer Neg Hx   ? Rectal cancer Neg Hx   ? Stomach cancer Neg Hx   ?  ?Past Surgical History:  ?Procedure Laterality Date  ? ANKLE FRACTURE SURGERY Left   ? CESAREAN SECTION    ? ROBOTIC ASSISTED TOTAL HYSTERECTOMY WITH SALPINGECTOMY Bilateral 05/10/2017  ? Procedure: ROBOTIC ASSISTED TOTAL HYSTERECTOMY WITH SALPINGECTOMY;  Surgeon: Princess Bruins, MD;  Location: [facility];  Service: Gynecology;  Laterality: Bilateral;  laparoscopic ? ?Request 7:30am OR start time in [facility] block time ? ?Requests 2 1/2 hours ?Dr. would like to use Fenestrated Bipolar (in cupboard Rm 7. Blue cord there also)  ? ?Social History  ? ?Occupational History  ? Occupation: [employer]  ?Tobacco Use  ? Smoking status: Never  ? Smokeless tobacco: Never  ?Vaping Use  ? Vaping Use: Never used  ?Substance and Sexual Activity  ? Alcohol use: Yes  ?  Comment: social  ? Drug use: No  ? Sexual activity: Not Currently  ? ? ? ? ? ? ?

## 2021-09-24 ENCOUNTER — Ambulatory Visit (INDEPENDENT_AMBULATORY_CARE_PROVIDER_SITE_OTHER): Payer: Medicare Other | Admitting: Family Medicine

## 2021-09-24 ENCOUNTER — Encounter: Payer: Self-pay | Admitting: Family Medicine

## 2021-09-24 VITALS — BP 113/70 | HR 72 | Wt 165.0 lb

## 2021-09-24 DIAGNOSIS — I10 Essential (primary) hypertension: Secondary | ICD-10-CM

## 2021-09-24 DIAGNOSIS — D863 Sarcoidosis of skin: Secondary | ICD-10-CM

## 2021-09-24 DIAGNOSIS — K219 Gastro-esophageal reflux disease without esophagitis: Secondary | ICD-10-CM

## 2021-09-24 DIAGNOSIS — H4722 Hereditary optic atrophy: Secondary | ICD-10-CM

## 2021-09-24 DIAGNOSIS — F32A Depression, unspecified: Secondary | ICD-10-CM

## 2021-09-24 MED ORDER — AMLODIPINE BESYLATE 5 MG PO TABS: 5.0000 mg | ORAL_TABLET | Freq: Every day | ORAL | 11 refills | Status: DC

## 2021-09-24 MED ORDER — LOSARTAN POTASSIUM 50 MG PO TABS: 100.0000 mg | ORAL_TABLET | Freq: Every day | ORAL | 11 refills | Status: DC

## 2021-09-24 MED ORDER — OMEPRAZOLE 20 MG PO CPDR: DELAYED_RELEASE_CAPSULE | ORAL | 11 refills | Status: DC

## 2021-09-24 MED ORDER — BUPROPION HCL ER (SR) 150 MG PO TB12: 150.0000 mg | ORAL_TABLET | Freq: Two times a day (BID) | ORAL | 11 refills | Status: DC

## 2021-09-24 MED ORDER — SPIRONOLACTONE 25 MG PO TABS: 25.0000 mg | ORAL_TABLET | Freq: Every day | ORAL | 11 refills | Status: DC

## 2021-09-24 NOTE — Assessment & Plan Note (Signed)
Due for follow-up in August ?

## 2021-09-24 NOTE — Assessment & Plan Note (Signed)
Continue current therapy.  BMP today.  She will monitor blood pressure over next week as she just recently started the increased dose of losartan.  Her blood pressures are good at home we will continue current therapy given her blood pressures at goal today. ?

## 2021-09-24 NOTE — Progress Notes (Signed)
? ? ?  SUBJECTIVE:  ? ?CHIEF COMPLAINT: BP follow up  ?HPI:  ? ?Allison Steele is a 51 y.o.  with history notable for Lebers hereditary optic neuropathy, cutaneous sarcoidosis and hypertension presenting for follow-up for blood pressure.  She was recently increased to 100 mg of losartan daily.  She started taking this about a week ago.  She reports some hesitancy and increasing her blood pressure pills.  She is compliant with spironolactone 25 and amlodipine.  She denies headaches, chest pain.  She is legally blind and does not have any change in her vision.  She denies dizziness or falls.  Home readings range from 1 40-1 50 over 70s to 80s.  She has some readings in 130s over 60s. ? ?She reports otherwise she is doing well.  She follows with gynecology for her Pap smear..  ? ?PERTINENT  PMH / PSH/Family/Social History : Updated reviewed as appropriate ? ?OBJECTIVE:  ? ?BP 113/70   Pulse 72   Wt 165 lb (74.8 kg)   LMP 04/26/2017   BMI 29.23 kg/m?   ?Today's weight:  ?Last Weight  Most recent update: 09/24/2021  8:33 AM  ? ? Weight  ?74.8 kg (165 lb)  ?      ? ?  ? ?Review of prior weights: ?Filed Weights  ? 09/24/21 0831  ?Weight: 165 lb (74.8 kg)  ? ? ?Pleasant appearing woman in no distress.  Regular rate and rhythm no murmurs rubs or gallops.  Lungs clear bilaterally to auscultation. ? ?ASSESSMENT/PLAN:  ? ?Essential hypertension ?Continue current therapy.  BMP today.  She will monitor blood pressure over next week as she just recently started the increased dose of losartan.  Her blood pressures are good at home we will continue current therapy given her blood pressures at goal today. ? ?Cutaneous sarcoidosis ?Due for follow-up in August ? ?Leber's hereditary optic neuropathy ?Follows with ophthalmology ? ?Esophageal reflux ?Doing well today ?  ?Medication cost issue patient needs 30-day supplies of medications.  The medications I prescribed this was sent to her pharmacy.  If the 90-day supplies are too  expensive. ? ? ? ?[name], MD  ?Family Medicine Teaching Service  ?[place]  ? ? ?

## 2021-09-24 NOTE — Assessment & Plan Note (Signed)
Follows with ophthalmology

## 2021-09-24 NOTE — Patient Instructions (Addendum)
It was wonderful to see you today. ? ?Please bring ALL of your medications with you to every visit.  ? ?Today we talked about: ? ?Please continue 2 of the losartan pills ? ?I will call you with blood work  ? ?Please keep a log of your blood pressures for the next week  ? ?We will make adjustments from there  ? ?I wrote you a note for work  ? ? ?Thank you for choosing [facility].  ? ?Please call [phone] with any questions about today's appointment. ? ?Please be sure to schedule follow up at the front  desk before you leave today.  ? ?[name], MD  ?Family Medicine  ? ?

## 2021-09-24 NOTE — Assessment & Plan Note (Signed)
Doing well today ?

## 2021-09-25 LAB — BASIC METABOLIC PANEL
BUN/Creatinine Ratio: 17 (ref 9–23)
BUN: 15 mg/dL (ref 6–24)
CO2: 25 mmol/L (ref 20–29)
Calcium: 10 mg/dL (ref 8.7–10.2)
Chloride: 104 mmol/L (ref 96–106)
Creatinine, Ser: 0.9 mg/dL (ref 0.57–1.00)
Glucose: 89 mg/dL (ref 70–99)
Potassium: 4.3 mmol/L (ref 3.5–5.2)
Sodium: 141 mmol/L (ref 134–144)
eGFR: 78 mL/min/{1.73_m2} (ref >59)

## 2021-09-28 ENCOUNTER — Telehealth: Payer: Self-pay | Admitting: Family Medicine

## 2021-09-28 NOTE — Telephone Encounter (Signed)
Called with normal results.  ? ?BP at home 115-121/70-80s. Continue current therapy (increased dose of ARB).  ? ?[name], MD  ?Family Medicine Teaching Service  ? ?

## 2021-10-04 ENCOUNTER — Other Ambulatory Visit: Payer: Self-pay | Admitting: Family Medicine

## 2021-10-04 DIAGNOSIS — Z1231 Encounter for screening mammogram for malignant neoplasm of breast: Secondary | ICD-10-CM

## 2021-10-16 ENCOUNTER — Emergency Department (HOSPITAL_BASED_OUTPATIENT_CLINIC_OR_DEPARTMENT_OTHER)
Admission: EM | Admit: 2021-10-16 | Discharge: 2021-10-16 | Disposition: A | Discharge status: Home / Self Care | Payer: Medicare Other | Attending: Emergency Medicine | Admitting: Emergency Medicine

## 2021-10-16 ENCOUNTER — Encounter (HOSPITAL_BASED_OUTPATIENT_CLINIC_OR_DEPARTMENT_OTHER): Payer: Self-pay

## 2021-10-16 DIAGNOSIS — K1379 Other lesions of oral mucosa: Secondary | ICD-10-CM | POA: Diagnosis not present

## 2021-10-16 DIAGNOSIS — M67432 Ganglion, left wrist: Secondary | ICD-10-CM | POA: Insufficient documentation

## 2021-10-16 DIAGNOSIS — M6740 Ganglion, unspecified site: Secondary | ICD-10-CM

## 2021-10-16 NOTE — ED Triage Notes (Signed)
She reports a known ganglion cyst at post left wrist, which is "more sore lately". She is in no distress. ?

## 2021-10-16 NOTE — Discharge Instructions (Addendum)
You need to follow-up with your dentist regarding that lesion in your cheek.  Follow-up with your orthopedist regarding your ganglion cyst.  Return to emergency room if you have any worsening symptoms. ?

## 2021-10-16 NOTE — ED Provider Notes (Signed)
?[facility] ?Provider Note ? ? ?CSN: [id number] ?Arrival date & time: 10/16/21  0825 ? ?  ? ?History ? ?Chief Complaint  ?Patient presents with  ? Wrist Pain  ? ? ?Allison Steele is a 51 y.o. female. ? ?Patient is a 51 year old female who presents with a cyst on her left wrist.  She has been diagnosed with a ganglion cyst.  She recently saw an orthopedist who drained it.  She says its become more enlarged.  No fevers.  No other injuries to her wrist. ? ?She also has a spot on her right gum which has been there about 3 to 4 weeks.  She saw her PCP for this and was told that look like she may have bit her gum.  She wants that checked out as well.  She says its not really sore.  She has not had any fevers.  No facial swelling.  No tooth pain.  She does have a dentist. ? ? ?  ? ?Home Medications ?Prior to Admission medications   ?Medication Sig Start Date End Date Taking? Authorizing Provider  ?amLODipine (NORVASC) 5 MG tablet Take 1 tablet (5 mg total) by mouth at bedtime. 09/24/21   Martyn Malay, MD  ?buPROPion [facility] SR) 150 MG 12 hr tablet Take 1 tablet (150 mg total) by mouth 2 (two) times daily. 09/24/21   Martyn Malay, MD  ?fluticasone Asencion Islam) 50 MCG/ACT nasal spray Place 2 sprays into both nostrils daily. 04/12/21   Martyn Malay, MD  ?hydroxychloroquine (PLAQUENIL) 200 MG tablet Take 1 tablet (200 mg total) by mouth 2 (two) times daily. ?Patient taking differently: Take 200 mg by mouth daily. 04/12/21   Martyn Malay, MD  ?lactulose (CHRONULAC) 10 GM/15ML solution Take 15 mLs (10 g total) by mouth daily as needed for mild constipation or moderate constipation. 08/10/20   Martyn Malay, MD  ?loratadine (CLARITIN) 10 MG tablet Take 1 tablet (10 mg total) by mouth daily. 04/12/21   Martyn Malay, MD  ?losartan (COZAAR) 50 MG tablet Take 2 tablets (100 mg total) by mouth at bedtime. 09/24/21   Martyn Malay, MD  ?melatonin 3 MG TABS tablet Take 1 tablet (3 mg total) by  mouth at bedtime as needed (for sleep). 03/05/21   Martyn Malay, MD  ?Multiple Vitamins-Minerals (ADULT ONE DAILY GUMMIES) CHEW Chew 1 tablet by mouth daily.    [provider]  ?omeprazole (PRILOSEC) 20 MG capsule TAKE 1 CAPSULE BY MOUTH EVERY DAY 09/24/21   Martyn Malay, MD  ?spironolactone (ALDACTONE) 25 MG tablet Take 1 tablet (25 mg total) by mouth daily. 09/24/21   Martyn Malay, MD  ?Zoster Vaccine Adjuvanted [facility]) injection Inject 0.5 mLs into the muscle every 2 (two) months for 2 doses. 08/30/21 10/30/21  Martyn Malay, MD  ?   ? ?Allergies    ?Citalopram hydrobromide   ? ?Review of Systems   ?Review of Systems  ?Constitutional:  Negative for fever.  ?HENT:  Negative for congestion, facial swelling and trouble swallowing.   ?Musculoskeletal:  Positive for arthralgias and joint swelling.  ?Skin:  Negative for color change and wound.  ?Neurological:  Negative for headaches.  ? ?Physical Exam ?Updated Vital Signs ?BP (!) 163/92 (BP Location: Right Arm)   Pulse 82   Temp 98.2 ?F (36.8 ?C) (Oral)   Resp 18   Ht '5\' 3"'$  (1.6 m)   Wt 73.9 kg   LMP 04/26/2017   SpO2  100%   BMI 28.87 kg/m?  ?Physical Exam ?HENT:  ?   Head: Normocephalic and atraumatic.  ?   Mouth/Throat:  ?   Mouth: Mucous membranes are moist.  ?   Comments: There is a small area of enlargement to her right lower inner cheek.  It is flesh-colored.  It is nontender.  There is no ulcerations.  No drainage.  No facial swelling.  No trismus.  No dental pain or swelling around the teeth. ?Cardiovascular:  ?   Rate and Rhythm: Normal rate.  ?Pulmonary:  ?   Effort: Pulmonary effort is normal.  ?   Breath sounds: Normal breath sounds.  ?Musculoskeletal:  ?   Comments: Patient has a 1 cm cyst to the dorsal surface of her left wrist.  It is mobile.  It is nontender.  There is no warmth or erythema.  No bony tenderness.  She is neurovascular intact distally.  ?Neurological:  ?   Mental Status: She is alert.  ? ? ?ED Results /  Procedures / Treatments   ?Labs ?(all labs ordered are listed, but only abnormal results are displayed) ?Labs Reviewed - No data to display ? ?EKG ?None ? ?Radiology ?No results found. ? ?Procedures ?Procedures  ? ? ?Medications Ordered in ED ?Medications - No data to display ? ?ED Course/ Medical Decision Making/ A&P ?  ?                        ?Medical Decision Making ? ?Patient presents with what appears to be a ganglion cyst on her left wrist.  There is no underlying bony tenderness.  It is very mobile.  I do not feel that imaging is indicated at this point.  There is no suggestions of infection.  Patient was advised that she will need to follow-up with her orthopedic surgeon regarding definitive management of this.  She was placed in a splint for symptomatic treatment. ? ?She has a flesh-colored patch on her inner gum.  There is no suggestions of infection.  It does not appear to be arising from the tooth.  She was encouraged to have close follow-up with her dentist regarding this. ? ?Final Clinical Impression(s) / ED Diagnoses ?Final diagnoses:  ?Ganglion cyst  ?Mouth sore  ? ? ?Rx / DC Orders ?ED Discharge Orders   ? ? None  ? ?  ? ? ?  ?Malvin Johns, MD ?10/16/21 1003 ? ?

## 2021-10-22 ENCOUNTER — Ambulatory Visit: Payer: Medicare Other | Admitting: Orthopedic Surgery

## 2021-10-27 ENCOUNTER — Ambulatory Visit: Payer: Medicare Other | Admitting: Obstetrics & Gynecology

## 2021-11-08 ENCOUNTER — Ambulatory Visit (INDEPENDENT_AMBULATORY_CARE_PROVIDER_SITE_OTHER): Payer: Medicare Other | Admitting: Orthopedic Surgery

## 2021-11-08 DIAGNOSIS — M67432 Ganglion, left wrist: Secondary | ICD-10-CM | POA: Diagnosis not present

## 2021-11-08 NOTE — Progress Notes (Signed)
Office Visit Note   Patient: Allison Steele           Date of Birth: 1970-07-01           MRN: [id number] Visit Date: 11/08/2021              Requested by: Martyn Malay, MD [place],  [place] [address] PCP: Martyn Malay, MD   Assessment & Plan: Visit Diagnoses:  1. Ganglion cyst of dorsum of left wrist     Plan: Patient's left dorsal ganglion cyst has recurred despite aspiration.  It remains bothersome for her and is interfering with her work.  We reviewed the risks of surgical excision including bleeding, infection, damage to neurovascular structures, incomplete symptom relief, and lesion recurrence.  After discussion, she elected proceed with surgical excision of the left dorsal ganglion.  The surgical date and time we confirm with the patient.  Follow-Up Instructions: No follow-ups on file.   Orders:  No orders of the defined types were placed in this encounter.  No orders of the defined types were placed in this encounter.     Procedures: No procedures performed   Clinical Data: No additional findings.   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  She was last seen on 09/14/2021 at which time the mass was aspirated.  It has since recurred and gotten bigger.  It is symptomatic for her.  It is interfering with her daily activities.  She denies any other lumps, bumps, or lesions in her hand.   Review of Systems   Objective: Vital Signs: LMP 04/26/2017   Physical Exam Constitutional:      Appearance: Normal appearance.  Cardiovascular:     Rate and Rhythm: Normal rate.     Pulses: Normal pulses.  Pulmonary:     Effort: Pulmonary effort is normal.  Skin:     General: Skin is warm and dry.     Capillary Refill: Capillary refill takes less than 2 seconds.  Neurological:     Mental Status: She is alert.    Left Hand Exam   Tenderness  The patient is experiencing no tenderness.   Other  Erythema: absent Sensation: normal Pulse: present  Comments:  Dorsal radial mass.  Approx 2.5 cm in diameter.  Mass is firm, round, well circumscribed, and transilluminates.      Specialty Comments:  No specialty comments available.  Imaging: No results found.   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 Loss of central vision bilaterally.  Legally blind    Leber`s Optic Neuropathy - Dx at age 51, resulting in blindness, followed at [facility], followed at [facility]   Nephrolithiasis    Sarcoidosis     - followed by Dr. Delman Cheadle, [facility], [phone]    Family History  Problem Relation Age of Onset   Hypertension Mother    Lung cancer Mother    Pneumonia Mother    Alcohol abuse Father    Lung cancer Father    Hypertension Father    Cystic fibrosis Sister    Diabetes Sister    Hypertension Sister    Diabetes Maternal Aunt    Diabetes Paternal Aunt    Diabetes Maternal Grandmother    Hypertension Son    Breast cancer Neg Hx    Colon cancer Neg Hx    Colon polyps Neg Hx    Esophageal cancer Neg Hx    Rectal cancer Neg Hx     Stomach cancer Neg Hx     Past Surgical History:  Procedure Laterality Date   ANKLE FRACTURE SURGERY Left    CESAREAN SECTION     ROBOTIC ASSISTED TOTAL HYSTERECTOMY WITH SALPINGECTOMY Bilateral 05/10/2017   Procedure: ROBOTIC ASSISTED TOTAL HYSTERECTOMY WITH SALPINGECTOMY;  Surgeon: Princess Bruins, MD;  Location: [facility];  Service: Gynecology;  Laterality: Bilateral;  laparoscopic  Request 7:30am OR start time in [facility] block time  Requests 2 1/2 hours Dr. would like to use Fenestrated Bipolar (in cupboard Rm 7. Blue cord there also)   Social History   Occupational History   Occupation: [employer]  Tobacco Use   Smoking status: Never   Smokeless tobacco: Never  Vaping Use   Vaping Use: Never used  Substance and Sexual Activity   Alcohol use: Yes    Comment: social   Drug use: No   Sexual activity: Not Currently

## 2021-11-09 ENCOUNTER — Ambulatory Visit: Payer: Medicare Other | Admitting: Pulmonary Disease

## 2021-11-09 ENCOUNTER — Encounter: Payer: Self-pay | Admitting: *Deleted

## 2021-11-16 ENCOUNTER — Ambulatory Visit: Payer: Medicare Other | Admitting: Pulmonary Disease

## 2021-11-16 ENCOUNTER — Other Ambulatory Visit: Payer: Self-pay | Admitting: Family Medicine

## 2021-11-16 ENCOUNTER — Encounter: Payer: Self-pay | Admitting: Radiology

## 2021-11-16 ENCOUNTER — Telehealth: Payer: Self-pay | Admitting: Orthopedic Surgery

## 2021-11-16 DIAGNOSIS — Z1231 Encounter for screening mammogram for malignant neoplasm of breast: Secondary | ICD-10-CM

## 2021-11-16 NOTE — Telephone Encounter (Signed)
I called and lmom advising patient of this information

## 2021-11-16 NOTE — Telephone Encounter (Signed)
Note faxed---please advise on rest of message

## 2021-11-16 NOTE — Telephone Encounter (Signed)
Patient requests a note to be faxed to work stating she is having surgery, surgery date (12/15/21), how long she will be out of work, and restrictions. Please fax to Attn. Jaquita Folds at [phone].  Patient also wants to know if there is anything she can buy, or use to help keep her hand/ wrist dry while bathing after surgery? Please call to advise.

## 2021-11-18 ENCOUNTER — Telehealth: Payer: Self-pay | Admitting: Orthopedic Surgery

## 2021-11-18 ENCOUNTER — Ambulatory Visit: Payer: Medicare Other | Admitting: Pulmonary Disease

## 2021-11-18 NOTE — Telephone Encounter (Signed)
[facility] (HR) called from [name] called requesting a letter be faxed. Pt is going out for surgery and they need letter stated estimated time out and if possible estimated return date back to work. Please fax letter to [phone]. [name] phone number is [phone].

## 2021-11-18 NOTE — Telephone Encounter (Signed)
Please see message below. Patient is scheduled for left dorsal ganglion cyst excision 12/15/2021

## 2021-11-22 NOTE — Telephone Encounter (Signed)
Work note has been created and faxed to HR

## 2021-11-29 ENCOUNTER — Encounter (HOSPITAL_BASED_OUTPATIENT_CLINIC_OR_DEPARTMENT_OTHER): Payer: Self-pay | Admitting: Orthopedic Surgery

## 2021-11-29 ENCOUNTER — Other Ambulatory Visit: Payer: Self-pay

## 2021-12-09 ENCOUNTER — Encounter (HOSPITAL_BASED_OUTPATIENT_CLINIC_OR_DEPARTMENT_OTHER)
Admission: RE | Admit: 2021-12-09 | Discharge: 2021-12-09 | Disposition: A | Discharge status: Home / Self Care | Payer: Medicare Other | Source: Ambulatory Visit | Attending: Orthopedic Surgery | Admitting: Orthopedic Surgery

## 2021-12-09 ENCOUNTER — Other Ambulatory Visit: Payer: Self-pay

## 2021-12-09 DIAGNOSIS — Z01818 Encounter for other preprocedural examination: Secondary | ICD-10-CM | POA: Insufficient documentation

## 2021-12-09 DIAGNOSIS — I10 Essential (primary) hypertension: Secondary | ICD-10-CM | POA: Diagnosis not present

## 2021-12-09 LAB — BASIC METABOLIC PANEL
Anion gap: 9 (ref 5–15)
BUN: 13 mg/dL (ref 6–20)
CO2: 25 mmol/L (ref 22–32)
Calcium: 9.2 mg/dL (ref 8.9–10.3)
Chloride: 104 mmol/L (ref 98–111)
Creatinine, Ser: 0.81 mg/dL (ref 0.44–1.00)
GFR, Estimated: >60 mL/min (ref >60)
Glucose, Bld: 117 mg/dL — ABNORMAL HIGH (ref 70–99)
Potassium: 3.7 mmol/L (ref 3.5–5.1)
Sodium: 138 mmol/L (ref 135–145)

## 2021-12-13 ENCOUNTER — Other Ambulatory Visit: Payer: Self-pay

## 2021-12-13 DIAGNOSIS — I10 Essential (primary) hypertension: Secondary | ICD-10-CM

## 2021-12-13 MED ORDER — LOSARTAN POTASSIUM 50 MG PO TABS
100.0000 mg | ORAL_TABLET | Freq: Every day | ORAL | 11 refills | Status: DC
Start: 2021-12-13 — End: 2022-03-11

## 2021-12-15 ENCOUNTER — Other Ambulatory Visit: Payer: Self-pay

## 2021-12-15 ENCOUNTER — Ambulatory Visit (HOSPITAL_BASED_OUTPATIENT_CLINIC_OR_DEPARTMENT_OTHER): Payer: Medicare Other | Admitting: Certified Registered"

## 2021-12-15 ENCOUNTER — Encounter (HOSPITAL_BASED_OUTPATIENT_CLINIC_OR_DEPARTMENT_OTHER): Payer: Self-pay | Admitting: Orthopedic Surgery

## 2021-12-15 ENCOUNTER — Encounter (HOSPITAL_BASED_OUTPATIENT_CLINIC_OR_DEPARTMENT_OTHER)
Admission: RE | Disposition: A | Discharge status: Home / Self Care | Payer: Self-pay | Source: Home / Self Care | Attending: Orthopedic Surgery

## 2021-12-15 ENCOUNTER — Ambulatory Visit (HOSPITAL_BASED_OUTPATIENT_CLINIC_OR_DEPARTMENT_OTHER)
Admission: RE | Admit: 2021-12-15 | Discharge: 2021-12-15 | Disposition: A | Discharge status: Home / Self Care | Payer: Medicare Other | Attending: Orthopedic Surgery | Admitting: Orthopedic Surgery

## 2021-12-15 DIAGNOSIS — I10 Essential (primary) hypertension: Secondary | ICD-10-CM | POA: Diagnosis not present

## 2021-12-15 DIAGNOSIS — K219 Gastro-esophageal reflux disease without esophagitis: Secondary | ICD-10-CM | POA: Diagnosis not present

## 2021-12-15 DIAGNOSIS — M67432 Ganglion, left wrist: Secondary | ICD-10-CM | POA: Diagnosis present

## 2021-12-15 DIAGNOSIS — H548 Legal blindness, as defined in USA: Secondary | ICD-10-CM | POA: Insufficient documentation

## 2021-12-15 DIAGNOSIS — I119 Hypertensive heart disease without heart failure: Secondary | ICD-10-CM | POA: Insufficient documentation

## 2021-12-15 DIAGNOSIS — F32A Depression, unspecified: Secondary | ICD-10-CM | POA: Insufficient documentation

## 2021-12-15 DIAGNOSIS — Z79899 Other long term (current) drug therapy: Secondary | ICD-10-CM | POA: Insufficient documentation

## 2021-12-15 HISTORY — PX: GANGLION CYST EXCISION: SHX1691

## 2021-12-15 SURGERY — EXCISION, GANGLION CYST, WRIST
Anesthesia: Monitor Anesthesia Care | Site: Wrist | Laterality: Left

## 2021-12-15 MED ORDER — ACETAMINOPHEN 10 MG/ML IV SOLN
1000.0000 mg | Freq: Once | INTRAVENOUS | Status: DC | PRN
  Administered 2021-12-15: 1000 mg via INTRAVENOUS

## 2021-12-15 MED ORDER — FENTANYL CITRATE (PF) 100 MCG/2ML IJ SOLN
INTRAMUSCULAR | Status: AC
  Filled 2021-12-15: qty 2

## 2021-12-15 MED ORDER — PROPOFOL 500 MG/50ML IV EMUL
INTRAVENOUS | Status: DC | PRN
  Administered 2021-12-15: 25 ug/kg/min via INTRAVENOUS

## 2021-12-15 MED ORDER — BUPIVACAINE HCL (PF) 0.25 % IJ SOLN
INTRAMUSCULAR | Status: DC | PRN
  Administered 2021-12-15: 10 mL

## 2021-12-15 MED ORDER — MIDAZOLAM HCL 2 MG/2ML IJ SOLN
INTRAMUSCULAR | Status: AC
  Filled 2021-12-15: qty 2

## 2021-12-15 MED ORDER — FENTANYL CITRATE (PF) 100 MCG/2ML IJ SOLN
25.0000 ug | INTRAMUSCULAR | Status: DC | PRN
  Administered 2021-12-15: 50 ug via INTRAVENOUS

## 2021-12-15 MED ORDER — LACTATED RINGERS IV SOLN: INTRAVENOUS | Status: DC

## 2021-12-15 MED ORDER — ONDANSETRON HCL 4 MG/2ML IJ SOLN
INTRAMUSCULAR | Status: DC | PRN
  Administered 2021-12-15: 4 mg via INTRAVENOUS

## 2021-12-15 MED ORDER — KETOROLAC TROMETHAMINE 30 MG/ML IJ SOLN: 30.0000 mg | Freq: Once | INTRAMUSCULAR | Status: DC

## 2021-12-15 MED ORDER — ACETAMINOPHEN 10 MG/ML IV SOLN
INTRAVENOUS | Status: AC
  Filled 2021-12-15: qty 100

## 2021-12-15 MED ORDER — LIDOCAINE HCL (CARDIAC) PF 100 MG/5ML IV SOSY
PREFILLED_SYRINGE | INTRAVENOUS | Status: DC | PRN
  Administered 2021-12-15: 50 mg via INTRAVENOUS
  Administered 2021-12-15: 60 mg via INTRAVENOUS

## 2021-12-15 MED ORDER — PROMETHAZINE HCL 25 MG/ML IJ SOLN: 6.2500 mg | INTRAMUSCULAR | Status: DC | PRN

## 2021-12-15 MED ORDER — OXYCODONE HCL 5 MG/5ML PO SOLN: 5.0000 mg | Freq: Once | ORAL | Status: DC | PRN

## 2021-12-15 MED ORDER — DEXAMETHASONE SODIUM PHOSPHATE 10 MG/ML IJ SOLN
INTRAMUSCULAR | Status: DC | PRN
  Administered 2021-12-15: 4 mg via INTRAVENOUS

## 2021-12-15 MED ORDER — 0.9 % SODIUM CHLORIDE (POUR BTL) OPTIME
TOPICAL | Status: DC | PRN
  Administered 2021-12-15: 200 mL

## 2021-12-15 MED ORDER — MIDAZOLAM HCL 5 MG/5ML IJ SOLN
INTRAMUSCULAR | Status: DC | PRN
  Administered 2021-12-15: 2 mg via INTRAVENOUS

## 2021-12-15 MED ORDER — OXYCODONE HCL 5 MG PO TABS: 5.0000 mg | ORAL_TABLET | Freq: Once | ORAL | Status: DC | PRN

## 2021-12-15 MED ORDER — OXYCODONE HCL 5 MG PO TABS: 5.0000 mg | ORAL_TABLET | Freq: Four times a day (QID) | ORAL | 0 refills | Status: AC | PRN

## 2021-12-15 MED ORDER — BUPIVACAINE HCL (PF) 0.25 % IJ SOLN
INTRAMUSCULAR | Status: AC
  Filled 2021-12-15: qty 30

## 2021-12-15 MED ORDER — FENTANYL CITRATE (PF) 100 MCG/2ML IJ SOLN
INTRAMUSCULAR | Status: DC | PRN
  Administered 2021-12-15: 50 ug via INTRAVENOUS

## 2021-12-15 MED ORDER — PROPOFOL 10 MG/ML IV BOLUS
INTRAVENOUS | Status: DC | PRN
  Administered 2021-12-15: 150 mg via INTRAVENOUS

## 2021-12-15 MED ORDER — AMISULPRIDE (ANTIEMETIC) 5 MG/2ML IV SOLN: 10.0000 mg | Freq: Once | INTRAVENOUS | Status: DC | PRN

## 2021-12-15 SURGICAL SUPPLY — 40 items
APL PRP STRL LF DISP 70% ISPRP (MISCELLANEOUS) ×1
BLADE SURG 15 STRL LF DISP TIS (BLADE) ×2 IMPLANT
BLADE SURG 15 STRL SS (BLADE) ×4
BNDG CMPR 9X4 STRL LF SNTH (GAUZE/BANDAGES/DRESSINGS) ×1
BNDG ELASTIC 3X5.8 VLCR STR LF (GAUZE/BANDAGES/DRESSINGS) ×3 IMPLANT
BNDG ESMARK 4X9 LF (GAUZE/BANDAGES/DRESSINGS) ×3 IMPLANT
BNDG GAUZE DERMACEA FLUFF (GAUZE/BANDAGES/DRESSINGS) ×1
BNDG GAUZE DERMACEA FLUFF 4 (GAUZE/BANDAGES/DRESSINGS) ×2 IMPLANT
BNDG GZE DERMACEA 4 6PLY (GAUZE/BANDAGES/DRESSINGS) ×1
BNDG PLASTER X FAST 3X3 WHT LF (CAST SUPPLIES) IMPLANT
BNDG PLSTR 9X3 FST ST WHT (CAST SUPPLIES)
CHLORAPREP W/TINT 26 (MISCELLANEOUS) ×3 IMPLANT
CORD BIPOLAR FORCEPS 12FT (ELECTRODE) ×3 IMPLANT
COVER BACK TABLE 60X90IN (DRAPES) ×3 IMPLANT
COVER MAYO STAND STRL (DRAPES) ×3 IMPLANT
CUFF TOURN SGL QUICK 18X4 (TOURNIQUET CUFF) ×1 IMPLANT
CUFF TOURN SGL QUICK 24 (TOURNIQUET CUFF)
CUFF TRNQT CYL 24X4X16.5-23 (TOURNIQUET CUFF) IMPLANT
DRAPE EXTREMITY T 121X128X90 (DISPOSABLE) ×3 IMPLANT
DRAPE SURG 17X23 STRL (DRAPES) ×3 IMPLANT
GAUZE XEROFORM 1X8 LF (GAUZE/BANDAGES/DRESSINGS) ×3 IMPLANT
GLOVE BIO SURGEON STRL SZ7 (GLOVE) ×3 IMPLANT
GLOVE BIOGEL PI IND STRL 7.0 (GLOVE) ×2 IMPLANT
GLOVE BIOGEL PI INDICATOR 7.0 (GLOVE) ×1
GOWN STRL REUS W/ TWL LRG LVL3 (GOWN DISPOSABLE) ×4 IMPLANT
GOWN STRL REUS W/TWL LRG LVL3 (GOWN DISPOSABLE) ×4
NDL HYPO 25X1 1.5 SAFETY (NEEDLE) IMPLANT
NEEDLE HYPO 25X1 1.5 SAFETY (NEEDLE) ×2 IMPLANT
NS IRRIG 1000ML POUR BTL (IV SOLUTION) ×3 IMPLANT
PACK BASIN DAY SURGERY FS (CUSTOM PROCEDURE TRAY) ×3 IMPLANT
PAD CAST 3X4 CTTN HI CHSV (CAST SUPPLIES) IMPLANT
PADDING CAST COTTON 3X4 STRL (CAST SUPPLIES)
SLEEVE SCD COMPRESS KNEE MED (STOCKING) ×1 IMPLANT
SUT ETHILON 4 0 PS 2 18 (SUTURE) ×3 IMPLANT
SUT MNCRL AB 3-0 PS2 18 (SUTURE) ×3 IMPLANT
SUT VICRYL 4-0 PS2 18IN ABS (SUTURE) IMPLANT
SYR BULB EAR ULCER 3OZ GRN STR (SYRINGE) ×3 IMPLANT
SYR CONTROL 10ML LL (SYRINGE) ×1 IMPLANT
TOWEL GREEN STERILE FF (TOWEL DISPOSABLE) ×6 IMPLANT
UNDERPAD 30X36 HEAVY ABSORB (UNDERPADS AND DIAPERS) ×3 IMPLANT

## 2021-12-15 NOTE — Transfer of Care (Signed)
Immediate Anesthesia Transfer of Care Note  Patient: Allison Steele  Procedure(s) Performed: LEFT DORSAL GANGLION CYST EXCISION (Left: Wrist)  Patient Location: PACU  Anesthesia Type:General  Level of Consciousness: drowsy and patient cooperative  Airway & Oxygen Therapy: Patient Spontanous Breathing and Patient connected to face mask oxygen  Post-op Assessment: Report given to RN and Post -op Vital signs reviewed and stable  Post vital signs: Reviewed and stable  Last Vitals:  Vitals Value Taken Time  BP    Temp    Pulse 63 12/15/21 0928  Resp 13 12/15/21 0928  SpO2 100 % 12/15/21 0928  Vitals shown include unvalidated device data.  Last Pain:  Vitals:   12/15/21 0725  TempSrc: Oral  PainSc: 0-No pain      Patients Stated Pain Goal: 6 (34/62/19 4712)  Complications: No notable events documented.

## 2021-12-15 NOTE — Anesthesia Postprocedure Evaluation (Signed)
Anesthesia Post Note  Patient: Allison Steele  Procedure(s) Performed: LEFT DORSAL GANGLION CYST EXCISION (Left: Wrist)     Patient location during evaluation: PACU Anesthesia Type: General Level of consciousness: awake Pain management: pain level controlled Vital Signs Assessment: post-procedure vital signs reviewed and stable Respiratory status: spontaneous breathing, nonlabored ventilation, respiratory function stable and patient connected to nasal cannula oxygen Cardiovascular status: blood pressure returned to baseline and stable Postop Assessment: no apparent nausea or vomiting Anesthetic complications: no   No notable events documented.  Last Vitals:  Vitals:   12/15/21 1000 12/15/21 1020  BP: 126/81 (!) 133/93  Pulse: 70 77  Resp: 16 18  Temp:  (!) 36.3 C  SpO2: 96% 98%    Last Pain:  Vitals:   12/15/21 1020  TempSrc: Oral  PainSc: 0-No pain                 Laurajean Hosek P Cauy Melody

## 2021-12-15 NOTE — Op Note (Signed)
   Date of Surgery: 12/15/2021  INDICATIONS: Patient is a 51 y.o.-year-old female with a left dorsal carpal ganglion cyst that has failed conservative management including aspiration.  Risks, benefits, and alternatives to surgery were again discussed with the patient in the preoperative area. The patient wishes to proceed with surgery.  Informed consent was signed after our discussion.   PREOPERATIVE DIAGNOSIS:  Left dorsal carpal ganglion cyst  POSTOPERATIVE DIAGNOSIS: Same.  PROCEDURE:  Left dorsal carpal ganglion cyst excision   SURGEON: Audria Nine, M.D.  ASSIST:   ANESTHESIA:  general  IV FLUIDS AND URINE: See anesthesia.  ESTIMATED BLOOD LOSS: 5 mL.  IMPLANTS: * No implants in log *   DRAINS: None  COMPLICATIONS: None  SPECIMENS: Cyst for permanent pathology  DESCRIPTION OF PROCEDURE: The patient was met in the preoperative holding area where the surgical site was marked and the consent form was verified.  The patient was then taken to the operating room and transferred to the operating table.  All bony prominences were well padded.  A tourniquet was applied to the left upper arm.  General endotracheal anesthesia  was induced.  The operative extremity was prepped and draped in the usual and sterile fashion.  A formal time-out was performed to confirm that this was the correct patient, surgery, side, and site.   Following timeout, the limb was gently exsanguinated with an Esmarch bandage and the tourniquet inflated to 250 mmHg.  A transverse incision was made centered over the mass.  The skin was incised.  Small crossing vessels were coagulated with the bipolar cautery.  Blunt dissection was used to identify the mass which was approximately 3 x 2-1/2 cm in size and was in between the second and fourth dorsal compartments.  Blunt dissection was used to dissect the mass circumferentially.  The mass was dissected deep towards the wrist capsule.  The tail appeared to be coming  from the dorsal capsule at approximately the scapholunate interval.  The tail was sharply excised along with a small portion of the dorsal capsule to prevent mass recurrence.  The specimen was passed off the back table for permanent pathology.  The wound was thoroughly irrigated with copious sterile saline.  The tourniquet was deflated.  Hemostasis was achieved with direct pressure and the wound and bipolar electrocautery.  The hand was warm and well-perfused with brisk capillary refill.  The wound was irrigated once more was closed using a 4-0 nylon suture in horizontal mattress fashion.  The wound was dressed with Xeroform, folded Kerlix, and cast padding.  The patient was then reversed from anesthesia and extubated uneventfully.  She was transferred to postoperative bed.  All counts were correct at the end the procedure.  The patient was then taken to PACU in stable condition.   POSTOPERATIVE PLAN: She will be discharged home with appropriate pain medication and discharge instructions.  I will see her back in the office in 10 to 14 days for her first postop visit.  Audria Nine, MD 9:27 AM

## 2021-12-15 NOTE — Anesthesia Preprocedure Evaluation (Addendum)
Anesthesia Evaluation  Patient identified by MRN, date of birth, ID band Patient awake    Reviewed: Allergy & Precautions, NPO status , Patient's Chart, lab work & pertinent test results  Airway Mallampati: II  TM Distance: >3 FB Neck ROM: Full    Dental no notable dental hx.    Pulmonary neg pulmonary ROS,    Pulmonary exam normal        Cardiovascular hypertension, Pt. on medications Normal cardiovascular exam     Neuro/Psych  Headaches, PSYCHIATRIC DISORDERS Depression Legally blind    GI/Hepatic Neg liver ROS, GERD  Medicated and Controlled,  Endo/Other  negative endocrine ROS  Renal/GU negative Renal ROS     Musculoskeletal negative musculoskeletal ROS (+)   Abdominal   Peds  Hematology negative hematology ROS (+)   Anesthesia Other Findings LEFT DORSAL CARPAL GANGLION  Reproductive/Obstetrics                             Anesthesia Physical Anesthesia Plan  ASA: 2  Anesthesia Plan: General   Post-op Pain Management:    Induction: Intravenous  PONV Risk Score and Plan: 3 and Ondansetron, Dexamethasone, Midazolam and Treatment may vary due to age or medical condition  Airway Management Planned: LMA  Additional Equipment:   Intra-op Plan:   Post-operative Plan: Extubation in OR  Informed Consent: I have reviewed the patients History and Physical, chart, labs and discussed the procedure including the risks, benefits and alternatives for the proposed anesthesia with the patient or authorized representative who has indicated his/her understanding and acceptance.     Dental advisory given  Plan Discussed with: CRNA  Anesthesia Plan Comments:        Anesthesia Quick Evaluation

## 2021-12-15 NOTE — Interval H&P Note (Signed)
History and Physical Interval Note:  12/15/2021 8:34 AM  Allison Steele  has presented today for surgery, with the diagnosis of LEFT DORSAL CARPAL GANGLION.  The various methods of treatment have been discussed with the patient and family. After consideration of risks, benefits and other options for treatment, the patient has consented to  Procedure(s): LEFT DORSAL GANGLION CYST EXCISION (Left) as a surgical intervention.  The patient's history has been reviewed, patient examined, no change in status, stable for surgery.  I have reviewed the patient's chart and labs.  Questions were answered to the patient's satisfaction.     Prentiss Polio Luciano Cinquemani

## 2021-12-15 NOTE — H&P (Signed)
HAND SURGERY     HPI:   This is a 51 year old right-handed female who works as a Regulatory affairs officer who presents for surgical excision of a left dorsal ganglion cyst.  She underwent mass aspiration on 09/14/21 but unfortunately the mass has returned.  It has become very symptomatic and is interfering with her daily activities.    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 Delman Cheadle, [facility], [phone]   Past Surgical History:  Procedure Laterality Date   ANKLE FRACTURE SURGERY Left    CESAREAN SECTION     ROBOTIC ASSISTED TOTAL HYSTERECTOMY WITH SALPINGECTOMY Bilateral 05/10/2017   Procedure: ROBOTIC ASSISTED TOTAL HYSTERECTOMY WITH SALPINGECTOMY;  Surgeon: Princess Bruins, MD;  Location: [facility];  Service: Gynecology;  Laterality: Bilateral;  laparoscopic  Request 7:30am OR start time in [place] [facility] block time  Requests 2 1/2 hours Dr. would like to use Fenestrated Bipolar (in cupboard Rm 7. Blue cord there also)   Social History   Socioeconomic History   Marital status: Divorced    Spouse name: Not on file   Number of children: 1   Years of education: 12   Highest education level: High school graduate  Occupational History   Occupation: Indutries of the [employer]  Tobacco Use   Smoking status: Never   Smokeless tobacco: Never  Vaping Use   Vaping Use: Never used  Substance and Sexual Activity   Alcohol use: Yes    Comment: social   Drug use: No   Sexual activity: Not Currently  Other Topics Concern   Not on file  Social  History Narrative   Patient lives alone in [place].   Patient has one son in the area.    Patient is legally blind and does not drive. Patient uses [employer] for transportation.    Patient exercises 3x per week for 1 hour.    Enjoys watching movies and reading.    Works at [employer] for [employer].   Social Determinants of Health   Financial Resource Strain: Low Risk  (07/13/2021)   Overall Financial Resource Strain (CARDIA)    Difficulty of Paying Living Expenses: Not hard at all  Food Insecurity: No Food Insecurity (07/13/2021)   Hunger Vital Sign    Worried About Running Out of Food in the Last Year: Never true    Ran Out of Food in the Last Year: Never true  Transportation Needs: No Transportation Needs (07/13/2021)   PRAPARE - Hydrologist (Medical): No    Lack of Transportation (Non-Medical): No  Physical Activity: Sufficiently Active (07/13/2021)   Exercise Vital Sign    Days of Exercise per Week: 3 days    Minutes of Exercise per Session: 60 min  Stress: Stress Concern Present (07/13/2021)   [place]    Feeling of Stress : To some extent  Social Connections: Socially Isolated (07/13/2021)   Social Connection and Isolation Panel [  NHANES]    Frequency of Communication with Friends and Family: More than three times a week    Frequency of Social Gatherings with Friends and Family: More than three times a week    Attends Religious Services: Never    Marine scientist or Organizations: No    Attends Music therapist: Never    Marital Status: Divorced   Family History  Problem Relation Age of Onset   Hypertension Mother    Lung cancer Mother    Pneumonia Mother    Alcohol abuse Father    Lung cancer Father    Hypertension Father    Cystic fibrosis Sister    Diabetes Sister    Hypertension Sister    Diabetes Maternal Aunt    Diabetes Paternal Aunt    Diabetes Maternal  Grandmother    Hypertension Son    Breast cancer Neg Hx    Colon cancer Neg Hx    Colon polyps Neg Hx    Esophageal cancer Neg Hx    Rectal cancer Neg Hx    Stomach cancer Neg Hx    - negative except otherwise stated in the family history section Allergies  Allergen Reactions   Citalopram Hydrobromide Other (See Comments)    Vivid dreams   Prior to Admission medications   Medication Sig Start Date End Date Taking? Authorizing Provider  amLODipine (NORVASC) 5 MG tablet Take 1 tablet (5 mg total) by mouth at bedtime. 09/24/21  Yes Martyn Malay, MD  buPROPion [facility] SR) 150 MG 12 hr tablet Take 1 tablet (150 mg total) by mouth 2 (two) times daily. 09/24/21  Yes Martyn Malay, MD  fluticasone (FLONASE) 50 MCG/ACT nasal spray Place 2 sprays into both nostrils daily. 04/12/21  Yes Martyn Malay, MD  hydroxychloroquine (PLAQUENIL) 200 MG tablet Take 1 tablet (200 mg total) by mouth 2 (two) times daily. Patient taking differently: Take 200 mg by mouth daily. 04/12/21  Yes Martyn Malay, MD  loratadine (CLARITIN) 10 MG tablet Take 1 tablet (10 mg total) by mouth daily. 04/12/21  Yes Martyn Malay, MD  losartan (COZAAR) 50 MG tablet Take 2 tablets (100 mg total) by mouth at bedtime. 12/13/21  Yes Martyn Malay, MD  Multiple Vitamins-Minerals (ADULT ONE DAILY GUMMIES) CHEW Chew 1 tablet by mouth daily.   Yes [provider]  omeprazole (PRILOSEC) 20 MG capsule TAKE 1 CAPSULE BY MOUTH EVERY DAY 09/24/21  Yes Martyn Malay, MD  spironolactone (ALDACTONE) 25 MG tablet Take 1 tablet (25 mg total) by mouth daily. 09/24/21  Yes Martyn Malay, MD  lactulose (CHRONULAC) 10 GM/15ML solution Take 15 mLs (10 g total) by mouth daily as needed for mild constipation or moderate constipation. 08/10/20   Martyn Malay, MD   No results found. - pertinent xrays, CT, MRI studies were reviewed and independently interpreted  Positive ROS: All other systems have been reviewed and were otherwise  negative with the exception of those mentioned in the HPI and as above.  Physical Exam: General: No acute distress, resting comfortably Cardiovascular: No pedal edema Respiratory: No cyanosis, no use of accessory musculature Skin: No lesions in the area of chief complaint Neurologic: Sensation intact distally Psychiatric: Patient is at baseline mood and affect  Left Hand; Approximately 2.5 x 2.5 cm mass at dorsal radial wrist Mass is firm, round, mobile, and transilluminates SILT throughout hand Skin warm and dry     Assessment: 51 yo F w/ left  dorsal ganglion cyst   Plan: OR this morning for cyst excision We again reviewed the risks of surgery including bleeding, infection, damage to blood vessels and nerves, mass recurrence, delayed wound healing, need for additional procedures Pt to be discharged to home from [place], M.D. [facility] 8:31 AM

## 2021-12-15 NOTE — Discharge Instructions (Addendum)
 Allison Steele, M.D. Hand Surgery  POST-OPERATIVE DISCHARGE INSTRUCTIONS   PRESCRIPTIONS: - You have been given a prescription to be taken as directed for post-operative pain control.  You may also take over the counter ibuprofen/aleve and tylenol for pain. Take this as directed on the packaging. Do not exceed 3000 mg tylenol/acetaminophen in 24 hours.  Ibuprofen 600-800 mg (3-4) tablets by mouth every 6 hours as needed for pain.   OR  Aleve 2 tablets by mouth every 12 hours (twice daily) as needed for pain.   AND/OR  Tylenol 1000 mg (2 tablets) every 8 hours as needed for pain.  - Please use your pain medication carefully, as refills are limited and you may not be provided with one.  As stated above, please use over the counter pain medicine - it will also be helpful with decreasing your swelling.    ANESTHESIA: -After your surgery, post-surgical discomfort or pain is likely. This discomfort can last several days to a few weeks. At certain times of the day your discomfort may be more intense.   Did you receive a nerve block?   - A nerve block can provide pain relief for one hour to two days after your surgery. As long as the nerve block is working, you will experience little or no sensation in the area the surgeon operated on.  - As the nerve block wears off, you will begin to experience pain or discomfort. It is very important that you begin taking your prescribed pain medication before the nerve block fully wears off. Treating your pain at the first sign of the block wearing off will ensure your pain is better controlled and more tolerable when full-sensation returns. Do not wait until the pain is intolerable, as the medicine will be less effective. It is better to treat pain in advance than to try and catch up.   General Anesthesia:  If you did not receive a nerve block during your surgery, you will need to start taking your pain medication shortly after your surgery and  should continue to do so as prescribed by your surgeon.     ICE AND ELEVATION: - You may use ice for the first 48-72 hours, but it is not critical.   - Motion of your fingers is very important to decrease the swelling.  - Elevation, as much as possible for the next 48 hours, is critical for decreasing swelling as well as for pain relief. Elevation means when you are seated or lying down, you hand should be at or above your heart. When walking, the hand needs to be at or above the level of your elbow.  - If the bandage gets too tight, it may need to be loosened. Please contact our office and we will instruct you in how to do this.    SURGICAL BANDAGES:  - Keep your dressing and/or splint clean and dry at all times.  You can remove your dressing 4 days from now and change with a dry dressing or Band-Aids as needed thereafter. - You may place a plastic bag over your bandage to shower, but be careful, do not get your bandages wet.  - After the bandages have been removed, it is OK to get the stitches wet in a shower or with hand washing. Do Not soak or submerge the wound yet. Please do not use lotions or creams on the stitches.      HAND THERAPY:  - You may not need any. If you do,   we will begin this at your follow up visit in the clinic.    ACTIVITY AND WORK: - You are encouraged to move any fingers which are not in the bandage.  - Light use of the fingers is allowed to assist the other hand with daily hygiene and eating, but strong gripping or lifting is often uncomfortable and should be avoided.  - You might miss a variable period of time from work and hopefully this issue has been discussed prior to surgery. You may not do any heavy work with your affected hand for about 2 weeks.    [facility] [address] [place],  [place]  [address] [phone]    Post Anesthesia Home Care Instructions  Activity: Get plenty of rest for the remainder of the day. A  responsible individual must stay with you for 24 hours following the procedure.  For the next 24 hours, DO NOT: -Drive a car -Paediatric nurse -Drink alcoholic beverages -Take any medication unless instructed by your physician -Make any legal decisions or sign important papers.  Meals: Start with liquid foods such as gelatin or soup. Progress to regular foods as tolerated. Avoid greasy, spicy, heavy foods. If nausea and/or vomiting occur, drink only clear liquids until the nausea and/or vomiting subsides. Call your physician if vomiting continues.  Special Instructions/Symptoms: Your throat may feel dry or sore from the anesthesia or the breathing tube placed in your throat during surgery. If this causes discomfort, gargle with warm salt water. The discomfort should disappear within 24 hours.  Tylenol given today at 9:50am 12/15/21

## 2021-12-15 NOTE — Anesthesia Procedure Notes (Signed)
Procedure Name: LMA Insertion Date/Time: 12/15/2021 7:37 AM  Performed by: Geniya Fulgham, Ernesta Amble, CRNAPre-anesthesia Checklist: Patient identified, Emergency Drugs available, Suction available and Patient being monitored Patient Re-evaluated:Patient Re-evaluated prior to induction Oxygen Delivery Method: Circle system utilized Preoxygenation: Pre-oxygenation with 100% oxygen Induction Type: IV induction Ventilation: Mask ventilation without difficulty LMA: LMA inserted LMA Size: 4.0 Number of attempts: 1 Airway Equipment and Method: Bite block Placement Confirmation: positive ETCO2 Tube secured with: Tape Dental Injury: Teeth and Oropharynx as per pre-operative assessment

## 2021-12-16 ENCOUNTER — Encounter (HOSPITAL_BASED_OUTPATIENT_CLINIC_OR_DEPARTMENT_OTHER): Payer: Self-pay | Admitting: Orthopedic Surgery

## 2021-12-16 LAB — SURGICAL PATHOLOGY

## 2021-12-20 ENCOUNTER — Other Ambulatory Visit: Payer: Self-pay | Admitting: *Deleted

## 2021-12-20 DIAGNOSIS — F32A Depression, unspecified: Secondary | ICD-10-CM

## 2021-12-20 MED ORDER — BUPROPION HCL ER (SR) 150 MG PO TB12: 150.0000 mg | ORAL_TABLET | Freq: Two times a day (BID) | ORAL | 11 refills | Status: DC

## 2021-12-21 ENCOUNTER — Encounter: Payer: Self-pay | Admitting: Pulmonary Disease

## 2021-12-21 ENCOUNTER — Ambulatory Visit: Payer: Medicare Other | Admitting: Pulmonary Disease

## 2021-12-21 VITALS — BP 132/76 | HR 68 | Temp 98.1°F | Ht 63.0 in | Wt 168.6 lb

## 2021-12-21 DIAGNOSIS — D869 Sarcoidosis, unspecified: Secondary | ICD-10-CM

## 2021-12-21 DIAGNOSIS — R0609 Other forms of dyspnea: Secondary | ICD-10-CM

## 2021-12-21 LAB — C-REACTIVE PROTEIN: CRP: <1 mg/dL (ref 0.5–20.0)

## 2021-12-21 LAB — SEDIMENTATION RATE: Sed Rate: 3 mm/hr (ref 0–30)

## 2021-12-21 NOTE — Patient Instructions (Signed)
Order pulmonary function test  Order CT scan of the chest with contrast  Order ESR, CRP, ACE level    I will see you back in about 6 weeks   call with any other significant symptoms

## 2021-12-21 NOTE — Progress Notes (Signed)
Allison Steele    [id number]    1970/07/05  Primary Care Physician:Brown, Raiford Simmonds, MD  Referring Physician: Martyn Malay, MD [place],  [place] [address]  Chief complaint:   Patient with a history of sarcoidosis  HPI: Has been having some shortness of breath recently  Felt she may be having a rash on her face related to sarcoidosis  She was recently switched to a reduced dose of Plaquenil Plaquenil was controlling the skin lesions previously  Sarcoidosis was diagnosed following a skin biopsy over 6 years ago  Denies any chest pain or chest pressure  Continues to follow-up with dermatology  Never smoker No occupational predisposition to lung disease No family history of asthma  Sarcoidosis was diagnosed following biopsy of a skin lesion about 5 years ago Has been on Plaquenil with control of the skin lesions Denies any history of respiratory complaints No history of heart disease No history of kidney disease Skin lesions are well controlled at present  She has a history of lebers optic neuropathy, 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   buPROPion (WELLBUTRIN SR) 150 MG 12 hr tablet Take 1 tablet (150 mg total) by mouth 2 (two) times daily.   fluticasone (FLONASE) 50 MCG/ACT nasal spray Place 2 sprays into both nostrils daily.   hydroxychloroquine (PLAQUENIL) 200 MG tablet Take 1 tablet (200 mg total) by mouth 2 (two) times daily. (Patient taking differently: Take 200 mg by mouth daily.)   lactulose (CHRONULAC) 10 GM/15ML solution Take 15 mLs (10 g total) by mouth daily as needed for mild constipation or moderate constipation.   loratadine (CLARITIN) 10 MG tablet Take 1 tablet (10 mg total) by mouth daily.   losartan (COZAAR) 50 MG tablet Take 2 tablets (100 mg total) by mouth at bedtime.   Multiple  Vitamins-Minerals (ADULT ONE DAILY GUMMIES) CHEW Chew 1 tablet by mouth daily.   omeprazole (PRILOSEC) 20 MG capsule TAKE 1 CAPSULE BY MOUTH EVERY DAY   spironolactone (ALDACTONE) 25 MG tablet Take 1 tablet (25 mg total) by mouth daily.   No facility-administered encounter medications on file as of 12/21/2021.    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 Loss of central vision bilaterally.      Legally blind    Leber`s Optic Neuropathy - Dx at age 89, resulting in blindness, followed at [facility]   Nephrolithiasis    Sarcoidosis     - followed by Dr. Delman Cheadle, [facility], [phone]    Past Surgical History:  Procedure Laterality Date   ANKLE FRACTURE SURGERY Left    CESAREAN SECTION     GANGLION CYST EXCISION Left 12/15/2021   Procedure: LEFT DORSAL GANGLION CYST EXCISION;  Surgeon: Sherilyn Cooter, MD;  Location: [facility];  Service: Orthopedics;  Laterality: Left;   ROBOTIC ASSISTED TOTAL HYSTERECTOMY WITH SALPINGECTOMY Bilateral 05/10/2017  Procedure: ROBOTIC ASSISTED TOTAL HYSTERECTOMY WITH SALPINGECTOMY;  Surgeon: Princess Bruins, MD;  Location: [facility];  Service: Gynecology;  Laterality: Bilateral;  laparoscopic  Request 7:30am OR start time in [place] Gyn block time  Requests 2 1/2 hours Dr. would like to use Fenestrated Bipolar (in cupboard Rm 7. Blue cord there also)    Family History  Problem Relation Age of Onset   Hypertension Mother    Lung cancer Mother    Pneumonia Mother    Alcohol abuse Father    Lung cancer Father    Hypertension Father    Cystic  fibrosis Sister    Diabetes Sister    Hypertension Sister    Diabetes Maternal Aunt    Diabetes Paternal Aunt    Diabetes Maternal Grandmother    Hypertension Son    Breast cancer Neg Hx    Colon cancer Neg Hx    Colon polyps Neg Hx    Esophageal cancer Neg Hx    Rectal cancer Neg Hx    Stomach cancer Neg Hx     Social History   Socioeconomic History   Marital status: Divorced    Spouse name: Not on file   Number of children: 1   Years of education: 12   Highest education level: High school graduate  Occupational History   Occupation: [employer] of [employer]  Tobacco Use   Smoking status: Never   Smokeless tobacco: Never  Vaping Use   Vaping Use: Never used  Substance and Sexual Activity   Alcohol use: Yes    Comment: social   Drug use: No   Sexual activity: Not Currently  Other Topics Concern   Not on file  Social History Narrative   Patient lives alone in [place].   Patient has one son in the area.    Patient is legally blind and does not drive. Patient uses [employer] for transportation.    Patient exercises 3x per week for 1 hour.    Enjoys watching movies and reading.    Works at [employer] for [employer].   Social Determinants of Health   Financial Resource Strain: Low Risk  (07/13/2021)   Overall Financial Resource Strain (CARDIA)    Difficulty of Paying Living Expenses: Not hard at all  Food Insecurity: No Food Insecurity (07/13/2021)   Hunger Vital Sign    Worried About Running Out of Food in the Last Year: Never true    Ran Out of Food in the Last Year: Never true  Transportation Needs: No Transportation Needs (07/13/2021)   PRAPARE - Hydrologist (Medical): No    Lack of Transportation (Non-Medical): No  Physical Activity: Sufficiently Active (07/13/2021)   Exercise Vital Sign    Days of Exercise per Week: 3 days    Minutes of Exercise per Session: 60 min  Stress: Stress Concern Present (07/13/2021)   [place]    Feeling of Stress : To some extent  Social Connections: Socially Isolated (07/13/2021)   Social Connection and Isolation Panel [NHANES]    Frequency of Communication with Friends and Family: More than three times a week    Frequency of Social Gatherings with Friends and Family: More than three times a week    Attends Religious Services: Never    Marine scientist or Organizations: No    Attends Archivist Meetings: Never    Marital Status: Divorced  Human resources officer Violence: Not At  Risk (07/13/2021)   Humiliation, Afraid, Rape, and Kick questionnaire    Fear of Current or Ex-Partner: No    Emotionally Abused: No    Physically Abused: No    Sexually Abused: No    Review of Systems  Constitutional:  Negative for activity change.  HENT: Negative.    Eyes:  Positive for visual disturbance.  Respiratory: Negative.  Negative for apnea, cough, choking, chest tightness, shortness of breath, wheezing and stridor.   Cardiovascular:  Negative for chest pain and palpitations.  Gastrointestinal: Negative.   All other systems reviewed and are negative.   Vitals:   12/21/21 1356  BP: 132/76  Pulse: 68  Temp: 98.1 F (36.7 C)  SpO2: 100%     Physical Exam Constitutional:      Appearance: Normal appearance. She is well-developed.  HENT:     Head: Normocephalic.  Eyes:     General:        Right eye: No discharge.        Left eye: No discharge.  Neck:     Thyroid: No thyromegaly.     Trachea: No tracheal deviation.  Cardiovascular:     Rate and Rhythm: Normal rate and regular rhythm.     Heart sounds: Normal heart sounds.  Pulmonary:     Effort: Pulmonary effort is normal. No respiratory distress.     Breath sounds: No stridor. No wheezing or rhonchi.  Musculoskeletal:     Cervical back: No rigidity or tenderness.  Skin:    General: Skin is warm and dry.  Neurological:     Mental Status: She is alert.   Psychiatric:        Behavior: Behavior normal.    Chest x-ray 01/18/2018-reviewed by myself showing no acute infiltrate CT scan of the chest reviewed showing calcified adenopathy, small lung nodules-no evidence of scarring  PFT on 07/04/2018 reveals no obstruction, no significant bronchodilator response, normal lung volumes  10/02/2020 BUN/creatinine of 14/0.8  Assessment:  Patient with a history of skin sarcoidosis -Has been stable -  May be having a mild flare some shortness of breath recently and concern for sarcoidosis -Did review previous PFT and CT scan that did not reveal any evidence of significant lung disease -CT did not have significant adenopathy or scarring -PFT about 3 years ago was completely normal   Plan/Recommendations:  No previous evidence of sarcoidosis affecting the lungs  Obtain a CT scan of the chest with contrast  Obtain repeat pulmonary function test  Obtain CRP, ESR, ACE level  Follow-up in about 6 weeks    Sherrilyn Rist MD [facility] 12/21/2021, 2:37 PM  CC: Martyn Malay, MD

## 2021-12-22 LAB — ANGIOTENSIN CONVERTING ENZYME: Angiotensin-Converting Enzyme: 16 U/L (ref 9–67)

## 2021-12-24 ENCOUNTER — Encounter: Payer: Self-pay | Admitting: Pulmonary Disease

## 2021-12-27 ENCOUNTER — Ambulatory Visit (INDEPENDENT_AMBULATORY_CARE_PROVIDER_SITE_OTHER): Payer: Medicare Other | Admitting: Orthopedic Surgery

## 2021-12-27 DIAGNOSIS — M67432 Ganglion, left wrist: Secondary | ICD-10-CM

## 2021-12-27 NOTE — Progress Notes (Signed)
Post-Op Visit Note   Patient: Allison Steele           Date of Birth: [birth year]           MRN: [id number] Visit Date: 12/27/2021 PCP: Martyn Malay, MD   Assessment & Plan:  Chief Complaint:  Chief Complaint  Patient presents with   Left Wrist - Routine Post Op   Visit Diagnoses:  1. Ganglion cyst of dorsum of left wrist     Plan: Patient just under 2 weeks s/p left dorsal carpal ganglion cyst excision.  She is doing well postoperatively.  The incision is clean, dry, and well approximated.  The nylon sutures were removed.  She has full and painless ROM of her fingers.  She can return to work but recommend no heavy lifting with left hand for another two weeks.  I can see her back again as needed.   Follow-Up Instructions: No follow-ups on file.   Orders:  No orders of the defined types were placed in this encounter.  No orders of the defined types were placed in this encounter.   Imaging: No results found.  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 Loss of central vision bilaterally.      Legally blind    Leber`s Optic Neuropathy - Dx at age 18, resulting in blindness, followed at [facility]   Nephrolithiasis    Sarcoidosis     - followed by Dr. Delman Cheadle, [facility], [phone]    Family History  Problem Relation Age of Onset    Hypertension Mother    Lung cancer Mother    Pneumonia Mother    Alcohol abuse Father    Lung cancer Father    Hypertension Father    Cystic fibrosis Sister    Diabetes Sister    Hypertension Sister    Diabetes Maternal Aunt    Diabetes Paternal Aunt    Diabetes Maternal Grandmother    Hypertension Son    Breast cancer Neg Hx    Colon cancer Neg Hx    Colon polyps Neg Hx    Esophageal cancer Neg Hx    Rectal cancer Neg Hx    Stomach cancer Neg Hx     Past Surgical History:  Procedure Laterality Date   ANKLE FRACTURE SURGERY Left    CESAREAN SECTION     GANGLION CYST EXCISION Left 12/15/2021   Procedure: LEFT DORSAL GANGLION CYST EXCISION;  Surgeon: Sherilyn Cooter, MD;  Location: [facility];  Service: Orthopedics;  Laterality: Left;   ROBOTIC ASSISTED TOTAL HYSTERECTOMY WITH SALPINGECTOMY Bilateral 05/10/2017   Procedure: ROBOTIC ASSISTED TOTAL HYSTERECTOMY WITH SALPINGECTOMY;  Surgeon: Princess Bruins, MD;  Location: [facility];  Service: Gynecology;  Laterality: Bilateral;  laparoscopic  Request 7:30am OR start time in [place] Gyn block time  Requests 2 1/2 hours Dr. would like to use Fenestrated  Bipolar (in cupboard Rm 7. Blue cord there also)   Social History   Occupational History   Occupation: [employer]  Tobacco Use   Smoking status: Never   Smokeless tobacco: Never  Vaping Use   Vaping Use: Never used  Substance and Sexual Activity   Alcohol use: Yes    Comment: social   Drug use: No   Sexual activity: Not Currently

## 2021-12-30 ENCOUNTER — Encounter: Payer: Self-pay | Admitting: Obstetrics & Gynecology

## 2021-12-30 ENCOUNTER — Ambulatory Visit (INDEPENDENT_AMBULATORY_CARE_PROVIDER_SITE_OTHER): Payer: Medicare Other | Admitting: Obstetrics & Gynecology

## 2021-12-30 ENCOUNTER — Other Ambulatory Visit (HOSPITAL_COMMUNITY)
Admission: RE | Admit: 2021-12-30 | Discharge: 2021-12-30 | Disposition: A | Discharge status: Home / Self Care | Payer: Medicare Other | Source: Ambulatory Visit | Attending: Obstetrics & Gynecology | Admitting: Obstetrics & Gynecology

## 2021-12-30 VITALS — BP 150/92 | HR 77 | Ht 63.0 in | Wt 169.0 lb

## 2021-12-30 DIAGNOSIS — Z01419 Encounter for gynecological examination (general) (routine) without abnormal findings: Secondary | ICD-10-CM

## 2021-12-30 DIAGNOSIS — Z9189 Other specified personal risk factors, not elsewhere classified: Secondary | ICD-10-CM

## 2021-12-30 DIAGNOSIS — Z90710 Acquired absence of both cervix and uterus: Secondary | ICD-10-CM | POA: Diagnosis not present

## 2021-12-30 NOTE — Progress Notes (Signed)
Allison Steele 29-Apr-1971 [id number]   History:    51 y.o.G1P1L1 Single   RP:  Established patient presenting for annual gyn exam    HPI: S/P Robotic TLH/Bilateral Salpingectomy 05/10/2017.  No pelvic pain.  No Sx of menopause.  Sexually active, using condoms. Pap Neg 06/2018.  Pap reflex today.  Urine/BMs wnl. Breasts wnl. Mammo 01/2021 Neg, next Mammo scheduled 01/2022.  BMI at 29.94.  Exercising at home regularly.  [place] 11/2020.  Health labs with Fam MD.  Had a cyst removed at the left wrist.   Past medical history,surgical history, family history and social history were all reviewed and documented in the EPIC chart.  Gynecologic History Patient's last menstrual period was 04/26/2017.  Obstetric History OB History  Gravida Para Term Preterm AB Living  '1 1 1     1  '$ SAB IAB Ectopic Multiple Live Births               # Outcome Date GA Lbr Len/2nd Weight Sex Delivery Anes PTL Lv  1 Term              ROS: A ROS was performed and pertinent positives and negatives are included in the history. GENERAL: No fevers or chills. HEENT: No change in vision, no earache, sore throat or sinus congestion. NECK: No pain or stiffness. CARDIOVASCULAR: No chest pain or pressure. No palpitations. PULMONARY: No shortness of breath, cough or wheeze. GASTROINTESTINAL: No abdominal pain, nausea, vomiting or diarrhea, melena or bright red blood per rectum. GENITOURINARY: No urinary frequency, urgency, hesitancy or dysuria. MUSCULOSKELETAL: No joint or muscle pain, no back pain, no recent trauma. DERMATOLOGIC: No rash, no itching, no lesions. ENDOCRINE: No polyuria, polydipsia, no heat or cold intolerance. No recent change in weight. HEMATOLOGICAL: No anemia or easy bruising or bleeding. NEUROLOGIC: No headache, seizures, numbness, tingling or weakness. PSYCHIATRIC: No depression, no loss of interest in normal activity or change in sleep pattern.     Exam:   BP (!) 150/92   Pulse 77   Ht '5\' 3"'$  (1.6 m)    Wt 169 lb (76.7 kg)   LMP 04/26/2017   SpO2 99%   BMI 29.94 kg/m   Body mass index is 29.94 kg/m.  General appearance : Well developed well nourished female. No acute distress HEENT: Eyes: no retinal hemorrhage or exudates,  Neck supple, trachea midline, no carotid bruits, no thyroidmegaly Lungs: Clear to auscultation, no rhonchi or wheezes, or rib retractions  Heart: Regular rate and rhythm, no murmurs or gallops Breast:Examined in sitting and supine position were symmetrical in appearance, no palpable masses or tenderness,  no skin retraction, no nipple inversion, no nipple discharge, no skin discoloration, no axillary or supraclavicular lymphadenopathy Abdomen: no palpable masses or tenderness, no rebound or guarding Extremities: no edema or skin discoloration or tenderness  Pelvic: Vulva: Normal             Vagina: No gross lesions or discharge.  Pap reflex done.  Cervix/Uterus absent  Adnexa  Without masses or tenderness  Anus: Normal   Assessment/Plan:  51 y.o. female for annual exam   1. Encounter for routine gynecological examination with Papanicolaou smear of cervix S/P Robotic TLH/Bilateral Salpingectomy 05/10/2017.  No pelvic pain.  No Sx of menopause.  Sexually active, using condoms. Pap Neg 06/2018.  Pap reflex today.  Urine/BMs wnl. Breasts wnl. Mammo 01/2021 Neg, next Mammo scheduled 01/2022.  BMI at 29.94.  Exercising at home regularly.  [place] 11/2020.  Health  labs with Fam MD.  Had a cyst removed at the left wrist. - Cytology - PAP( [facility])  2. S/P total hysterectomy  3. Other specified personal risk factors, not elsewhere classified   [name], 3:54 PM 12/30/2021

## 2022-01-03 LAB — CYTOLOGY - PAP: Diagnosis: NEGATIVE

## 2022-01-04 ENCOUNTER — Other Ambulatory Visit: Payer: Self-pay | Admitting: *Deleted

## 2022-01-04 MED ORDER — TINIDAZOLE 500 MG PO TABS: ORAL_TABLET | ORAL | 0 refills | Status: DC

## 2022-01-05 ENCOUNTER — Ambulatory Visit (HOSPITAL_COMMUNITY)
Admission: RE | Admit: 2022-01-05 | Discharge: 2022-01-05 | Disposition: A | Discharge status: Home / Self Care | Payer: Medicare Other | Source: Ambulatory Visit | Attending: Pulmonary Disease | Admitting: Pulmonary Disease

## 2022-01-05 DIAGNOSIS — D869 Sarcoidosis, unspecified: Secondary | ICD-10-CM | POA: Insufficient documentation

## 2022-01-05 DIAGNOSIS — R0609 Other forms of dyspnea: Secondary | ICD-10-CM | POA: Insufficient documentation

## 2022-01-05 MED ORDER — IOHEXOL 300 MG/ML  SOLN
75.0000 mL | Freq: Once | INTRAMUSCULAR | Status: AC | PRN
  Administered 2022-01-05: 75 mL via INTRAVENOUS

## 2022-01-05 MED ORDER — SODIUM CHLORIDE (PF) 0.9 % IJ SOLN
INTRAMUSCULAR | Status: AC
  Filled 2022-01-05: qty 50

## 2022-01-26 ENCOUNTER — Ambulatory Visit: Payer: Medicare Other

## 2022-02-11 ENCOUNTER — Ambulatory Visit: Payer: Medicare Other

## 2022-02-18 ENCOUNTER — Ambulatory Visit: Payer: Medicare Other

## 2022-02-28 ENCOUNTER — Ambulatory Visit (INDEPENDENT_AMBULATORY_CARE_PROVIDER_SITE_OTHER): Payer: Medicare Other | Admitting: Pulmonary Disease

## 2022-02-28 ENCOUNTER — Encounter: Payer: Self-pay | Admitting: Pulmonary Disease

## 2022-02-28 VITALS — BP 126/80 | HR 82 | Temp 98.4°F | Ht 63.0 in | Wt 171.6 lb

## 2022-02-28 DIAGNOSIS — D869 Sarcoidosis, unspecified: Secondary | ICD-10-CM

## 2022-02-28 DIAGNOSIS — R0609 Other forms of dyspnea: Secondary | ICD-10-CM

## 2022-02-28 LAB — PULMONARY FUNCTION TEST
DL/VA % pred: 132 %
DL/VA: 5.73 ml/min/mmHg/L
DLCO cor % pred: 117 %
DLCO cor: 23.95 ml/min/mmHg
DLCO unc % pred: 117 %
DLCO unc: 23.95 ml/min/mmHg
FEF 25-75 Post: 3.3 L/sec
FEF 25-75 Pre: 3 L/sec
FEF2575-%Change-Post: 9 %
FEF2575-%Pred-Post: 122 %
FEF2575-%Pred-Pre: 111 %
FEV1-%Change-Post: 0 %
FEV1-%Pred-Post: 90 %
FEV1-%Pred-Pre: 89 %
FEV1-Post: 2.46 L
FEV1-Pre: 2.43 L
FEV1FVC-%Change-Post: 7 %
FEV1FVC-%Pred-Pre: 103 %
FEV6-%Change-Post: -6 %
FEV6-%Pred-Post: 82 %
FEV6-%Pred-Pre: 87 %
FEV6-Post: 2.75 L
FEV6-Pre: 2.92 L
FEV6FVC-%Pred-Post: 102 %
FEV6FVC-%Pred-Pre: 102 %
FVC-%Change-Post: -6 %
FVC-%Pred-Post: 80 %
FVC-%Pred-Pre: 85 %
FVC-Post: 2.75 L
FVC-Pre: 2.92 L
Post FEV1/FVC ratio: 89 %
Post FEV6/FVC ratio: 100 %
Pre FEV1/FVC ratio: 83 %
Pre FEV6/FVC Ratio: 100 %
RV % pred: 91 %
RV: 1.58 L
TLC % pred: 87 %
TLC: 4.28 L

## 2022-02-28 NOTE — Patient Instructions (Signed)
History of sarcoidosis  PFT within normal limits  CT scan of the chest with calcified adenopathy  Follow-up in a year

## 2022-02-28 NOTE — Progress Notes (Signed)
PFT done today. 

## 2022-02-28 NOTE — Progress Notes (Signed)
Allison Steele    [id number]    Apr 08, 1971  Primary Care Physician:Brown, Raiford Simmonds, MD  Referring Physician: Martyn Malay, MD [place],  [place] [id number]  Chief complaint:   Patient with a history of sarcoidosis  HPI: Breathing has been relatively stable  Follows up with dermatology for facial rash felt related to sarcoidosis  Felt she may be having a rash on her face related to sarcoidosis  Continues on Plaquenil  Sarcoidosis was diagnosed following a skin biopsy over 6 years ago  Denies any chest pain or chest pressure  Continues to follow-up with dermatology  Never smoker No occupational predisposition to lung disease No family history of asthma  Sarcoidosis was diagnosed following biopsy of a skin lesion about 5 years ago Has been on Plaquenil with control of the skin lesions Denies any history of respiratory complaints No history of heart disease No history of kidney disease Skin lesions are well controlled at present  She has a history of lebers optic neuropathy, lost her vision at age 20    Outpatient Encounter Medications as of 02/28/2022  Medication Sig   amLODipine (NORVASC) 5 MG tablet Take 1 tablet (5 mg total) by mouth at bedtime.   buPROPion (WELLBUTRIN SR) 150 MG 12 hr tablet Take 1 tablet (150 mg total) by mouth 2 (two) times daily.   hydroxychloroquine (PLAQUENIL) 200 MG tablet Take 1 tablet (200 mg total) by mouth 2 (two) times daily. (Patient taking differently: Take 200 mg by mouth daily.)   losartan (COZAAR) 50 MG tablet Take 2 tablets (100 mg total) by mouth at bedtime.   omeprazole (PRILOSEC) 20 MG capsule TAKE 1 CAPSULE BY MOUTH EVERY DAY   spironolactone (ALDACTONE) 25 MG tablet Take 1 tablet (25 mg total) by mouth daily.   tinidazole (TINDAMAX) 500 MG tablet Take 2 tablets by mouth twice daily for 2 days.   No facility-administered encounter medications on file as of 02/28/2022.    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 Loss of central vision bilaterally.      Legally blind    Leber`s Optic Neuropathy - Dx at age 59, resulting in blindness, followed at [facility]   Nephrolithiasis    Sarcoidosis     - followed by Dr. Delman Cheadle, [facility], [phone]    Past Surgical History:  Procedure Laterality Date   ANKLE FRACTURE SURGERY Left    CESAREAN SECTION     GANGLION CYST EXCISION Left 12/15/2021   Procedure: LEFT DORSAL GANGLION CYST EXCISION;  Surgeon: Sherilyn [place], MD;  Location: [facility];  Service: Orthopedics;  Laterality: Left;   ROBOTIC ASSISTED TOTAL HYSTERECTOMY WITH SALPINGECTOMY Bilateral 05/10/2017   Procedure: ROBOTIC ASSISTED TOTAL HYSTERECTOMY WITH SALPINGECTOMY;  Surgeon: Princess Bruins, MD;  Location: [facility];  Service: Gynecology;  Laterality: Bilateral;  laparoscopic  Request 7:30am OR start time in [place] [facility] block time  Requests 2 1/2 hours Dr. would like to use Fenestrated Bipolar (in cupboard Rm 7. Blue cord there also)  Family History  Problem Relation Age of Onset   Hypertension Mother    Lung cancer Mother    Pneumonia Mother    Alcohol abuse Father    Lung cancer Father    Hypertension Father    Cystic fibrosis Sister    Diabetes Sister    Hypertension Sister    Diabetes Maternal Aunt    Diabetes Paternal Aunt    Diabetes Maternal Grandmother    Hypertension Son    Colon polyps Neg Hx    Esophageal cancer Neg Hx    Rectal cancer Neg Hx    Stomach cancer Neg Hx     Social History   Socioeconomic History   Marital status: Divorced     Spouse name: Not on file   Number of children: 1   Years of education: 12   Highest education level: High school graduate  Occupational History   Occupation: [employer]  Tobacco Use   Smoking status: Never   Smokeless tobacco: Never  Vaping Use   Vaping Use: Never used  Substance and Sexual Activity   Alcohol use: Yes    Comment: social   Drug use: No   Sexual activity: Yes    Partners: Male    Birth control/protection: Surgical, Condom    Comment: hysterectomy, younger than 16, partners 5  Other Topics Concern   Not on file  Social History Narrative   Patient lives alone in [place].   Patient has one son in the area.    Patient is legally blind and does not drive. Patient uses [employer] for transportation.    Patient exercises 3x per week for 1 hour.    Enjoys watching movies and reading.    Works at [employer] for [employer].   Social Determinants of Health   Financial Resource Strain: Low Risk  (07/13/2021)   Overall Financial Resource Strain (CARDIA)    Difficulty of Paying Living Expenses: Not hard at all  Food Insecurity: No Food Insecurity (07/13/2021)   Hunger Vital Sign    Worried About Running Out of Food in the Last Year: Never true    Ran Out of Food in the Last Year: Never true  Transportation Needs: No Transportation Needs (07/13/2021)   PRAPARE - Hydrologist (Medical): No    Lack of Transportation (Non-Medical): No  Physical Activity: Sufficiently Active (07/13/2021)   Exercise Vital Sign    Days of Exercise per Week: 3 days    Minutes of Exercise per Session: 60 min  Stress: Stress Concern Present (07/13/2021)   [place]    Feeling of Stress : To some extent  Social Connections: Socially Isolated (07/13/2021)   Social Connection and Isolation Panel [NHANES]    Frequency of Communication with Friends and Family: More than three times a week    Frequency  of Social Gatherings with Friends and Family: More than three times a week    Attends Religious Services: Never    Marine scientist or Organizations: No    Attends Archivist Meetings: Never    Marital Status: Divorced  Human resources officer Violence: Not At Risk (07/13/2021)   Humiliation, Afraid, Rape, and Kick questionnaire    Fear of Current or Ex-Partner: No    Emotionally Abused: No    Physically Abused: No    Sexually Abused: No    Review of Systems  Constitutional:  Negative for activity change.  HENT: Negative.    Eyes:  Positive for visual disturbance.  Respiratory: Negative.  Negative for apnea, cough, choking, chest tightness, shortness of breath, wheezing and stridor.   Cardiovascular:  Negative for chest pain and palpitations.  Gastrointestinal: Negative.   All other systems reviewed and are negative.   Vitals:   02/28/22 1555  BP: 126/80  Pulse: 82  Temp: 98.4 F (36.9 C)  SpO2: 99%     Physical Exam Constitutional:      Appearance: Normal appearance. She is well-developed.  HENT:     Head: Normocephalic.  Eyes:     General:        Right eye: No discharge.        Left eye: No discharge.  Neck:     Thyroid: No thyromegaly.     Trachea: No tracheal deviation.  Cardiovascular:     Rate and Rhythm: Normal rate and regular rhythm.     Heart sounds: Normal heart sounds.  Pulmonary:     Effort: Pulmonary effort is normal. No respiratory distress.     Breath sounds: No stridor. No wheezing or rhonchi.  Musculoskeletal:     Cervical back: No rigidity or tenderness.  Skin:    General: Skin is warm and dry.  Neurological:     Mental Status: She is alert.  Psychiatric:        Mood and Affect: Mood normal.        Behavior: Behavior normal.    Chest x-ray 01/18/2018-reviewed by myself showing no acute infiltrate CT scan of the chest reviewed showing calcified adenopathy, small lung nodules-no evidence of scarring  PFT on 07/04/2018 reveals no  obstruction, no significant bronchodilator response, normal lung volumes  10/02/2020 BUN/creatinine of 14/0.8  PFT today is within normal limits  Most recent CT scan of the chest is 01/05/2022-stable pulmonary nodules and calcified adenopathy  Assessment:  History of sarcoidosis of the skin -Has been stable -On Plaquenil  Breathing has been stable -PFT is normal -CT scan is stable compared to 3 years ago    Plan/Recommendations:  No changes at present  I will follow-up a year from now  Encouraged to call with significant concerns  Will continue to follow with dermatology    Sherrilyn Rist MD [facility] 02/28/2022, 3:59 PM  CC: Martyn Malay, MD

## 2022-03-09 ENCOUNTER — Ambulatory Visit
Admission: RE | Admit: 2022-03-09 | Discharge: 2022-03-09 | Disposition: A | Discharge status: Home / Self Care | Payer: Medicare Other | Source: Ambulatory Visit | Attending: Family Medicine | Admitting: Family Medicine

## 2022-03-09 DIAGNOSIS — Z1231 Encounter for screening mammogram for malignant neoplasm of breast: Secondary | ICD-10-CM

## 2022-03-10 ENCOUNTER — Encounter: Payer: Self-pay | Admitting: Family Medicine

## 2022-03-11 ENCOUNTER — Ambulatory Visit: Payer: Medicare Other | Admitting: Family Medicine

## 2022-03-11 ENCOUNTER — Encounter: Payer: Self-pay | Admitting: Family Medicine

## 2022-03-11 ENCOUNTER — Other Ambulatory Visit: Payer: Self-pay | Admitting: *Deleted

## 2022-03-11 VITALS — BP 139/85 | HR 87 | Temp 98.6°F | Wt 170.2 lb

## 2022-03-11 DIAGNOSIS — I10 Essential (primary) hypertension: Secondary | ICD-10-CM | POA: Diagnosis not present

## 2022-03-11 DIAGNOSIS — H4722 Hereditary optic atrophy: Secondary | ICD-10-CM | POA: Diagnosis not present

## 2022-03-11 DIAGNOSIS — D863 Sarcoidosis of skin: Secondary | ICD-10-CM

## 2022-03-11 DIAGNOSIS — Z23 Encounter for immunization: Secondary | ICD-10-CM | POA: Diagnosis not present

## 2022-03-11 MED ORDER — LOSARTAN POTASSIUM 50 MG PO TABS: 100.0000 mg | ORAL_TABLET | Freq: Every day | ORAL | 11 refills | Status: DC

## 2022-03-11 NOTE — Assessment & Plan Note (Addendum)
BMP today. BP at goal. Continue medications

## 2022-03-11 NOTE — Assessment & Plan Note (Signed)
She is going to call to schedule her Eye Exam.

## 2022-03-11 NOTE — Assessment & Plan Note (Signed)
She will call if she is taken off of Plaquenil--will still need yearly monitoring.

## 2022-03-11 NOTE — Patient Instructions (Signed)
It was wonderful to see you today.  Please bring ALL of your medications with you to every visit.   Today we talked about:  - Getting back to the gym  - Checking blood work--I will call you with results    Please follow up in 6 months   Thank you for choosing [facility].   Please call [phone] with any questions about today's appointment.  Please be sure to schedule follow up at the front  desk before you leave today.   [name], MD  Family Medicine

## 2022-03-11 NOTE — Progress Notes (Signed)
    SUBJECTIVE:   CHIEF COMPLAINT: BP check  HPI:   Allison Steele is a 51 y.o.  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  She reports overall she is doing well she has the following concerns today: BP control.  She is taking her losartan 100 mg, spironolactone, HCTZ, and amlodipine. No falls, dizziness, chest pain, headaches.  Cutaneous sarcoidosis: Dermatologist wants to take her off of her Plaquenil. She is worried about this. She has had no lung complications or extracutaneous manifestations of her sarcoidosis.   She is pleased her L ganglion cyst removal. She has not had recurrence.   PERTINENT  PMH / PSH/Family/Social History : Updated and reviewed.  OBJECTIVE:   BP 139/85   Pulse 87   Temp 98.6 F (37 C)   Wt 170 lb 3.2 oz (77.2 kg)   LMP 04/26/2017   SpO2 100%   BMI 30.15 kg/m   Today's weight:  Last Weight  Most recent update: 03/11/2022  8:27 AM    Weight  77.2 kg (170 lb 3.2 oz)            Review of prior weights: Filed Weights   03/11/22 0827  Weight: 170 lb 3.2 oz (77.2 kg)     Cardiac: Regular rate and rhythm. Normal S1/S2. No murmurs, rubs, or gallops appreciated. Lungs: Clear bilaterally to ascultation.  Abdomen: Normoactive bowel sounds. No tenderness to deep or light palpation. No rebound or guarding.   Psych: Pleasant and appropriate    ASSESSMENT/PLAN:   Essential hypertension BMP today. BP at goal. Continue medications   Cutaneous sarcoidosis She will call if she is taken off of Plaquenil--will still need yearly monitoring.   Leber's hereditary optic neuropathy She is going to call to schedule her Eye Exam.    HCM Flu given today  Follow up in March 2024     [name], Allison Steele

## 2022-03-12 LAB — BASIC METABOLIC PANEL
BUN/Creatinine Ratio: 13 (ref 9–23)
BUN: 12 mg/dL (ref 6–24)
CO2: 23 mmol/L (ref 20–29)
Calcium: 9.5 mg/dL (ref 8.7–10.2)
Chloride: 101 mmol/L (ref 96–106)
Creatinine, Ser: 0.89 mg/dL (ref 0.57–1.00)
Glucose: 111 mg/dL — ABNORMAL HIGH (ref 70–99)
Potassium: 3.6 mmol/L (ref 3.5–5.2)
Sodium: 140 mmol/L (ref 134–144)
eGFR: 79 mL/min/{1.73_m2} (ref >59)

## 2022-03-16 ENCOUNTER — Telehealth: Payer: Self-pay | Admitting: Family Medicine

## 2022-03-16 NOTE — Telephone Encounter (Signed)
Called with results---nonfasting results normal.  Repeat A1C in spring.   [name], MD  Family Medicine Teaching Service

## 2022-04-23 ENCOUNTER — Ambulatory Visit (HOSPITAL_COMMUNITY)
Admission: EM | Admit: 2022-04-23 | Discharge: 2022-04-23 | Disposition: A | Discharge status: Home / Self Care | Payer: Medicare Other | Attending: Family Medicine | Admitting: Family Medicine

## 2022-04-23 ENCOUNTER — Other Ambulatory Visit: Payer: Self-pay

## 2022-04-23 ENCOUNTER — Encounter (HOSPITAL_COMMUNITY): Payer: Self-pay | Admitting: *Deleted

## 2022-04-23 DIAGNOSIS — R300 Dysuria: Secondary | ICD-10-CM | POA: Diagnosis present

## 2022-04-23 DIAGNOSIS — R319 Hematuria, unspecified: Secondary | ICD-10-CM | POA: Diagnosis not present

## 2022-04-23 LAB — POCT URINALYSIS DIPSTICK, ED / UC
Bilirubin Urine: NEGATIVE
Glucose, UA: NEGATIVE mg/dL
Ketones, ur: NEGATIVE mg/dL
Leukocytes,Ua: NEGATIVE
Nitrite: NEGATIVE
Protein, ur: NEGATIVE mg/dL
Specific Gravity, Urine: 1.02 (ref 1.005–1.030)
Urobilinogen, UA: 0.2 mg/dL (ref 0.0–1.0)
pH: 6.5 (ref 5.0–8.0)

## 2022-04-23 NOTE — Discharge Instructions (Signed)
Your urine had a little bit of blood but was otherwise normal.  We are sending this off for culture and we will contact you if we need to start antibiotics.  We have also ordered a vaginal swab I will contact you if we need to arrange any treatment based on this result.  Since you have had blood in your urine I do recommend you follow-up with urology; call to schedule an appointment.  Make sure you are drinking plenty of fluid.  If you develop any additional symptoms including abdominal pain, fever, nausea, vomiting you need to be seen immediately.

## 2022-04-23 NOTE — ED Provider Notes (Signed)
[facility]    CSN: [id number] Arrival date & time: 04/23/22  1536      History   Chief Complaint Chief Complaint  Patient presents with   Hematuria   Dysuria    HPI Allison Steele is a 51 y.o. female.   Patient presents today with a weeklong history of malodorous urine, discolored urine, urinary frequency, dysuria.  Denies any pelvic symptoms including vaginal discharge, pelvic pain, vaginal odor.  She denies any abdominal pain, fever, nausea, vomiting.  She has tried Azo and probiotics without improvement of symptoms.  Denies history of diabetes and does not take SGLT2 inhibitor.  Denies any recent urogenital procedures or self-catheterization.  She has not seen a urologist.  She does have a history of kidney stones but has not had any of these recently.  Reports that current symptoms are similar to previous episodes of this condition.    Past Medical History:  Diagnosis Date   Anemia    3 MONTHS AGO, STARTED ON IRON PILLS   Depression    GERD (gastroesophageal reflux disease)    Headache    Hypertension    Leber's hereditary optic neuropathy 08/26/2008   Leber's hereditary optic neuropathy - diagnosed at age 37. Loss of central vision bilaterally.      Legally blind    Leber`s Optic Neuropathy - Dx at age 70, resulting in blindness, followed at [facility]   Nephrolithiasis    Sarcoidosis     - followed by Dr. Delman Cheadle, [facility], [phone]    Patient Active Problem List   Diagnosis Date Noted   Ganglion cyst of dorsum of left wrist 09/14/2021   Healthcare maintenance 12/10/2020   History of nephrolithiasis 05/17/2018   Esophageal reflux 05/18/2014   DEPRESSION/ANXIETY 05/13/2009   Cutaneous sarcoidosis 01/22/2009   Leber's hereditary optic neuropathy 08/26/2008   Essential hypertension 08/03/2006    Past Surgical History:  Procedure Laterality Date   ANKLE FRACTURE SURGERY Left    CESAREAN SECTION     GANGLION CYST EXCISION Left  12/15/2021   Procedure: LEFT DORSAL GANGLION CYST EXCISION;  Surgeon: Sherilyn [place], MD;  Location: [facility];  Service: Orthopedics;  Laterality: Left;   ROBOTIC ASSISTED TOTAL HYSTERECTOMY WITH SALPINGECTOMY Bilateral 05/10/2017   Procedure: ROBOTIC ASSISTED TOTAL HYSTERECTOMY WITH SALPINGECTOMY;  Surgeon: Princess Bruins, MD;  Location: [facility];  Service: Gynecology;  Laterality: Bilateral;  laparoscopic  Request 7:30am OR start time in [place] [facility] block time  Requests 2 1/2 hours Dr. would like to use Fenestrated Bipolar (in cupboard Rm 7. Blue cord there also)    OB History     Gravida  1   Para  1   Term  1   Preterm      AB      Living  1      SAB      IAB      Ectopic      Multiple      Live Births               Home Medications    Prior to Admission medications   Medication Sig Start Date End Date Taking? Authorizing Provider  amLODipine (NORVASC) 5 MG tablet Take 1 tablet (5 mg total) by mouth at bedtime. 09/24/21   Martyn Malay, MD  buPROPion [facility] SR) 150 MG 12 hr tablet Take 1 tablet (150 mg total) by mouth 2 (two) times daily. 12/20/21   Martyn Malay, MD  hydroxychloroquine (  PLAQUENIL) 200 MG tablet Take 1 tablet (200 mg total) by mouth 2 (two) times daily. Patient taking differently: Take 200 mg by mouth daily. 04/12/21   Martyn Malay, MD  losartan (COZAAR) 50 MG tablet Take 2 tablets (100 mg total) by mouth at bedtime. 03/11/22   Martyn Malay, MD  omeprazole (PRILOSEC) 20 MG capsule TAKE 1 CAPSULE BY MOUTH EVERY DAY 09/24/21   Martyn Malay, MD  spironolactone (ALDACTONE) 25 MG tablet Take 1 tablet (25 mg total) by mouth daily. 09/24/21   Martyn Malay, MD    Family History Family History  Problem Relation Age of Onset   Hypertension Mother    Lung cancer Mother    Pneumonia Mother    Alcohol abuse Father    Lung cancer Father    Hypertension Father    Cystic fibrosis Sister    Diabetes Sister     Hypertension Sister    Diabetes Maternal Aunt    Diabetes Paternal Aunt    Diabetes Maternal Grandmother    Hypertension Son    Colon polyps Neg Hx    Esophageal cancer Neg Hx    Rectal cancer Neg Hx    Stomach cancer Neg Hx     Social History Social History   Tobacco Use   Smoking status: Never   Smokeless tobacco: Never  Vaping Use   Vaping Use: Never used  Substance Use Topics   Alcohol use: Yes    Comment: social   Drug use: No     Allergies   Citalopram hydrobromide   Review of Systems Review of Systems  Constitutional:  Negative for activity change, appetite change, fatigue and fever.  Gastrointestinal:  Negative for abdominal pain, diarrhea, nausea and vomiting.  Genitourinary:  Positive for dysuria, frequency and urgency. Negative for flank pain, genital sores, vaginal bleeding, vaginal discharge and vaginal pain.     Physical Exam Triage Vital Signs ED Triage Vitals  Enc Vitals Group     BP 04/23/22 1725 (!) 145/94     Pulse Rate 04/23/22 1725 69     Resp 04/23/22 1725 18     Temp 04/23/22 1725 98.4 F (36.9 C)     Temp src --      SpO2 04/23/22 1725 100 %     Weight --      Height --      Head Circumference --      Peak Flow --      Pain Score 04/23/22 1724 8     Pain Loc --      Pain Edu? --      Excl. in [place]? --    No data found.  Updated Vital Signs BP (!) 145/94   Pulse 69   Temp 98.4 F (36.9 C)   Resp 18   LMP 04/26/2017   SpO2 100%   Visual Acuity Right Eye Distance:   Left Eye Distance:   Bilateral Distance:    Right Eye Near:   Left Eye Near:    Bilateral Near:     Physical Exam Vitals reviewed.  Constitutional:      General: She is awake. She is not in acute distress.    Appearance: Normal appearance. She is well-developed. She is not ill-appearing.     Comments: Very pleasant female appears stated age no acute distress sitting comfortably in exam room  HENT:     Head: Normocephalic and atraumatic.   Cardiovascular:     Rate and Rhythm:  Normal rate and regular rhythm.     Heart sounds: Normal heart sounds, S1 normal and S2 normal. No murmur heard. Pulmonary:     Effort: Pulmonary effort is normal.     Breath sounds: Normal breath sounds. No wheezing, rhonchi or rales.     Comments: Clear to auscultation bilaterally Abdominal:     General: Bowel sounds are normal.     Palpations: Abdomen is soft.     Tenderness: There is no abdominal tenderness. There is no right CVA tenderness, left CVA tenderness, guarding or rebound.     Comments: Benign abdominal exam.  Psychiatric:        Behavior: Behavior is cooperative.      UC Treatments / Results  Labs (all labs ordered are listed, but only abnormal results are displayed) Labs Reviewed  POCT URINALYSIS DIPSTICK, ED / UC - Abnormal; Notable for the following components:      Result Value   Hgb urine dipstick SMALL (*)    All other components within normal limits  URINE CULTURE  CERVICOVAGINAL ANCILLARY ONLY    EKG   Radiology No results found.  Procedures Procedures (including critical care time)  Medications Ordered in UC Medications - No data to display  Initial Impression / Assessment and Plan / UC Course  I have reviewed the triage vital signs and the nursing notes.  Pertinent labs & imaging results that were available during my care of the patient were reviewed by me and considered in my medical decision making (see chart for details).     Patient is well-appearing, afebrile, nontoxic, nontachycardic.  UA shows trace hemoglobin but is otherwise normal.  Low suspicion for kidney stone as she has no CVA tenderness or significant flank pain.  Discussed that there is no indication for antibiotics based on today's results but we will send this for culture to ensure that there is not any urinary tract infection that needs to be treated.  We will defer antibiotics until results are available.  STI swab was collected to  rule out bacterial vaginosis or other etiology contributing to symptoms.  Discussed that given hematuria on UA she would need reevaluation.  Recommend she follow-up with urology and was given contact information for local provider with instruction to call to schedule an appointment.  Also recommend that she follow-up with her primary care within a few weeks.  If she develops any changing or worsening symptoms she is to be seen immediately.  If she develops any severe symptoms including pelvic pain, abdominal pain, fever, nausea, vomiting she needs to be seen immediately.  Strict return precautions given.  All questions answered to patient satisfaction.  Final Clinical Impressions(s) / UC Diagnoses   Final diagnoses:  Dysuria  Hematuria, unspecified type     Discharge Instructions      Your urine had a little bit of blood but was otherwise normal.  We are sending this off for culture and we will contact you if we need to start antibiotics.  We have also ordered a vaginal swab I will contact you if we need to arrange any treatment based on this result.  Since you have had blood in your urine I do recommend you follow-up with urology; call to schedule an appointment.  Make sure you are drinking plenty of fluid.  If you develop any additional symptoms including abdominal pain, fever, nausea, vomiting you need to be seen immediately.     ED Prescriptions   None    PDMP not  reviewed this encounter.   Terrilee Croak, PA-C 04/23/22 1807

## 2022-04-23 NOTE — ED Triage Notes (Signed)
Pt reports she has had hematuria  and dysuria for this past week.

## 2022-04-24 LAB — URINE CULTURE: Culture: NO GROWTH

## 2022-04-25 LAB — CERVICOVAGINAL ANCILLARY ONLY
Comment: NEGATIVE
Comment: NEGATIVE
Comment: NEGATIVE
Comment: NEGATIVE
Comment: NEGATIVE
Comment: NORMAL

## 2022-04-26 ENCOUNTER — Telehealth (HOSPITAL_COMMUNITY): Payer: Self-pay | Admitting: Emergency Medicine

## 2022-04-26 ENCOUNTER — Other Ambulatory Visit: Payer: Self-pay

## 2022-04-26 DIAGNOSIS — F32A Depression, unspecified: Secondary | ICD-10-CM

## 2022-04-26 MED ORDER — BUPROPION HCL ER (SR) 150 MG PO TB12: 150.0000 mg | ORAL_TABLET | Freq: Two times a day (BID) | ORAL | 11 refills | Status: DC

## 2022-04-26 NOTE — Telephone Encounter (Signed)
Patient will need to return for recollect of cyto swab due to insufficient material.  Reviewed with patient, she verbalized understanding

## 2022-04-27 ENCOUNTER — Encounter: Payer: Self-pay | Admitting: Nurse Practitioner

## 2022-04-27 ENCOUNTER — Ambulatory Visit: Payer: Medicare Other | Admitting: Nurse Practitioner

## 2022-04-27 VITALS — BP 152/90 | HR 94

## 2022-04-27 DIAGNOSIS — N898 Other specified noninflammatory disorders of vagina: Secondary | ICD-10-CM

## 2022-04-27 DIAGNOSIS — N760 Acute vaginitis: Secondary | ICD-10-CM | POA: Diagnosis not present

## 2022-04-27 DIAGNOSIS — B9689 Other specified bacterial agents as the cause of diseases classified elsewhere: Secondary | ICD-10-CM | POA: Diagnosis not present

## 2022-04-27 LAB — WET PREP FOR TRICH, YEAST, CLUE

## 2022-04-27 MED ORDER — METRONIDAZOLE 0.75 % VA GEL: 1.0000 | Freq: Every day | VAGINAL | 0 refills | Status: DC

## 2022-04-27 NOTE — Progress Notes (Signed)
   Acute Office Visit  Subjective:    Patient ID: Allison Steele, female    DOB: [birth year], 51 y.o.   MRN: [id number]   HPI 51 y.o. presents today for odor. Went to ER 04/23/2022 with complaints of malodorous urine, frequency, dysuria, and discolored urine. Negative UA and culture, vaginitis panel done by self collect but sample was inadequate. Urology follow up recommended. H/O kidney stones. Denies discharge, had mild itching last week. Last sexual encounter in October. Denies any urinary symptoms today.    Review of Systems  Constitutional: Negative.   Genitourinary:  Negative for difficulty urinating, dysuria, flank pain, frequency, hematuria, pelvic pain, urgency, vaginal discharge and vaginal pain.       Vaginal odor       Objective:    Physical Exam Constitutional:      Appearance: Normal appearance.  Genitourinary:    General: Normal vulva.     Vagina: No vaginal discharge or erythema.     BP (!) 152/90   Pulse 94   LMP 04/26/2017   SpO2 98%  Wt Readings from Last 3 Encounters:  03/11/22 170 lb 3.2 oz (77.2 kg)  02/28/22 171 lb 9.6 oz (77.8 kg)  12/30/21 169 lb (76.7 kg)        Patient informed chaperone available to be present for breast and/or pelvic exam. Patient has requested no chaperone to be present. Patient has been advised what will be completed during breast and pelvic exam.   Wet prep + clue cells (+ odor)  Assessment & Plan:   Problem List Items Addressed This Visit   None Visit Diagnoses     Bacterial vaginosis    -  Primary   Relevant Medications   metroNIDAZOLE (METROGEL) 0.75 % vaginal gel   Vaginal odor       Relevant Orders   C. trachomatis/N. gonorrhoeae RNA   WET PREP FOR [place], YEAST, CLUE (Completed)      Plan: Wet prep positive for clue cells - Metrogel 0.75% nightly x 5 nights. GC/CT pending.      Tamela Gammon DNP, 3:31 PM 04/27/2022

## 2022-04-28 LAB — C. TRACHOMATIS/N. GONORRHOEAE RNA
C. trachomatis RNA, TMA: NOT DETECTED
N. gonorrhoeae RNA, TMA: NOT DETECTED

## 2022-05-02 ENCOUNTER — Telehealth: Payer: Self-pay | Admitting: *Deleted

## 2022-05-02 ENCOUNTER — Other Ambulatory Visit: Payer: Self-pay | Admitting: Nurse Practitioner

## 2022-05-02 DIAGNOSIS — N760 Acute vaginitis: Secondary | ICD-10-CM

## 2022-05-02 MED ORDER — METRONIDAZOLE 500 MG PO TABS: 500.0000 mg | ORAL_TABLET | Freq: Two times a day (BID) | ORAL | 0 refills | Status: DC

## 2022-05-02 NOTE — Telephone Encounter (Signed)
Patient informed, patient also aware of negative STD screen.

## 2022-05-02 NOTE — Telephone Encounter (Signed)
Yes, I will send in Flagyl. Thanks.

## 2022-05-02 NOTE — Telephone Encounter (Signed)
Patient called was seen on 04/27/22 treated for BV with Metrogel reports the gel is not working. Reports she still notices vaginal odor, on day of cream. She asked if oral medication can be given? If yes, Rx should go to [facility]. Please advise

## 2022-05-11 ENCOUNTER — Telehealth: Payer: Self-pay

## 2022-05-11 ENCOUNTER — Other Ambulatory Visit: Payer: Self-pay | Admitting: Nurse Practitioner

## 2022-05-11 DIAGNOSIS — B3731 Acute candidiasis of vulva and vagina: Secondary | ICD-10-CM

## 2022-05-11 MED ORDER — FLUCONAZOLE 150 MG PO TABS: 150.0000 mg | ORAL_TABLET | ORAL | 0 refills | Status: DC

## 2022-05-11 NOTE — Telephone Encounter (Signed)
She was provided PO Flagyl after reporting gel did not seem to be working. Did she complete Flagyl course? If so, I recommend we treat for yeast and see if her symptoms improve. If not, she will need to be seen.

## 2022-05-11 NOTE — Telephone Encounter (Signed)
Sent. If symptoms persist she will need to make OV. Thanks.

## 2022-05-11 NOTE — Telephone Encounter (Signed)
Patient said she did complete the Flagyl Rx, she asked for yeast pill to be sent to [facility].

## 2022-05-11 NOTE — Telephone Encounter (Signed)
Pt calling to report being seen on 04/27/2022 for +BV and was Rxd metrogel. Pt stating that she completed course of gel and is still having sxs in spite of not having any intercourse.   Spoke with pt to obtain more detail. Pt states that she is still experiencing the odor but has also started experiencing itching/irritation since being seen. The only thing pt reports doing since being seen in the area is using a summers eve wipe.   Pt states she assumed the possibility of contracting a yeast infection so picked up a completed a 3day course of monistat and also used an OTC vulvar cream to help w/ sxs but voices her discomfort over the phone. Desires to know if she can be prescribed something or needs another office visit? Please advise.

## 2022-05-13 ENCOUNTER — Ambulatory Visit: Payer: Medicare Other | Admitting: Family Medicine

## 2022-05-15 ENCOUNTER — Ambulatory Visit (HOSPITAL_COMMUNITY)
Admission: EM | Admit: 2022-05-15 | Discharge: 2022-05-15 | Disposition: A | Discharge status: Home / Self Care | Payer: Medicare Other | Attending: Family Medicine | Admitting: Family Medicine

## 2022-05-15 ENCOUNTER — Encounter (HOSPITAL_COMMUNITY): Payer: Self-pay

## 2022-05-15 DIAGNOSIS — L0291 Cutaneous abscess, unspecified: Secondary | ICD-10-CM | POA: Diagnosis present

## 2022-05-15 DIAGNOSIS — N898 Other specified noninflammatory disorders of vagina: Secondary | ICD-10-CM | POA: Diagnosis present

## 2022-05-15 DIAGNOSIS — L02214 Cutaneous abscess of groin: Secondary | ICD-10-CM | POA: Diagnosis not present

## 2022-05-15 MED ORDER — SULFAMETHOXAZOLE-TRIMETHOPRIM 800-160 MG PO TABS: 1.0000 | ORAL_TABLET | Freq: Two times a day (BID) | ORAL | 0 refills | Status: AC

## 2022-05-15 MED ORDER — CLOTRIMAZOLE-BETAMETHASONE 1-0.05 % EX CREA: TOPICAL_CREAM | CUTANEOUS | 0 refills | Status: DC

## 2022-05-15 MED ORDER — METRONIDAZOLE 500 MG PO TABS: 500.0000 mg | ORAL_TABLET | Freq: Two times a day (BID) | ORAL | 0 refills | Status: DC

## 2022-05-15 NOTE — Discharge Instructions (Addendum)
You were seen today for vaginal itching.  The vaginal swab will be resulted by tomorrow.  I have treated you for BV based on your exam today.  I have sent out a cream and oral mediation.   I have sent out an antibiotic to treat the small cyst.  Please follow up with ob/gyn as directed.  I cannot see who you have an upcoming appointment with  in terms of the urologist so I am unable to give you the phone number at this time.

## 2022-05-15 NOTE — ED Provider Notes (Signed)
[facility]    CSN: [id number] Arrival date & time: 05/15/22  1000      History   Chief Complaint Chief Complaint  Patient presents with   Abscess   Vaginal Itching    HPI Allison Steele is a 51 y.o. female.   Patient is here for vaginal d/c.  She was seen by her ob/gyn on 11/22, treated with flagyl and she finished the course of medications.  (Flagyl, diflucan and metrogel) She is still here with vaginal itching, no obvious d/c noted.  She noted a knot at the side of her vaginal area several days ago.  No urinary symptoms noted.  No pain/burning.        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      Legally blind    Leber`s Optic Neuropathy - Dx at age 51, resulting in blindness, followed at [facility]   Nephrolithiasis    Sarcoidosis     - followed by Dr. 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   Essential hypertension 08/03/2006    Past Surgical History:  Procedure Laterality Date   ANKLE FRACTURE SURGERY Left    CESAREAN SECTION     GANGLION CYST EXCISION Left 12/15/2021   Procedure: LEFT DORSAL GANGLION CYST EXCISION;  Surgeon: Sherilyn Cooter, MD;  Location: [place];  Service: Orthopedics;  Laterality: Left;   ROBOTIC ASSISTED TOTAL HYSTERECTOMY WITH SALPINGECTOMY Bilateral 05/10/2017   Procedure: ROBOTIC ASSISTED TOTAL  HYSTERECTOMY WITH SALPINGECTOMY;  Surgeon: Princess Bruins, MD;  Location: [facility];  Service: Gynecology;  Laterality: Bilateral;  laparoscopic  Request 7:30am OR start time in [place] Gyn block time  Requests 2 1/2 hours Dr. would like to use Fenestrated Bipolar (in cupboard Rm 7. Blue cord there also)    OB History     Gravida  1   Para  1   Term  1   Preterm      AB      Living  1      SAB      IAB      Ectopic      Multiple      Live Births               Home Medications    Prior to Admission medications   Medication Sig Start Date End Date Taking? Authorizing Provider  amLODipine (NORVASC) 5 MG tablet Take 1 tablet (5 mg total) by mouth at bedtime. 09/24/21  Yes Martyn Malay, MD  buPROPion [facility] SR) 150 MG 12 hr tablet Take 1 tablet (150 mg total) by mouth 2 (two) times daily. 04/26/22  Yes Martyn Malay, MD  losartan (COZAAR) 50 MG tablet Take 2 tablets (100 mg total) by mouth at bedtime. 03/11/22  Yes Martyn Malay, MD  omeprazole (PRILOSEC) 20 MG capsule TAKE 1 CAPSULE BY MOUTH EVERY DAY  09/24/21  Yes Martyn Malay, MD  spironolactone (ALDACTONE) 25 MG tablet Take 1 tablet (25 mg total) by mouth daily. 09/24/21  Yes Martyn Malay, MD  fluconazole (DIFLUCAN) 150 MG tablet Take 1 tablet (150 mg total) by mouth every 3 (three) days. 05/11/22   Tamela Gammon, NP  metroNIDAZOLE (FLAGYL) 500 MG tablet Take 1 tablet (500 mg total) by mouth 2 (two) times daily. 05/02/22   Tamela Gammon, NP    Family History Family History  Problem Relation Age of Onset   Hypertension Mother    Lung cancer Mother    Pneumonia Mother    Alcohol abuse Father    Lung cancer Father    Hypertension Father    Cystic fibrosis Sister    Diabetes Sister    Hypertension Sister    Diabetes Maternal Aunt    Diabetes Paternal Aunt    Diabetes Maternal Grandmother    Hypertension Son    Colon polyps Neg Hx    Esophageal cancer Neg Hx    Rectal cancer  Neg Hx    Stomach cancer Neg Hx     Social History Social History   Tobacco Use   Smoking status: Never   Smokeless tobacco: Never  Vaping Use   Vaping Use: Never used  Substance Use Topics   Alcohol use: Yes    Comment: social   Drug use: No     Allergies   Citalopram hydrobromide   Review of Systems Review of Systems  Constitutional: Negative.   HENT: Negative.    Respiratory: Negative.    Cardiovascular: Negative.   Gastrointestinal: Negative.   Genitourinary:  Positive for vaginal discharge.  Musculoskeletal: Negative.   Psychiatric/Behavioral: Negative.       Physical Exam Triage Vital Signs ED Triage Vitals  Enc Vitals Group     BP 05/15/22 1014 129/86     Pulse Rate 05/15/22 1014 82     Resp 05/15/22 1014 16     Temp 05/15/22 1014 98.1 F (36.7 C)     Temp Source 05/15/22 1014 Oral     SpO2 05/15/22 1014 96 %     Weight --      Height --      Head Circumference --      Peak Flow --      Pain Score 05/15/22 1012 8     Pain Loc --      Pain Edu? --      Excl. in [place]? --    No data found.  Updated Vital Signs BP 129/86 (BP Location: Left Arm)   Pulse 82   Temp 98.1 F (36.7 C) (Oral)   Resp 16   LMP 04/26/2017   SpO2 96%   Visual Acuity Right Eye Distance:   Left Eye Distance:   Bilateral Distance:    Right Eye Near:   Left Eye Near:    Bilateral Near:     Physical Exam Constitutional:      Appearance: Normal appearance.  Cardiovascular:     Rate and Rhythm: Normal rate.  Pulmonary:     Effort: Pulmonary effort is normal.  Genitourinary:    Comments: There is  a small cyst noted to the left groin area;  slightly red and tender;  unable to open and drain today.  No obvious d/c on external exam;  there is mild white, milky d/c on speculum exam Skin:    General: Skin is warm.  Neurological:  General: No focal deficit present.     Mental Status: She is alert.  Psychiatric:        Mood and Affect: Mood normal.      UC  Treatments / Results  Labs (all labs ordered are listed, but only abnormal results are displayed) Labs Reviewed  CERVICOVAGINAL ANCILLARY ONLY    EKG   Radiology No results found.  Procedures Procedures (including critical care time)  Medications Ordered in UC Medications - No data to display  Initial Impression / Assessment and Plan / UC Course  I have reviewed the triage vital signs and the nursing notes.  Pertinent labs & imaging results that were available during my care of the patient were reviewed by me and considered in my medical decision making (see chart for details).    Final Clinical Impressions(s) / UC Diagnoses   Final diagnoses:  Abscess  Vaginal itching     Discharge Instructions      You were seen today for vaginal itching.  The vaginal swab will be resulted by tomorrow.  I have treated you for BV based on your exam today.  I have sent out a cream and oral mediation.   I have sent out an antibiotic to treat the small cyst.  Please follow up with ob/gyn as directed.  I cannot see who you have an upcoming appointment with  in terms of the urologist so I am unable to give you the phone number at this time.     ED Prescriptions     Medication Sig Dispense Auth. Provider   metroNIDAZOLE (FLAGYL) 500 MG tablet Take 1 tablet (500 mg total) by mouth 2 (two) times daily. 14 tablet Treg Diemer, MD   clotrimazole-betamethasone (LOTRISONE) cream Apply to affected area 2 times daily prn 15 g Oakes Mccready, MD   sulfamethoxazole-trimethoprim (BACTRIM DS) 800-160 MG tablet Take 1 tablet by mouth 2 (two) times daily for 7 days. 14 tablet Rondel Oh, MD      PDMP not reviewed this encounter.   Rondel Oh, MD 05/15/22 1055

## 2022-05-15 NOTE — ED Triage Notes (Signed)
Chief Complaint: was seen last week for UTI. Was seen by OBGYN and diagnosed with BV. Took all the meds and now have a bump on the inner left thigh and vaginal itching.   Onset: 3 days   Prescriptions or OTC medications tried: Yes- diflucan; Monostat; antibiotic.    with no relief  Sick exposure: No  New foods, medications, or products: No  Recent Travel: No

## 2022-05-16 ENCOUNTER — Telehealth: Payer: Self-pay

## 2022-05-16 LAB — CERVICOVAGINAL ANCILLARY ONLY
Bacterial Vaginitis (gardnerella): POSITIVE — AB
Candida Glabrata: NEGATIVE
Candida Vaginitis: NEGATIVE
Chlamydia: NEGATIVE
Comment: NEGATIVE
Comment: NEGATIVE
Comment: NEGATIVE
Comment: NEGATIVE
Comment: NEGATIVE
Comment: NORMAL
Neisseria Gonorrhea: NEGATIVE
Trichomonas: NEGATIVE

## 2022-05-16 NOTE — Telephone Encounter (Signed)
FYI. Pt seen @ UC yesterday for itching and a knot at side of vaginal area. Pt was given medication to treat BV (Cream and oral) and also an antibiotic for tx of cyst.   Pt calling today stating she is still having the same troubles. I had asked if she planned to take medications that was given to her by UC and she said yes but she still wants to come and see [place]. I advised her will have someone from scheduling give her a call. She voiced understanding.

## 2022-05-16 NOTE — Telephone Encounter (Signed)
FYI. Pt scheduled for 05/18/2022.

## 2022-05-17 ENCOUNTER — Telehealth (HOSPITAL_COMMUNITY): Payer: Self-pay | Admitting: Emergency Medicine

## 2022-05-17 MED ORDER — METRONIDAZOLE 500 MG PO TABS: 500.0000 mg | ORAL_TABLET | Freq: Two times a day (BID) | ORAL | 0 refills | Status: DC

## 2022-05-18 ENCOUNTER — Ambulatory Visit: Payer: Medicare Other | Admitting: Obstetrics & Gynecology

## 2022-05-18 ENCOUNTER — Encounter: Payer: Self-pay | Admitting: Obstetrics & Gynecology

## 2022-05-18 VITALS — BP 120/80 | HR 96

## 2022-05-18 DIAGNOSIS — N760 Acute vaginitis: Secondary | ICD-10-CM

## 2022-05-18 DIAGNOSIS — B9689 Other specified bacterial agents as the cause of diseases classified elsewhere: Secondary | ICD-10-CM

## 2022-05-18 DIAGNOSIS — N898 Other specified noninflammatory disorders of vagina: Secondary | ICD-10-CM

## 2022-05-18 LAB — WET PREP FOR TRICH, YEAST, CLUE

## 2022-05-18 MED ORDER — TINIDAZOLE 500 MG PO TABS: 1000.0000 mg | ORAL_TABLET | Freq: Two times a day (BID) | ORAL | 0 refills | Status: AC

## 2022-05-18 MED ORDER — FLUCONAZOLE 150 MG PO TABS: 150.0000 mg | ORAL_TABLET | ORAL | 0 refills | Status: AC

## 2022-05-18 NOTE — Progress Notes (Signed)
    Allison Steele 1971/05/16 [id number]        50 y.o.  G1P1001   RP: Vaginal itching and odor  HPI: Vaginal itching and odor.  Urgent care 05/15/22 treated with Metronidazole, Lotrisone cream and Bactrim DS.  Resolving infected sebaceous cyst at the vulva.    OB History  Gravida Para Term Preterm AB Living  '1 1 1     1  '$ SAB IAB Ectopic Multiple Live Births               # Outcome Date GA Lbr Len/2nd Weight Sex Delivery Anes PTL Lv  1 Term             Past medical history,surgical history, problem list, medications, allergies, family history and social history were all reviewed and documented in the EPIC chart.   Directed ROS with pertinent positives and negatives documented in the history of present illness/assessment and plan.  Exam:  Vitals:   05/18/22 1336  BP: 120/80  Pulse: 96  SpO2: 99%   General appearance:  Normal  Abdomen: Normal  Gynecologic exam: Vulva normal. Speculum:  Vagina normal.  Thick white d/c present.  Wet prep done.  Wet prep:  Clue cells and odor present   Assessment/Plan:  51 y.o. G1P1001   1. Vaginal itching and odor Vaginal itching and odor.  Urgent care 05/15/22 treated with Metronidazole, Lotrisone cream and Bactrim DS.  Resolving infected sebaceous cyst at the vulva.  BV confirmed by Wet prep.   - WET PREP FOR TRICH, YEAST, CLUE  2. Bacterial vaginosis Will treat with Tinidazole.  Follow with Fluconazole.  Other orders - tinidazole (TINDAMAX) 500 MG tablet; Take 2 tablets (1,000 mg total) by mouth 2 (two) times daily for 2 days. - fluconazole (DIFLUCAN) 150 MG tablet; Take 1 tablet (150 mg total) by mouth every other day for 3 doses.   [name], 2:02 PM 05/18/2022

## 2022-05-24 ENCOUNTER — Other Ambulatory Visit: Payer: Self-pay

## 2022-05-24 ENCOUNTER — Encounter: Payer: Self-pay | Admitting: Obstetrics & Gynecology

## 2022-05-24 ENCOUNTER — Ambulatory Visit: Payer: Medicare Other | Admitting: Obstetrics & Gynecology

## 2022-05-24 VITALS — BP 110/72 | HR 74

## 2022-05-24 DIAGNOSIS — I10 Essential (primary) hypertension: Secondary | ICD-10-CM

## 2022-05-24 DIAGNOSIS — N898 Other specified noninflammatory disorders of vagina: Secondary | ICD-10-CM

## 2022-05-24 DIAGNOSIS — B3731 Acute candidiasis of vulva and vagina: Secondary | ICD-10-CM

## 2022-05-24 LAB — WET PREP FOR TRICH, YEAST, CLUE

## 2022-05-24 MED ORDER — SPIRONOLACTONE 25 MG PO TABS: 25.0000 mg | ORAL_TABLET | Freq: Every day | ORAL | 11 refills | Status: DC

## 2022-05-24 MED ORDER — FLUCONAZOLE 150 MG PO TABS: 150.0000 mg | ORAL_TABLET | ORAL | 0 refills | Status: AC

## 2022-05-24 NOTE — Progress Notes (Signed)
    Allison Steele 07/19/1970 [id number]        51 y.o.  G1P1001   RP: Recurrence of vaginal odor and itching post BV/Yeast treatment  HPI: Patient very upset to still have vaginal odors and itching.  Treated for BV with Tinidazole on 05/18/22, followed by Fluconazole.  No pelvic pain.  Not sexually active x last visit.  Urine/BMs normal.  No fever.  S/P Total Hysterectomy.  Very anxious about everything, made an appointment with her Fam MD on 06/01/22.   OB History  Gravida Para Term Preterm AB Living  '1 1 1     1  '$ SAB IAB Ectopic Multiple Live Births               # Outcome Date GA Lbr Len/2nd Weight Sex Delivery Anes PTL Lv  1 Term             Past medical history,surgical history, problem list, medications, allergies, family history and social history were all reviewed and documented in the EPIC chart.   Directed ROS with pertinent positives and negatives documented in the history of present illness/assessment and plan.  Exam:  Vitals:   05/24/22 0855  BP: 110/72  Pulse: 74  SpO2: 98%   General appearance:  Normal  Abdomen: Normal  Gynecologic exam: Vulva normal.  Speculum:  Vagina normal.  Wet prep and SureSwab Advanced Plus done.  Wet Prep:  Yeasts present   Assessment/Plan:  51 y.o. G1P1001   1. Vaginal odor Patient very upset to still have vaginal odors and itching.  Treated for BV with Tinidazole on 05/18/22, followed by Fluconazole.  No pelvic pain.  Not sexually active x last visit.  Urine/BMs normal.  No fever.  S/P Total Hysterectomy.  Very anxious about everything, made an appointment with her Fam MD on 06/01/22.  Yeasts present on Wet prep, no BV.  Reassured.  Pending SureSwab Advanced Plus. - WET PREP FOR TRICH, YEAST, CLUE - SureSwab Advanced Vaginitis Plus,TMA  2. Vaginal itching Yeasts present on Wet Prep.  Will treat with Fluconazole every other day x 1 week.  Boric Acid HS x 1 week.  Increase yogurt intake.  May use Hydrocortisone 1% cream on  the vulva as needed. - WET PREP FOR TRICH, YEAST, CLUE - SureSwab Advanced Vaginitis Plus,TMA  - fluconazole (DIFLUCAN) 150 MG tablet; Take 1 tablet (150 mg total) by mouth every other day for 4 doses.   [name], 9:01 AM 05/24/2022

## 2022-05-25 LAB — SURESWAB® ADVANCED VAGINITIS PLUS,TMA
C. trachomatis RNA, TMA: NOT DETECTED
CANDIDA SPECIES: NOT DETECTED
Candida glabrata: NOT DETECTED
N. gonorrhoeae RNA, TMA: NOT DETECTED
SURESWAB(R) ADV BACTERIAL VAGINOSIS(BV),TMA: NEGATIVE
TRICHOMONAS VAGINALIS (TV),TMA: NOT DETECTED

## 2022-05-26 ENCOUNTER — Telehealth: Payer: Self-pay | Admitting: *Deleted

## 2022-05-26 NOTE — Telephone Encounter (Signed)
Patient called requesting labs from visit on 06/24/21 patient informed sureswab was negative.

## 2022-06-01 ENCOUNTER — Encounter: Payer: Self-pay | Admitting: Student

## 2022-06-01 ENCOUNTER — Ambulatory Visit (INDEPENDENT_AMBULATORY_CARE_PROVIDER_SITE_OTHER): Payer: Medicare Other | Admitting: Student

## 2022-06-01 VITALS — BP 147/87 | HR 85 | Ht 63.0 in | Wt 162.8 lb

## 2022-06-01 DIAGNOSIS — N760 Acute vaginitis: Secondary | ICD-10-CM

## 2022-06-01 DIAGNOSIS — B9689 Other specified bacterial agents as the cause of diseases classified elsewhere: Secondary | ICD-10-CM

## 2022-06-01 DIAGNOSIS — I10 Essential (primary) hypertension: Secondary | ICD-10-CM | POA: Diagnosis not present

## 2022-06-01 MED ORDER — METRONIDAZOLE 0.75 % EX GEL: 1.0000 | Freq: Two times a day (BID) | CUTANEOUS | 0 refills | Status: DC

## 2022-06-01 NOTE — Assessment & Plan Note (Signed)
Recurrent, patient says that she has had over 3 episodes in the past year.  She denies any vaginal discharge but has been having vaginal odor which she finds very concerning.  We discussed that she could try metronidazole maintenance therapy which she was amenable to.  We discussed stopping vaginal odor films and other products such as scented products/douching. -Metronidazole 0.75 gel 5 g 2 times a week 4 to 6 months -Monitor at future visit

## 2022-06-01 NOTE — Assessment & Plan Note (Signed)
Mildly elevated today 144/95 to 147/87. -Continue losartan 50 mg (patient not taking full dose of 100 mg, monitor) -Continue spironolactone 25 -Continue amlodipine 5

## 2022-06-01 NOTE — Progress Notes (Signed)
    SUBJECTIVE:   CHIEF COMPLAINT / HPI: Recurrent BV and HTN follow-up  Recurrent BV Patient is very distressed that she has been having BV multiple times.  She denies any vaginal discharge but says that she is very concerned about her vaginal odor and feels like she is depressed because she feels like everyone can smell her.  She has been trying VRO, boric acid, VCF vaginal odor films.  Only changed to an unscented [place] from her previously antibacterial soap that she had been using on her vaginal area.  Hypertension: Patient is a 51 y.o. female who present today for follow up of hypertension.   Patient endorses no problems she says when she takes 100 mg of losartan she feels like she has leg cramps so she is only been taking 50 mg  Home medications include: losartan 50 mg, spironolactone 25, amlodipine 5 Patient endorses taking these medications as prescribed.  Most recent creatinine trend:  Lab Results  Component Value Date   CREATININE 0.89 03/11/2022   CREATININE 0.81 12/09/2021   CREATININE 0.90 09/24/2021   Patient does check blood pressure at home.  Patient has had a BMP in the past 1 year.  PERTINENT  PMH / PSH: cutaneous sarcoidosis  OBJECTIVE:   BP (!) 147/87   Pulse 85   Ht '5\' 3"'$  (1.6 m)   Wt 162 lb 12.8 oz (73.8 kg)   LMP 04/26/2017   SpO2 100%   BMI 28.84 kg/m   General: Well appearing, NAD, anxious appearing, awake, alert, responsive to questions Head: Normocephalic atraumatic CV: Regular rate and rhythm no murmurs rubs or gallops Respiratory: Clear to ausculation bilaterally, no wheezes rales or crackles, chest rises symmetrically,  no increased work of breathing Abdomen: Soft, non-tender, non-distended  ASSESSMENT/PLAN:   BV (bacterial vaginosis) Recurrent, patient says that she has had over 3 episodes in the past year.  She denies any vaginal discharge but has been having vaginal odor which she finds very concerning.  We discussed that  she could try metronidazole maintenance therapy which she was amenable to.  We discussed stopping vaginal odor films and other products such as scented products/douching. -Metronidazole 0.75 gel 5 g 2 times a week 4 to 6 months -Monitor at future visit  Essential hypertension Mildly elevated today 144/95 to 147/87. -Continue losartan 50 mg (patient not taking full dose of 100 mg, monitor) -Continue spironolactone 25 -Continue amlodipine 5    Gerrit Heck, MD [place]

## 2022-06-01 NOTE — Patient Instructions (Signed)
It was great to see you! Thank you for allowing me to participate in your care!   Our plans for today:  -  We will try metronidazole 0.75% gel, 5-gram dose, placed intravaginally twice a week for four to six months to see if this can help - Please avoid douching, vaginal odor films when doing this - Drink lots of water - Consider lactobacillus supplementation - Do not wear tight clothing-use cotton underwear  - We will continue you on the BP medications as you have been taking them  Take care and seek immediate care sooner if you develop any concerns.  Gerrit Heck, MD

## 2022-08-03 ENCOUNTER — Other Ambulatory Visit: Payer: Self-pay | Admitting: Student

## 2022-08-03 DIAGNOSIS — B9689 Other specified bacterial agents as the cause of diseases classified elsewhere: Secondary | ICD-10-CM

## 2022-08-25 NOTE — Progress Notes (Signed)
Error

## 2022-08-29 ENCOUNTER — Ambulatory Visit (INDEPENDENT_AMBULATORY_CARE_PROVIDER_SITE_OTHER): Payer: Medicare Other | Admitting: Family Medicine

## 2022-08-29 DIAGNOSIS — I10 Essential (primary) hypertension: Secondary | ICD-10-CM

## 2022-08-29 DIAGNOSIS — Z0000 Encounter for general adult medical examination without abnormal findings: Secondary | ICD-10-CM

## 2022-08-30 ENCOUNTER — Other Ambulatory Visit: Payer: Self-pay | Admitting: *Deleted

## 2022-08-30 DIAGNOSIS — F32A Depression, unspecified: Secondary | ICD-10-CM

## 2022-08-30 MED ORDER — BUPROPION HCL ER (SR) 150 MG PO TB12: 150.0000 mg | ORAL_TABLET | Freq: Two times a day (BID) | ORAL | 11 refills | Status: DC

## 2022-09-15 NOTE — Progress Notes (Signed)
    SUBJECTIVE:   Chief compliant/HPI: annual examination  Allison Steele is a 52 y.o. who presents today for an annual exam.  She reports her BV symptoms are resolved. She is using expensive unscented soap and Metrogel. She does report some family losses-her grandmother died recently.   Hypertension BP goal: 130/80 Current medications: Losartan, Amlodipine, Spironolactone Side effects: none--no headaches, vision changes, chest pain Adherence: good    History tabs reviewed and updated.   Review of systems form reviewed and notable for none.   OBJECTIVE:   BP (!) 168/90   Pulse 76   Ht  (1.6 m)   Wt 169 lb 6.4 oz (76.8 kg)   LMP 04/26/2017   SpO2 100%   BMI 30.01 kg/m   Today's Vitals   09/16/22 0828 09/16/22 0845  BP: (!) 157/91 (!) 168/90  Pulse: 76   SpO2: 100%   Weight: 169 lb 6.4 oz (76.8 kg)   Height:  (1.6 m)   PainSc: 0-No pain    Body mass index is 30.01 kg/m. HEENT: EOMI. Sclera without injection or icterus. MMM.  L External auditory canal examined and WNL. Cerumen in R EAC TM normal appearance, no erythema or bulging. Neck: Supple.  Cardiac: Regular rate and rhythm. Normal S1/S2. No murmurs, rubs, or gallops appreciated. Lungs: Clear bilaterally to ascultation.  Abdomen: Normoactive bowel sounds. No tenderness to deep or light palpation. No rebound or guarding.  Neuro:  Normal speech Normal gait Ext: No edema   Psych: Pleasant and appropriate     ASSESSMENT/PLAN:   Cutaneous sarcoidosis No longer on Plaquenil--sees Rheumatology in April   Leber's hereditary optic neuropathy Will call to schedule with her Eye care physician   Essential hypertension Not at goal Has not tolerated HCTZ in the past Schedule 24 hour BP monitor Consider combination pill and increasing dose  Follow up with me 2 weeks after BP monitor  Current meds: Amlodipine 5 mg, Losartan, and Spironolactone   Annual Examination  See AVS for age appropriate  recommendations  PHQ score noted, reviewed and discussed.  BP reviewed and at goal at goal.   Considered the following items based upon USPSTF recommendations: Diabetes screening: ordered Screening for elevated cholesterol: ordered  Cervical cancer screening: not indicated given history of hysterectomy with prior normal cytology.  Breast cancer screening:  UTD Colorectal cancer screening: up to date on screening for CRC. Due 2032  Vaccinations Discussed COVID--had at work.   Follow up with Dr. Raymondo Band for 24 hour BP monitor then me in 1 month    Allison Steppe Bartholome Bill, MD [facility]

## 2022-09-16 ENCOUNTER — Other Ambulatory Visit: Payer: Self-pay

## 2022-09-16 ENCOUNTER — Encounter: Payer: Self-pay | Admitting: Family Medicine

## 2022-09-16 ENCOUNTER — Ambulatory Visit: Payer: Medicare Other | Admitting: Family Medicine

## 2022-09-16 VITALS — BP 168/90 | HR 76 | Ht 63.0 in | Wt 169.4 lb

## 2022-09-16 DIAGNOSIS — I10 Essential (primary) hypertension: Secondary | ICD-10-CM | POA: Diagnosis not present

## 2022-09-16 DIAGNOSIS — Z0000 Encounter for general adult medical examination without abnormal findings: Secondary | ICD-10-CM | POA: Diagnosis not present

## 2022-09-16 DIAGNOSIS — D863 Sarcoidosis of skin: Secondary | ICD-10-CM | POA: Diagnosis not present

## 2022-09-16 DIAGNOSIS — H4722 Hereditary optic atrophy: Secondary | ICD-10-CM | POA: Diagnosis not present

## 2022-09-16 NOTE — Assessment & Plan Note (Signed)
Will call to schedule with her Eye care physician

## 2022-09-16 NOTE — Assessment & Plan Note (Signed)
No longer on Plaquenil--sees Rheumatology in April

## 2022-09-16 NOTE — Patient Instructions (Addendum)
It was wonderful to see you today.  Please bring ALL of your medications with you to every visit.   Today we talked about:  -Getting blood work  - We cleaned out your Right ear   - Your blood pressure please schedule with Dr. Raymondo Band at the front for 24 monitor--you will schedule this at the front when you check out    Please follow up in 1 month for your blood pressure   Thank you for choosing [facility].   Please call [phone] with any questions about today's appointment.  Please be sure to schedule follow up at the front  desk before you leave today.   Terisa Starr, MD  Family Medicine

## 2022-09-16 NOTE — Assessment & Plan Note (Signed)
Not at goal Has not tolerated HCTZ in the past Schedule 24 hour BP monitor Consider combination pill and increasing dose  Follow up with me 2 weeks after BP monitor

## 2022-09-17 LAB — CBC
Hematocrit: 40.2 % (ref 34.0–46.6)
Hemoglobin: 13.7 g/dL (ref 11.1–15.9)
MCH: 32.9 pg (ref 26.6–33.0)
MCHC: 34.1 g/dL (ref 31.5–35.7)
MCV: 96 fL (ref 79–97)
Platelets: 273 10*3/uL (ref 150–450)
RBC: 4.17 x10E6/uL (ref 3.77–5.28)
RDW: 11.7 % (ref 11.7–15.4)
WBC: 3.6 10*3/uL (ref 3.4–10.8)

## 2022-09-17 LAB — BASIC METABOLIC PANEL
BUN/Creatinine Ratio: 14 (ref 9–23)
BUN: 13 mg/dL (ref 6–24)
CO2: 23 mmol/L (ref 20–29)
Calcium: 9.7 mg/dL (ref 8.7–10.2)
Chloride: 104 mmol/L (ref 96–106)
Creatinine, Ser: 0.94 mg/dL (ref 0.57–1.00)
Glucose: 99 mg/dL (ref 70–99)
Potassium: 3.8 mmol/L (ref 3.5–5.2)
Sodium: 141 mmol/L (ref 134–144)
eGFR: 73 mL/min/{1.73_m2} (ref >59)

## 2022-09-17 LAB — HEMOGLOBIN A1C
Est. average glucose Bld gHb Est-mCnc: 105 mg/dL
Hgb A1c MFr Bld: 5.3 % (ref 4.8–5.6)

## 2022-09-17 LAB — LIPID PANEL
Chol/HDL Ratio: 2.7 ratio (ref 0.0–4.4)
Cholesterol, Total: 181 mg/dL (ref 100–199)
HDL: 66 mg/dL (ref >39)
LDL Chol Calc (NIH): 96 mg/dL (ref 0–99)
Triglycerides: 106 mg/dL (ref 0–149)
VLDL Cholesterol Cal: 19 mg/dL (ref 5–40)

## 2022-09-27 ENCOUNTER — Other Ambulatory Visit: Payer: Self-pay

## 2022-09-27 DIAGNOSIS — I10 Essential (primary) hypertension: Secondary | ICD-10-CM

## 2022-09-27 MED ORDER — AMLODIPINE BESYLATE 5 MG PO TABS: 5.0000 mg | ORAL_TABLET | Freq: Every day | ORAL | 11 refills | Status: DC

## 2022-09-30 ENCOUNTER — Telehealth: Payer: Self-pay | Admitting: Family Medicine

## 2022-09-30 NOTE — Telephone Encounter (Signed)
Contacted Allison Steele to schedule their annual wellness visit. Appointment made for 10/03/2022.  Thank you,  [facility] Direct dial  [phone]

## 2022-10-03 ENCOUNTER — Ambulatory Visit (INDEPENDENT_AMBULATORY_CARE_PROVIDER_SITE_OTHER): Payer: Medicare Other

## 2022-10-03 DIAGNOSIS — Z0000 Encounter for general adult medical examination without abnormal findings: Secondary | ICD-10-CM

## 2022-10-03 NOTE — Patient Instructions (Signed)

## 2022-10-03 NOTE — Progress Notes (Cosign Needed)
I connected with  Allison Steele on 10/03/22 by a audio enabled telemedicine application and verified that I am speaking with the correct person using two identifiers.  Patient Location: Home  Provider Location: Home Office  I discussed the limitations of evaluation and management by telemedicine. The patient expressed understanding and agreed to proceed.   Subjective:   Allison Steele is a 52 y.o. female who presents for Medicare Annual (Subsequent) preventive examination.  Review of Systems    Per HPI unless specifically indicated below.        Objective:       09/16/2022    8:45 AM 09/16/2022    8:28 AM 06/01/2022    9:07 AM  Vitals with BMI  Height  5\' 3"    Weight  169 lbs 6 oz   BMI  30.02   Systolic 168 157 604  Diastolic 90 91 87  Pulse  76     There were no vitals filed for this visit. There is no height or weight on file to calculate BMI.     06/01/2022    8:31 AM 12/15/2021    7:20 AM 10/16/2021    8:41 AM 08/30/2021    8:23 AM 07/13/2021    9:56 AM 03/05/2021    8:29 AM 10/02/2020   10:17 AM  Advanced Directives  Does Patient Have a Medical Advance Directive? No No No No No No No  Would patient like information on creating a medical advance directive? No - Patient declined No - Patient declined No - Patient declined No - Patient declined No - Patient declined No - Patient declined No - Patient declined    Current Medications (verified) Outpatient Encounter Medications as of 10/03/2022  Medication Sig   amLODipine (NORVASC) 5 MG tablet Take 1 tablet (5 mg total) by mouth at bedtime.   buPROPion (WELLBUTRIN SR) 150 MG 12 hr tablet Take 1 tablet (150 mg total) by mouth 2 (two) times daily.   losartan (COZAAR) 50 MG tablet Take 2 tablets (100 mg total) by mouth at bedtime.   metroNIDAZOLE (METROGEL) 0.75 % vaginal gel APPLY 1 X5GRAM APPLICATION PLACED INTRAVAGINALLY TWICE A WEEK FOR FOUR TO SIX MONTHS   omeprazole (PRILOSEC) 20 MG capsule TAKE 1 CAPSULE BY  MOUTH EVERY DAY   spironolactone (ALDACTONE) 25 MG tablet Take 1 tablet (25 mg total) by mouth daily.   No facility-administered encounter medications on file as of 10/03/2022.    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      Legally blind    Leber`s Optic Neuropathy - Dx at age 75, resulting in blindness, followed at [facility]   Nephrolithiasis    Sarcoidosis     - followed by Dr. Emily Filbert, [facility], [phone]   Past Surgical History:  Procedure Laterality Date   ANKLE FRACTURE SURGERY Left    CESAREAN SECTION     GANGLION CYST EXCISION Left 12/15/2021   Procedure: LEFT DORSAL GANGLION CYST EXCISION;  Surgeon: Marlyne Beards, MD;  Location: [facility];  Service: Orthopedics;  Laterality: Left;   ROBOTIC ASSISTED TOTAL HYSTERECTOMY WITH SALPINGECTOMY Bilateral 05/10/2017  Procedure: ROBOTIC ASSISTED TOTAL HYSTERECTOMY WITH SALPINGECTOMY;  Surgeon: Genia Del, MD;  Location: [facility];  Service: Gynecology;  Laterality: Bilateral;  laparoscopic  Request 7:30am OR start time in [place] [facility] block time  Requests 2 1/2 hours Dr. would like to use Fenestrated Bipolar (in cupboard Rm 7. Blue cord there also)   Family History  Problem Relation Age of Onset   Hypertension Mother    Lung cancer Mother    Pneumonia Mother    Alcohol abuse Father    Lung cancer Father    Hypertension Father    Cystic fibrosis Sister    Diabetes Sister    Hypertension Sister    Diabetes Maternal Aunt    Diabetes Paternal Aunt    Diabetes Maternal Grandmother    Hypertension Son    Colon polyps Neg Hx    Esophageal cancer Neg Hx     Rectal cancer Neg Hx    Stomach cancer Neg Hx    Social History   Socioeconomic History   Marital status: Divorced    Spouse name: Not on file   Number of children: 1   Years of education: 12   Highest education level: High school graduate  Occupational History   Occupation: [employer]  Tobacco Use   Smoking status: Never   Smokeless tobacco: Never  Vaping Use   Vaping Use: Never used  Substance and Sexual Activity   Alcohol use: Yes    Comment: social   Drug use: No   Sexual activity: Not Currently    Partners: Male    Birth control/protection: Surgical, Abstinence    Comment: hysterectomy, younger than 16, partners 5  Other Topics Concern   Not on file  Social History Narrative   Patient lives alone in [place].   Patient has one son in the area.    Patient is legally blind and does not drive. Patient uses [employer] for transportation.    Patient exercises 3x per week for 1 hour.    Enjoys watching movies and reading.    Works at [employer] for [employer].   Social Determinants of Health   Financial Resource Strain: Low Risk  (10/03/2022)   Overall Financial Resource Strain (CARDIA)    Difficulty of Paying Living Expenses: Not hard at all  Food Insecurity: No Food Insecurity (10/03/2022)   Hunger Vital Sign    Worried About Running Out of Food in the Last Year: Never true    Ran Out of Food in the Last Year: Never true  Transportation Needs: No Transportation Needs (10/03/2022)   PRAPARE - Administrator, Civil Service (Medical): No    Lack of Transportation (Non-Medical): No  Physical Activity: Insufficiently Active (10/03/2022)   Exercise Vital Sign    Days of Exercise per Week: 2 days    Minutes of Exercise per Session: 40 min  Stress: No Stress Concern Present (10/03/2022)   [employer] of Occupational Health - Occupational Stress Questionnaire    Feeling of Stress : Not at all  Social Connections: Moderately Isolated (10/03/2022)    Social Connection and Isolation Panel [NHANES]    Frequency of Communication with Friends and Family: Three times a week    Frequency of Social Gatherings with Friends and Family: Three times a week    Attends Religious Services: More than 4 times per year    Active Member of Clubs or Organizations: No    Attends Banker Meetings: Never    Marital  Status: Divorced    Tobacco Counseling Counseling given: No   Clinical Intake:  Pre-visit preparation completed: No  Pain : No/denies pain     Nutritional Status: BMI of 19-24  Normal Nutritional Risks: None Diabetes: No  How often do you need to have someone help you when you read instructions, pamphlets, or other written materials from your doctor or pharmacy?: 5 - Always  Diabetic?No  Interpreter Needed?: No  Information entered by :: Laurel Dimmer, CMA   Activities of Daily Living    10/03/2022    3:37 PM 12/15/2021    7:23 AM  In your present state of health, do you have any difficulty performing the following activities:  Hearing? 0 0  Vision? 1 1  Difficulty concentrating or making decisions? 0 0  Walking or climbing stairs? 1 0  Dressing or bathing? 0 0  Doing errands, shopping? 1     Patient Care Team: Westley Chandler, MD as PCP - General (Family Medicine)  Indicate any recent Medical Services you may have received from other than [facility] providers in the past year (date may be approximate).     Assessment:   This is a routine wellness examination for Allison Steele.   Hearing/Vision screen Denies any hearing issues. Vision screening: Legally blind.  Leber's hereditary optic neuropathy - diagnosed at age 89. Loss of central vision bilaterally.         Dietary issues and exercise activities discussed:     Goals Addressed   None    Depression Screen    10/03/2022    3:36 PM 09/16/2022    8:29 AM 03/11/2022    8:50 AM 08/30/2021    8:46 AM 07/13/2021    9:57 AM 07/13/2021    9:54 AM 03/25/2021     1:53 PM  PHQ 2/9 Scores  PHQ - 2 Score 0 0 0 1 0 0 0  PHQ- 9 Score  2 0 2       Fall Risk    10/03/2022    3:36 PM 08/30/2021    8:23 AM 07/13/2021    9:57 AM 03/05/2021    8:29 AM 10/02/2020   10:17 AM  Fall Risk   Falls in the past year? 0 0 0 0 0  Number falls in past yr: 0 0 0 0 0  Injury with Fall? 0 0 0 0 0  Risk for fall due to : No Fall Risks  Impaired vision    Follow up Falls evaluation completed  Falls prevention discussed      FALL RISK PREVENTION PERTAINING TO THE HOME:  Any stairs in or around the home? No  If so, are there any without handrails? No  Home free of loose throw rugs in walkways, pet beds, electrical cords, etc? Yes Adequate lighting in your home to reduce risk of falls? Yes   ASSISTIVE DEVICES UTILIZED TO PREVENT FALLS:  Life alert? No  Use of a cane, walker or w/c? Yes  Grab bars in the bathroom? Yes  Shower chair or bench in shower? No  Elevated toilet seat or a handicapped toilet? No   TIMED UP AND GO:  Was the test performed?Unable to perform, virtual appointment   Cognitive Function:        10/03/2022    3:37 PM 07/13/2021    9:58 AM  6CIT Screen  What Year? 0 points 0 points  What month? 0 points 0 points  What time? 0 points 0 points  Count back from  20 0 points 0 points  Months in reverse 0 points 0 points  Repeat phrase 0 points 0 points  Total Score 0 points 0 points    Immunizations Immunization History  Administered Date(s) Administered   Hepatitis A, Adult 03/02/2018   Influenza Split 03/14/2011, 02/28/2012   Influenza Whole 03/27/2007, 03/07/2008, 03/18/2009, 03/17/2010   Influenza,inj,Quad PF,6+ Mos 05/29/2013, 02/20/2014, 05/06/2015, 02/23/2016, 02/28/2017, 03/02/2018, 02/21/2019, 02/14/2020, 03/05/2021, 03/11/2022   Moderna Sars-Covid-2 Vaccination 08/19/2019, 09/16/2019   PFIZER(Purple Top)SARS-COV-2 Vaccination 03/27/2020, 09/28/2020   Pfizer Covid-19 Vaccine Bivalent Booster 25yrs & up 03/26/2021   Td  08/04/1997, 08/26/2008   Tdap 03/03/2016   Zoster Recombinat (Shingrix) 08/31/2011, 10/31/2021    TDAP status: Up to date  Flu Vaccine status: Up to date  Pneumococcal vaccine status: Up to date  Covid-19 vaccine status: Information provided on how to obtain vaccines.   Qualifies for Shingles Vaccine? Yes   Zostavax completed Yes   Shingrix Completed?: Yes  Screening Tests Health Maintenance  Topic Date Due   COVID-19 Vaccine (6 - 2023-24 season) 02/04/2022   INFLUENZA VACCINE  01/05/2023   Medicare Annual Wellness (AWV)  10/03/2023   MAMMOGRAM  03/09/2024   PAP SMEAR-Modifier  12/30/2024   DTaP/Tdap/Td (4 - Td or Tdap) 03/03/2026   COLONOSCOPY (Pts 45-54yrs Insurance coverage will need to be confirmed)  12/01/2030   Hepatitis C Screening  Completed   HIV Screening  Completed   Zoster Vaccines- Shingrix  Completed   HPV VACCINES  Aged Out    Health Maintenance  Health Maintenance Due  Topic Date Due   COVID-19 Vaccine (6 - 2023-24 season) 02/04/2022    Colorectal cancer screening: Type of screening: Colonoscopy. Completed 11/30/2020. Repeat every 10 years  Mammogram status: Completed 03/09/2022. Repeat every year  DEXA Scan: not applicable   Lung Cancer Screening: (Low Dose CT Chest recommended if Age 63-80 years, 30 pack-year currently smoking OR have quit w/in 15years.) does not qualify.   Lung Cancer Screening Referral: not applicable   Additional Screening:  Hepatitis C Screening: does qualify; Completed 06/17/2019  Vision Screening: Recommended annual ophthalmology exams for early detection of glaucoma and other disorders of the eye. Is the patient up to date with their annual eye exam?No, legally blind    Who is the provider or what is the name of the office in which the patient attends annual eye exams?  If pt is not established with a provider, would they like to be referred to a provider to establish care? No .   Dental Screening: Recommended annual  dental exams for proper oral hygiene  Community Resource Referral / Chronic Care Management: CRR required this visit?  No   CCM required this visit?  No      Plan:     I have personally reviewed and noted the following in the patient's chart:   Medical and social history Use of alcohol, tobacco or illicit drugs  Current medications and supplements including opioid prescriptions. Patient is not currently taking opioid prescriptions. Functional ability and status Nutritional status Physical activity Advanced directives List of other physicians Hospitalizations, surgeries, and ER visits in previous 12 months Vitals Screenings to include cognitive, depression, and falls Referrals and appointments  In addition, I have reviewed and discussed with patient certain preventive protocols, quality metrics, and best practice recommendations. A written personalized care plan for preventive services as well as general preventive health recommendations were provided to patient.     Lonna Cobb, CMA   10/03/2022  Nurse Notes: Approximately 30 minute Non-Face -To-Face Medicare Wellness Visit

## 2022-10-13 ENCOUNTER — Telehealth: Payer: Self-pay

## 2022-10-13 NOTE — Progress Notes (Signed)
   10/13/2022  Patient ID: Allison Steele, female   DOB: July 29, 1970, 52 y.o.   MRN: [id number]    Allison Steele February 29, 1971 [id number] Patient outreached by Haze Boyden, PharmD Candidate and Frederic Jericho, PharmD Candidate on 10/13/2022.  Blood Pressure Readings: Last documented ambulatory systolic blood pressure: 145 Last documented ambulatory diastolic blood pressure: 90 Does the patient have a validated home blood pressure machine?: Yes They report home readings 145/90 on 10/12/2022.  Medication review was performed. Is the patient taking the medications as prescribed? No  Differences from their prescribed list include: Patient is taking 50 mg of losartan instead of 100 mg daily. She prefers once daily medications (takes all BP meds in the evening), but states she cannot take two 50 mg tablets of losartan together due to leg cramping.   The following barriers to adherence were noted: Does the patient have cost concerns?: No Does the patient have transportation concerns?: No Does the patient need assistance obtaining refills?: No Does the patient occassionally forget to take some of their prescribed medications?: No Does the patient feel like one/some of their medications make them feel poorly?: Yes (losartan causing leg cramping per patient) Does the patient have questions or concerns about their medications?: No Does the patient have a follow up scheduled with their primary care provider/cardiologist?: Yes   Interventions: Interventions Completed: Medications were reviewed, Patient was educated on proper technique to check home blood pressure and reminded to bring home machine and readings to next provider appointment, Patient was educated on medications, including indication and administration  The patient has follow up scheduled:  PCP: Westley Chandler, MD   Haze Boyden, Student-PharmD

## 2022-10-14 ENCOUNTER — Ambulatory Visit: Payer: Medicare Other | Admitting: Family Medicine

## 2022-10-25 ENCOUNTER — Other Ambulatory Visit: Payer: Self-pay | Admitting: *Deleted

## 2022-10-25 DIAGNOSIS — K219 Gastro-esophageal reflux disease without esophagitis: Secondary | ICD-10-CM

## 2022-10-25 MED ORDER — OMEPRAZOLE 20 MG PO CPDR: DELAYED_RELEASE_CAPSULE | ORAL | 11 refills | Status: DC

## 2022-12-01 NOTE — Progress Notes (Signed)
Cancelled visit

## 2022-12-02 ENCOUNTER — Ambulatory Visit (INDEPENDENT_AMBULATORY_CARE_PROVIDER_SITE_OTHER): Payer: Medicare Other | Admitting: Family Medicine

## 2022-12-02 DIAGNOSIS — I10 Essential (primary) hypertension: Secondary | ICD-10-CM

## 2023-01-03 ENCOUNTER — Ambulatory Visit: Payer: Medicare Other | Admitting: Obstetrics & Gynecology

## 2023-01-04 ENCOUNTER — Other Ambulatory Visit: Payer: Self-pay | Admitting: *Deleted

## 2023-01-04 DIAGNOSIS — F32A Depression, unspecified: Secondary | ICD-10-CM

## 2023-01-04 MED ORDER — BUPROPION HCL ER (SR) 150 MG PO TB12: 150.0000 mg | ORAL_TABLET | Freq: Two times a day (BID) | ORAL | 11 refills | Status: DC

## 2023-01-05 NOTE — Progress Notes (Signed)
    SUBJECTIVE:   CHIEF COMPLAINT: check up HPI:   Allison Steele is a 52 y.o.  with history notable for HTN and cutaneous sarcoidosis  presenting for follow up.  Patient has several concerns today  Patient reports she is not taking her blood pressure pills as prescribed.  Taking 2 of the losartan makes her have leg cramps.  She denies headaches, vision changes, chest pain or difficulty breathing.  She is amenable to increasing her spironolactone.  The patient reports that she was constipated a few weeks ago and a small amount of bright red blood on the tissue paper.  No bleeding since then.  She had a normal colonoscopy in 2022 with just hyperplastic polyp.  She denies alcohol use melena or recurrent hematochezia.  She denies symptoms of anemia including syncope.  The patient reports excess sweating.  She reports is mostly been a prominent summer with the heat.  She works in a hot facility.  She has tried multiple deodorants that do not work.  Even more concerning to her is that she cannot smell very well.  She had COVID twice and has had intermittent on and off anosmia since that time.  She denies severe headaches.  She does have a history of sarcoidosis but has been off her Plaquenil for some time.  PERTINENT  PMH / PSH/Family/Social History : Hypertension, Leber's hereditary optic neuropathy.  OBJECTIVE:   BP (!) 186/111   Pulse 89   Ht 5\' 3"  (1.6 m)   Wt 169 lb 12.8 oz (77 kg)   LMP 04/26/2017   SpO2 100%   BMI 30.08 kg/m   Today's weight:  Last Weight  Most recent update: 01/06/2023  8:43 AM    Weight  77 kg (169 lb 12.8 oz)            Review of prior weights: [employer]   01/06/23 0843  Weight: 169 lb 12.8 oz (77 kg)   Pleasant woman in no apparent distress regular rate and rhythm lungs are clear bilaterally abdomen is soft nontender nondistended.  ASSESSMENT/PLAN:   Essential hypertension Not at goal Increase spironolactone to 50 mg Follow up 2 weeks for  BMP and BP check   Hyperhidrosis Discussed triggers TSH at next visit with labs  Rx Drisol  Anosmia Suspect seasonal allergies although sarcoid could be culprit No headaches suggestive of space occupying lesion Flonase BID If not improving, refer to ENT as this is very distressing to her   Hematochezia X1 Suspect fissure or hemorrhoid CBC at follow up for BP   Referral to GI for colonoscopy given it has been 3 years and new symptoms     Terisa Starr, MD  Family Medicine Teaching Service  [facility]

## 2023-01-06 ENCOUNTER — Encounter: Payer: Self-pay | Admitting: Family Medicine

## 2023-01-06 ENCOUNTER — Ambulatory Visit (INDEPENDENT_AMBULATORY_CARE_PROVIDER_SITE_OTHER): Payer: Medicare Other | Admitting: Family Medicine

## 2023-01-06 VITALS — BP 186/111 | HR 89 | Ht 63.0 in | Wt 169.8 lb

## 2023-01-06 DIAGNOSIS — I10 Essential (primary) hypertension: Secondary | ICD-10-CM

## 2023-01-06 DIAGNOSIS — K625 Hemorrhage of anus and rectum: Secondary | ICD-10-CM | POA: Diagnosis not present

## 2023-01-06 DIAGNOSIS — F32A Depression, unspecified: Secondary | ICD-10-CM | POA: Diagnosis not present

## 2023-01-06 DIAGNOSIS — H4722 Hereditary optic atrophy: Secondary | ICD-10-CM

## 2023-01-06 MED ORDER — LOSARTAN POTASSIUM 50 MG PO TABS
50.0000 mg | ORAL_TABLET | Freq: Every day | ORAL | 3 refills | Status: DC
Start: 2023-01-06 — End: 2023-03-02

## 2023-01-06 MED ORDER — BUPROPION HCL ER (SR) 150 MG PO TB12: 150.0000 mg | ORAL_TABLET | Freq: Two times a day (BID) | ORAL | 11 refills | Status: DC

## 2023-01-06 MED ORDER — FLUTICASONE PROPIONATE 50 MCG/ACT NA SUSP: 2.0000 | Freq: Every day | NASAL | 6 refills | Status: DC

## 2023-01-06 MED ORDER — DRYSOL 20 % EX SOLN
Freq: Every day | CUTANEOUS | 0 refills | Status: DC
Start: 2023-01-06 — End: 2023-02-09

## 2023-01-06 MED ORDER — SPIRONOLACTONE 50 MG PO TABS
50.0000 mg | ORAL_TABLET | Freq: Every day | ORAL | 3 refills | Status: DC
Start: 2023-01-06 — End: 2023-03-10

## 2023-01-06 NOTE — Assessment & Plan Note (Signed)
Not at goal Increase spironolactone to 50 mg Follow up 2 weeks for BMP and BP check

## 2023-01-06 NOTE — Patient Instructions (Addendum)
It was wonderful to see you today.  Please bring ALL of your medications with you to every visit.   Today we talked about:   FOR YOUR SMELLING ISSUES WITH YOUR NOSE - Flonase 1 spray in each nostril TWICE a day for the next month EVERY SINGLE DAY   For your sweating - Try 100% Cotton or linen clothing - Wear light colors - Consider a fan in your room - I sent in Drysol--let me know if this is not covered--use this at night  For your blood pressure THANK YOU for telling me! I recommend increasing your spironolactone to 50 mg  Please follow up in 2-3 weeks    Thank you for choosing [facility].   Please call [phone] with any questions about today's appointment.  Please be sure to schedule follow up at the front  desk before you leave today.   Terisa Starr, MD  Family Medicine

## 2023-01-26 NOTE — Progress Notes (Signed)
    SUBJECTIVE:   CHIEF COMPLAINT: follow up BP HPI:   Allison Steele is a 52 y.o.  with history notable for HTN and Leber's hereditary optic neuropathy presenting for follow up.   PERTINENT  PMH / PSH/Family/Social History : ***  OBJECTIVE:   LMP 04/26/2017   Today's weight:  Review of prior weights: There were no vitals filed for this visit.  ***  ASSESSMENT/PLAN:   No problem-specific Assessment & Plan notes found for this encounter.     {    This will disappear when note is signed, click to select method of visit    :1}  Terisa Starr, MD  Family Medicine Teaching Service  [facility]

## 2023-01-27 ENCOUNTER — Ambulatory Visit: Payer: Medicare Other | Admitting: Family Medicine

## 2023-01-27 ENCOUNTER — Telehealth: Payer: Self-pay

## 2023-01-27 NOTE — Progress Notes (Signed)
 Patient attempted to be outreached by Erasmo Leventhal, PharmD Candidate to discuss hypertension. Left voicemail for patient to return our call at their convenience at [phone]

## 2023-01-30 ENCOUNTER — Encounter (INDEPENDENT_AMBULATORY_CARE_PROVIDER_SITE_OTHER): Payer: Medicare Other | Admitting: Family Medicine

## 2023-01-30 DIAGNOSIS — I10 Essential (primary) hypertension: Secondary | ICD-10-CM

## 2023-01-30 DIAGNOSIS — R61 Generalized hyperhidrosis: Secondary | ICD-10-CM

## 2023-02-07 ENCOUNTER — Encounter: Payer: Self-pay | Admitting: Pharmacist

## 2023-02-07 NOTE — Progress Notes (Unsigned)
    SUBJECTIVE:   CHIEF COMPLAINT: BP HPI:   Allison Steele is a 52 y.o.  with history notable for HTN, Leber's herediatry optic neuropathy and cutaneous sarcoid presenting for BP check.   PERTINENT  PMH / PSH/Family/Social History :  Reviewed- nothing new  OBJECTIVE:   LMP 04/26/2017   Today's weight:  Review of prior weights: There were no vitals filed for this visit.  ***  ASSESSMENT/PLAN:   No problem-specific Assessment & Plan notes found for this encounter.     {    This will disappear when note is signed, click to select method of visit    :1}  Terisa Starr, MD  Family Medicine Teaching Service  [facility]

## 2023-02-09 ENCOUNTER — Encounter: Payer: Self-pay | Admitting: Family Medicine

## 2023-02-09 ENCOUNTER — Other Ambulatory Visit: Payer: Self-pay

## 2023-02-09 ENCOUNTER — Ambulatory Visit: Payer: Medicare Other | Admitting: Family Medicine

## 2023-02-09 VITALS — BP 154/82 | HR 79 | Ht 63.0 in | Wt 171.0 lb

## 2023-02-09 DIAGNOSIS — I10 Essential (primary) hypertension: Secondary | ICD-10-CM

## 2023-02-09 MED ORDER — FLUTICASONE PROPIONATE 50 MCG/ACT NA SUSP: 2.0000 | Freq: Every day | NASAL | 6 refills | Status: DC

## 2023-02-09 MED ORDER — POLYETHYLENE GLYCOL 3350 17 GM/SCOOP PO POWD: 17.0000 g | Freq: Two times a day (BID) | ORAL | 1 refills | Status: DC | PRN

## 2023-02-09 MED ORDER — DRYSOL 20 % EX SOLN: Freq: Every day | CUTANEOUS | 2 refills | Status: AC

## 2023-02-09 NOTE — Assessment & Plan Note (Signed)
Not at goal.  Scheduled for ambulatory blood pressure follow-up next week.  After her ambulatory cuff measurement please schedule her back with me sometime in October pending which medications are changed or added.

## 2023-02-09 NOTE — Patient Instructions (Signed)
It was wonderful to see you today.  Please bring ALL of your medications with you to every visit.   Today we talked about:  - If you eat cheese, take 1 capful of miralax in a glass of water  - I sent in your nasal spray and sweat medication  - Goal: Start going to the Gym on Monday 02/13/2023 Go twice a week Buy a new swim suit  Follow up next Wednesday with Dr. Raymondo Band for a 24 hour Blood pressure    Please follow up in 1 months   Thank you for choosing [facility].   Please call [phone] with any questions about today's appointment.  Please be sure to schedule follow up at the front  desk before you leave today.   Terisa Starr, MD  Family Medicine

## 2023-02-10 LAB — BASIC METABOLIC PANEL
BUN/Creatinine Ratio: 12 (ref 9–23)
BUN: 9 mg/dL (ref 6–24)
CO2: 22 mmol/L (ref 20–29)
Calcium: 9.4 mg/dL (ref 8.7–10.2)
Chloride: 103 mmol/L (ref 96–106)
Creatinine, Ser: 0.75 mg/dL (ref 0.57–1.00)
Glucose: 95 mg/dL (ref 70–99)
Potassium: 4.3 mmol/L (ref 3.5–5.2)
Sodium: 138 mmol/L (ref 134–144)
eGFR: 96 mL/min/{1.73_m2} (ref >59)

## 2023-02-10 LAB — TSH RFX ON ABNORMAL TO FREE T4: TSH: 1.47 u[IU]/mL (ref 0.450–4.500)

## 2023-02-13 ENCOUNTER — Other Ambulatory Visit: Payer: Self-pay | Admitting: Family Medicine

## 2023-02-13 DIAGNOSIS — Z1231 Encounter for screening mammogram for malignant neoplasm of breast: Secondary | ICD-10-CM

## 2023-02-14 ENCOUNTER — Encounter: Payer: Self-pay | Admitting: Pharmacist

## 2023-02-14 ENCOUNTER — Ambulatory Visit (INDEPENDENT_AMBULATORY_CARE_PROVIDER_SITE_OTHER): Payer: Medicare Other | Admitting: Pharmacist

## 2023-02-14 VITALS — BP 164/112 | Ht 63.5 in | Wt 169.4 lb

## 2023-02-14 DIAGNOSIS — I10 Essential (primary) hypertension: Secondary | ICD-10-CM

## 2023-02-14 NOTE — Patient Instructions (Signed)
Blood Pressure Activity Diary Time Lying down/ Sleeping Walking/ Exercise Stressed/ Angry Headache/ Pain Dizzy  9 AM       10 AM       11 AM       12 PM       1 PM       2 PM       Time Lying down/ Sleeping Walking/ Exercise Stressed/ Angry Headache/ Pain Dizzy  3 PM       4 PM        5 PM       6 PM       7 PM       8 PM       Time Lying down/ Sleeping Walking/ Exercise Stressed/ Angry Headache/ Pain Dizzy  9 PM       10 PM       11 PM       12 AM       1 AM       2 AM       3 AM       Time Lying down/ Sleeping Walking/ Exercise Stressed/ Angry Headache/ Pain Dizzy  4 AM       5 AM       6 AM       7 AM       8 AM       9 AM       10 AM        Time you woke up: _________                  Time you went to sleep:__________  Come back tomorrow at 8:30 a.m. to have the monitor removed  Call the [facility] if you have any questions before then [phone])  Wearing the Blood Pressure Monitor The cuff will inflate every 20 minutes during the day and every 30 minutes while you sleep. Fill out the blood pressure-activity diary during the day, especially during activities that may affect your reading -- such as exercise, stress, walking, taking your blood pressure medications  Important things to know: Avoid taking the monitor off for the next 24 hours, unless it causes you discomfort or pain. Do NOT get the monitor wet and do NOT try to clean the monitor with any cleaning products. Do NOT put the monitor on anyone else's arm. When the cuff inflates, avoid excess movement. Let the cuffed arm hang loosely, slightly away from the body. Avoid flexing the muscles or moving the hand/fingers. Remember to fill out the blood pressure activity diary. If you experience severe pain or unusual pain (not associated with getting your blood pressure checked), remove the monitor.  Troubleshooting:  Code  Troubleshooting   1  Check cuff position, tighten cuff   2, 3  Remain  still during reading   4, 87  Check air hose connections and make sure cuff is tight   85, 89  Check hose connections and make tubing is not crimped   86  Push START/STOP to restart reading   88, 91  Retry by pushing START/STOP   90  Replace batteries. If problem persists, remove monitor and bring back to   clinic at follow up   97, 98, 99  Service required - Remove monitor and bring back to clinic at follow up

## 2023-02-14 NOTE — Progress Notes (Signed)
Reviewed and agree with Dr Koval's plan.   

## 2023-02-14 NOTE — Progress Notes (Signed)
S:     Chief Complaint  Patient presents with   Medication Management    Ambulatory BP Monitoring Day 1   52 y.o. female who presents for hypertension evaluation, education, and management. Patient arrives in good spirits and presents without any assistance.   PMH is significant for hyperhidrosis, anosmia, and hematochezia.  Patient was referred and last seen by Primary Care Provider, Dr. Manson Passey, on 01/06/23.   No official diagnosis for hypertension.    Medication compliance is reported to be good.  Discussed procedure for wearing the monitor and gave patient written instructions. Monitor was placed on non-dominant arm with instructions to return in the morning.   Current BP Medications include:  amlodipine 5 mg once daily, losartan 50 mg once daily, spironolactone 50 mg once daily  Antihypertensives tried in the past include: lisinopril-hydrochlorothiazide 20-25 mg - patient reported not being able to tolerate this due to increased urination  O:  Review of Systems  All other systems reviewed and are negative.   Physical Exam Constitutional:      Appearance: Normal appearance.  Pulmonary:     Effort: Pulmonary effort is normal.  Neurological:     Mental Status: She is alert.  Psychiatric:        Mood and Affect: Mood normal.        Behavior: Behavior normal.        Thought Content: Thought content normal.        Judgment: Judgment normal.     Last 3 Office BP readings: BP Readings from Last 3 Encounters:  02/14/23 (!) 164/112  02/09/23 (!) 154/82  01/06/23 (!) 186/111    Clinical Atherosclerotic Cardiovascular Disease (ASCVD): No  The 10-year ASCVD risk score (Arnett DK, et al., 2019) is: 7%   Values used to calculate the score:     Age: 84 years     Sex: Female     Is Non-Hispanic African American: Yes     Diabetic: No     Tobacco smoker: No     Systolic Blood Pressure: 164 mmHg     Is BP treated: Yes     HDL Cholesterol: 66 mg/dL     Total Cholesterol:  181 mg/dL  Basic Metabolic Panel    Component Value Date/Time   NA 138 02/09/2023 1213   K 4.3 02/09/2023 1213   CL 103 02/09/2023 1213   CO2 22 02/09/2023 1213   GLUCOSE 95 02/09/2023 1213   GLUCOSE 117 (H) 12/09/2021 1523   BUN 9 02/09/2023 1213   CREATININE 0.75 02/09/2023 1213   CREATININE 0.81 09/10/2015 1440   CALCIUM 9.4 02/09/2023 1213   GFRNONAA >60 12/09/2021 1523   GFRNONAA 89 09/10/2015 1440   GFRAA 95 02/14/2020 1335   GFRAA >89 09/10/2015 1440    Renal function: Estimated Creatinine Clearance: 82.6 mL/min (by C-G formula based on SCr of 0.75 mg/dL).   ABPM Study Data: Arm Placement left arm  For Office Goal BP of <130/80 mmHg:  ABPM thresholds: Overall BP <125/75 mmHg, daytime BP <130/80 mmHg, sleeptime BP <110/65 mmHg   A/P: History of blood pressure elevations since 2008.  Patient unaware of when she has been diagnosed; goal presssure of <130/80 mm Hg.     -Placed blood pressure cuff, provided education, patient instructed to wear cuff for 24 hours and return tomorrow to review results.   Written patient instructions provided including activity/symptom/event log. Patient verbalized understanding of plan. Total time in face to face counseling 20 minutes.  Follow-up: Tomorrow AM - early morning appointment 8:30 a.m.  Patient seen with Alesia Banda, PharmD Candidate and Andee Poles, PharmD Candidate.

## 2023-02-14 NOTE — Assessment & Plan Note (Signed)
History of blood pressure elevations since 2008.  Patient unaware of when she has been diagnosed; goal presssure of <130/80 mm Hg.     -Placed blood pressure cuff, provided education, patient instructed to wear cuff for 24 hours and return tomorrow to review results.

## 2023-02-15 ENCOUNTER — Encounter: Payer: Self-pay | Admitting: Pharmacist

## 2023-02-15 ENCOUNTER — Ambulatory Visit (INDEPENDENT_AMBULATORY_CARE_PROVIDER_SITE_OTHER): Payer: Medicare Other | Admitting: Pharmacist

## 2023-02-15 VITALS — BP 142/93 | HR 89

## 2023-02-15 DIAGNOSIS — I10 Essential (primary) hypertension: Secondary | ICD-10-CM | POA: Diagnosis not present

## 2023-02-15 MED ORDER — AMLODIPINE BESYLATE 10 MG PO TABS
ORAL_TABLET | ORAL | 3 refills | Status: DC
Start: 2023-02-15 — End: 2023-03-02

## 2023-02-15 NOTE — Patient Instructions (Signed)
It was nice to see you today!  Thank you for completing the blood pressure monitoring evaluation.  Your goal blood pressure is < 130/80 mmHg   Medication Changes: Increase Amlodipine - take 2 tablets of 5 mg until gone. THEN take 1 tablet (10 mg) daily.  Continue all other medication the same.  Monitor blood pressure at home and keep a log (on a piece of paper) to bring with you to your next visit.

## 2023-02-15 NOTE — Progress Notes (Signed)
S:     Chief Complaint  Patient presents with   Medication Management    AMB BP Day 2   52 y.o. female who presents for hypertension evaluation, education, and management.  Patient arrives in good spirits and presents without any assistance.  PMH is significant for HTN, hyperhidrosis, anosmia, and hematochezia. Patient returns to clinic with 24 hour blood pressure monitor and reports her blood pressure was high due to stress at various points of the day. Patient reports they were able to wear the Ambulatory Blood Pressure Cuff for the entire 24 evaluation period.   O:  Review of Systems  All other systems reviewed and are negative.   Physical Exam Vitals reviewed.  Constitutional:      Appearance: Normal appearance.  Pulmonary:     Effort: Pulmonary effort is normal.  Musculoskeletal:     Right lower leg: No edema.     Left lower leg: No edema.  Neurological:     Mental Status: She is alert.  Psychiatric:        Mood and Affect: Mood normal.        Behavior: Behavior normal.        Thought Content: Thought content normal.        Judgment: Judgment normal.     Last 3 Office BP readings: BP Readings from Last 3 Encounters:  02/14/23 (!) 164/112  02/09/23 (!) 154/82  01/06/23 (!) 186/111    Clinical Atherosclerotic Cardiovascular Disease (ASCVD): No  The 10-year ASCVD risk score (Arnett DK, et al., 2019) is: 7%   Values used to calculate the score:     Age: 55 years     Sex: Female     Is Non-Hispanic African American: Yes     Diabetic: No     Tobacco smoker: No     Systolic Blood Pressure: 164 mmHg     Is BP treated: Yes     HDL Cholesterol: 66 mg/dL     Total Cholesterol: 181 mg/dL  Basic Metabolic Panel    Component Value Date/Time   NA 138 02/09/2023 1213   K 4.3 02/09/2023 1213   CL 103 02/09/2023 1213   CO2 22 02/09/2023 1213   GLUCOSE 95 02/09/2023 1213   GLUCOSE 117 (H) 12/09/2021 1523   BUN 9 02/09/2023 1213   CREATININE 0.75 02/09/2023  1213   CREATININE 0.81 09/10/2015 1440   CALCIUM 9.4 02/09/2023 1213   GFRNONAA >60 12/09/2021 1523   GFRNONAA 89 09/10/2015 1440   GFRAA 95 02/14/2020 1335   GFRAA >89 09/10/2015 1440    Renal function: Estimated Creatinine Clearance: 82.6 mL/min (by C-G formula based on SCr of 0.75 mg/dL).   ABPM Study Data: Arm Placement left arm  Overall Mean 24hr BP:   136/87 mmHg  HR: 84  Daytime Mean BP:  142/93 mmHg  HR: 89  Nighttime Mean BP:  121/73 mmHg  HR: 75  Dipping Pattern: Yes.    Sys:   15.1   Dia: 21.4   [normal dipping ~10-20%]   For Office Goal BP of <130/80 mmHg:  ABPM thresholds: Overall BP <125/75 mmHg, daytime BP <130/80 mmHg, sleeptime BP <110/65 mmHg    A/P: History of elevated blood pressure longstanding, recently confirmed diagnosis of hypertension; with goal presssure of <130/80 mmHg. Found to have persistently elevated and uncontrolled blood pressure with 24-hour ambulatory blood pressure evaluation which demonstrates an average AWAKE blood pressure of 142/93 mmHg.  Nocturnal dipping pattern is normal.   Changes  to medications -Increased dose of amlodipine from 5 mg to 10 mg. Patient will take 2 tables of 5 mg until gone. Then will pick up her prescription for the 10 mg daily. -Continued losartan 50 mg daily (unable to tolerate at higher doses) -Continued spironolactone 50 mg daily  Results reviewed and written information provided.    Written patient instructions provided. Patient verbalized understanding of treatment plan.  Total time in face to face counseling 16 minutes.    Follow-up:  Pharmacist 2 weeks PCP clinic visit PRN  Patient seen with Alesia Banda, PharmD Candidate.

## 2023-02-15 NOTE — Assessment & Plan Note (Signed)
History of elevated blood pressure longstanding, recently confirmed diagnosis of hypertension; with goal presssure of <130/80 mmHg. Found to have persistently elevated and uncontrolled blood pressure with 24-hour ambulatory blood pressure evaluation which demonstrates an average AWAKE blood pressure of 142/93 mmHg.  Nocturnal dipping pattern is normal.   Changes to medications -Increased dose of amlodipine from 5 mg to 10 mg. Patient will take 2 tables of 5 mg until gone. Then will pick up her prescription for the 10 mg daily. -Continued losartan 50 mg daily (unable to tolerate at higher doses) -Continued spironolactone 50 mg daily

## 2023-02-17 NOTE — Progress Notes (Signed)
Reviewed and agree with Dr Koval's plan.   

## 2023-03-02 ENCOUNTER — Ambulatory Visit: Payer: Medicare Other | Admitting: Pharmacist

## 2023-03-02 ENCOUNTER — Encounter: Payer: Self-pay | Admitting: Pharmacist

## 2023-03-02 VITALS — BP 149/95 | HR 85 | Wt 169.6 lb

## 2023-03-02 DIAGNOSIS — I10 Essential (primary) hypertension: Secondary | ICD-10-CM

## 2023-03-02 DIAGNOSIS — Z23 Encounter for immunization: Secondary | ICD-10-CM | POA: Diagnosis not present

## 2023-03-02 MED ORDER — OLMESARTAN-AMLODIPINE-HCTZ 40-10-12.5 MG PO TABS: 1.0000 | ORAL_TABLET | Freq: Every day | ORAL | 3 refills | Status: DC

## 2023-03-02 NOTE — Progress Notes (Signed)
Reviewed and agree with Dr Koval's plan.   

## 2023-03-02 NOTE — Progress Notes (Signed)
S:     Chief Complaint  Patient presents with   Medication Management    HTN   52 y.o. female who presents for hypertension evaluation, education, and management. Today, patient arrives in positive spirits and presents without assistance. Denies dizziness, headache, swelling. 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  PMH is significant for HTN, hyperhidrosis, anosmia, and hematochezia. .   Patient was last seen by pharmacy on 02/15/23 for ambulatory BP cuff monitoring and official diagnosis of hypertension. At last visit, increased dose of amlodipine from 5 mg to 10 mg. Continued losartan and spironolactone both at 50 mg daily.  Medication adherence optimal. Patient has not taken BP medications today, takes them at night.   Current antihypertensives include: amlodipine 10 mg daily, losartan 50 mg daily, spironolactone 50 mg daily  Antihypertensives tried in the past include: lisinopril-hydrochlorothiazide 20-25 mg - patient reported not being able to tolerate this due to increased urination and cramping in legs  Reported home BP readings: systolic 130-140s, diastolic 80-90s  Patient brought in wrist BP monitor, reading 169/115: believe this monitor may be inaccurate  O:  Review of Systems  All other systems reviewed and are negative.   Physical Exam Constitutional:      Appearance: Normal appearance.  Neurological:     Mental Status: She is alert.  Psychiatric:        Mood and Affect: Mood normal.        Behavior: Behavior normal.        Thought Content: Thought content normal.        Judgment: Judgment normal.    Last 3 Office BP readings: BP Readings from Last 3 Encounters:  03/02/23 (!) 149/95  02/15/23 (!) 142/93  02/14/23 (!) 164/112    BMET    Component Value Date/Time   NA 138 02/09/2023 1213   K 4.3 02/09/2023 1213   CL 103 02/09/2023 1213   CO2 22 02/09/2023 1213   GLUCOSE 95 02/09/2023 1213    GLUCOSE 117 (H) 12/09/2021 1523   BUN 9 02/09/2023 1213   CREATININE 0.75 02/09/2023 1213   CREATININE 0.81 09/10/2015 1440   CALCIUM 9.4 02/09/2023 1213   GFRNONAA >60 12/09/2021 1523   GFRNONAA 89 09/10/2015 1440   GFRAA 95 02/14/2020 1335   GFRAA >89 09/10/2015 1440    Renal function: Estimated Creatinine Clearance: 82.6 mL/min (by C-G formula based on SCr of 0.75 mg/dL).  Clinical ASCVD: No  The 10-year ASCVD risk score (Arnett DK, et al., 2019) is: 4.8%   Values used to calculate the score:     Age: 75 years     Sex: Female     Is Non-Hispanic African American: Yes     Diabetic: No     Tobacco smoker: No     Systolic Blood Pressure: 149 mmHg     Is BP treated: Yes     HDL Cholesterol: 66 mg/dL     Total Cholesterol: 181 mg/dL  A/P: Hypertension diagnosed recently currently uncontrolled on current medications. BP goal < 130/80 mmHg. Medication adherence appears optimal. Control is suboptimal due to warranting of additional antihypertensives. At next visit, will discuss addition of carvedilol due to elevated HR. Patient happy with reducing pill burden by switching to combination therapy.  -Started olmesartan-amlodipine-hydrochlorothiazide 40-10-12.5 mg daily. Sent new prescription to pharmacy. -Discontinued losartan 50 mg daily and amlodipine separate pills.  -Continued spironolactone 50 mg daily. -Patient educated on purpose, proper use, and  potential adverse effects.  -Counseled on lifestyle modifications for blood pressure control including reduced dietary sodium, increased exercise, adequate sleep. -Encouraged patient to check BP at home and bring log of readings to next visit. Counseled on proper use of home BP cuff.  - BMET next appointment  Immunization - Patient requested influenza vaccination - Administered by nurse  Results reviewed and written information provided.    Written patient instructions provided. Patient verbalized understanding of treatment plan.   Total time in face to face counseling 25 minutes.    Follow-up:  Pharmacist March 21, 2023. PCP clinic visit in PRN.  Patient seen with Alesia Banda, PharmD Candidate.

## 2023-03-02 NOTE — Patient Instructions (Addendum)
It was nice to see you today!  Your goal blood pressure is <130/80 mmHg.  Medication Changes: START olmesartan-amlodipine-hydrochlorothiazide 40-10-12.5 mg daily. Sent new prescription to pharmacy.  Discontinue losartan 50 mg daily and amlodipine daily.   Continue spironolactone 50 mg daily.  Bring back all medications at next appointment.  Continue all other medication the same.  Monitor blood pressure at home daily and keep a log (on your phone or piece of paper) to bring with you to your next visit. Write down date, time, blood pressure and pulse.  Keep up the good work with diet and exercise. Aim for a diet full of vegetables, fruit and lean meats (chicken, [place], fish). Try to limit salt intake by eating fresh or frozen vegetables (instead of canned), rinse canned vegetables prior to cooking and do not add any additional salt to meals.

## 2023-03-02 NOTE — Assessment & Plan Note (Signed)
Hypertension diagnosed recently currently uncontrolled on current medications. BP goal < 130/80 mmHg. Medication adherence appears optimal. Control is suboptimal due to warranting of additional antihypertensives. At next visit, will discuss addition of carvedilol due to elevated HR. Patient happy with reducing pill burden by switching to combination therapy.  -Started olmesartan-amlodipine-hydrochlorothiazide 40-10-12.5 mg daily. Sent new prescription to pharmacy. -Discontinued losartan 50 mg daily and amlodipine separate pills.  -Continued spironolactone 50 mg daily. -Patient educated on purpose, proper use, and potential adverse effects.  -Counseled on lifestyle modifications for blood pressure control including reduced dietary sodium, increased exercise, adequate sleep. -Encouraged patient to check BP at home and bring log of readings to next visit. Counseled on proper use of home BP cuff.  - BMET next appointment

## 2023-03-06 ENCOUNTER — Ambulatory Visit: Payer: Medicare Other | Admitting: Pulmonary Disease

## 2023-03-07 ENCOUNTER — Ambulatory Visit: Payer: Medicare Other | Admitting: Nurse Practitioner

## 2023-03-10 ENCOUNTER — Other Ambulatory Visit: Payer: Self-pay

## 2023-03-10 DIAGNOSIS — I10 Essential (primary) hypertension: Secondary | ICD-10-CM

## 2023-03-10 MED ORDER — SPIRONOLACTONE 50 MG PO TABS
50.0000 mg | ORAL_TABLET | Freq: Every day | ORAL | 3 refills | Status: DC
Start: 2023-03-10 — End: 2023-04-28

## 2023-03-13 ENCOUNTER — Ambulatory Visit
Admission: RE | Admit: 2023-03-13 | Discharge: 2023-03-13 | Disposition: A | Discharge status: Home / Self Care | Payer: Medicare Other | Source: Ambulatory Visit | Attending: Family Medicine | Admitting: Family Medicine

## 2023-03-13 DIAGNOSIS — Z1231 Encounter for screening mammogram for malignant neoplasm of breast: Secondary | ICD-10-CM

## 2023-03-16 ENCOUNTER — Encounter: Payer: Self-pay | Admitting: Family Medicine

## 2023-03-21 ENCOUNTER — Ambulatory Visit: Payer: Medicare Other | Admitting: Pharmacist

## 2023-03-21 ENCOUNTER — Encounter: Payer: Self-pay | Admitting: Pharmacist

## 2023-03-21 VITALS — BP 146/83 | HR 88 | Wt 170.2 lb

## 2023-03-21 DIAGNOSIS — I10 Essential (primary) hypertension: Secondary | ICD-10-CM | POA: Diagnosis not present

## 2023-03-21 DIAGNOSIS — Z23 Encounter for immunization: Secondary | ICD-10-CM

## 2023-03-21 NOTE — Progress Notes (Signed)
S:     Chief Complaint  Patient presents with   Medication Management    Blood Pressure f/u    52 y.o. female who presents for hypertension evaluation, education, and management.  PMH is significant for hypertension.  Patient was referred and last seen by Primary Care Provider, Dr. Manson Passey, on 02/09/2023.   At last visit, BP 149/95 and Olmesartan-amlodipine-hydrochlorothiazide 40-10-12.5 mg was started.   Today, patient arrives in good spirits and presents without assistance. Denies dizziness, headache, blurred vision, swelling. Complains of cramps at night  Patient reports hypertension was diagnosed in 2008.   Family/Social history: n/a  Medication adherence appears good. Patient has  taken BP medications today.   Current antihypertensives include: olmesartan-amlodipine-hydrochlorothiazide, spironolactone  Antihypertensives tried in the past include: amlodipine, lisinopril-hydrochlorothiazide, losartan  Reported home BP readings: 132/84, 124/83, 104/78(she had a picture of this), 114/77, 120/84, 120/82  Patient reported dietary habits: Eats 2-3 meals/day  Patient-reported exercise habits: Walk in church parking lot, 2 times a week about 20-30 minutes  O:  ROS  Physical Exam Constitutional:      Appearance: Normal appearance.  Pulmonary:     Effort: Pulmonary effort is normal.  Neurological:     Mental Status: She is alert.  Psychiatric:        Mood and Affect: Mood normal.        Behavior: Behavior normal.        Thought Content: Thought content normal.        Judgment: Judgment normal.     Last 3 Office BP readings: BP Readings from Last 3 Encounters:  03/21/23 (!) 146/83  03/02/23 (!) 149/95  02/15/23 (!) 142/93    BMET    Component Value Date/Time   NA 138 02/09/2023 1213   K 4.3 02/09/2023 1213   CL 103 02/09/2023 1213   CO2 22 02/09/2023 1213   GLUCOSE 95 02/09/2023 1213   GLUCOSE 117 (H) 12/09/2021 1523   BUN 9 02/09/2023 1213   CREATININE  0.75 02/09/2023 1213   CREATININE 0.81 09/10/2015 1440   CALCIUM 9.4 02/09/2023 1213   GFRNONAA >60 12/09/2021 1523   GFRNONAA 89 09/10/2015 1440   GFRAA 95 02/14/2020 1335   GFRAA >89 09/10/2015 1440    Renal function: CrCl cannot be calculated (Patient's most recent lab result is older than the maximum 21 days allowed.).   A/P: Hypertension diagnosed 2008 currently on multiple medications and control of home readings appears optimal. In-office readings are higher with both clinic device and home monitor showing similar values.  BP goal < 130/80 mmHg. Medication adherence appears good. Home BP readings appear at goal. In-office control is suboptimal with reading today of 128/90 possibly related to "White-Coat" issue.  -Patient educated on purpose, proper use, and potential adverse effects(dry mouth) of olmesartan -amlodipine- hydrochlorothiazide.  -Mentioned concerns with Olm-Aml-hydrochlorothiazide pricing at $47/month with insurance. Patient ineligible for manufacturer coupon because of Medicare coverage, GoodRX coupon provided for $35/month.  -Counseled on lifestyle modifications for blood pressure control including reduced dietary sodium, increased exercise, adequate sleep. -Encouraged patient to check BP 2-4 times a week at home and bring log of readings to next visit. Counseled on proper use of home BP cuff.  Continue olmesartan-amlodipine-hydrochlorothiazide, spironolactone at this time.   Results reviewed and written information provided.    Written patient instructions provided. Patient verbalized understanding of treatment plan.  Total time in face to face counseling 27 minutes.    Follow-up:  PCP clinic visit with Dr. Manson Passey, MD on  04/16/2024.  Patient seen with Caprice Beaver, PharmD Candidate and Shona Simpson, PharmD Candidate.

## 2023-03-21 NOTE — Assessment & Plan Note (Signed)
Hypertension diagnosed 2008 currently on multiple medications and control of home readings appears optimal. In-office readings are higher with both clinic device and home monitor showing similar values.  BP goal < 130/80 mmHg. Medication adherence appears good. Home BP readings appear at goal. In-office control is suboptimal with reading today of 128/90 possibly related to "White-Coat" issue.  -Patient educated on purpose, proper use, and potential adverse effects(dry mouth) of olmesartan -amlodipine- hydrochlorothiazide.  -Mentioned concerns with Olm-Aml-hydrochlorothiazide pricing at $47/month with insurance. Patient ineligible for manufacturer coupon because of Medicare coverage, GoodRX coupon provided for $35/month.  -Counseled on lifestyle modifications for blood pressure control including reduced dietary sodium, increased exercise, adequate sleep. -Encouraged patient to check BP 2-4 times a week at home and bring log of readings to next visit. Counseled on proper use of home BP cuff.  Continue olmesartan-amlodipine-hydrochlorothiazide, spironolactone at this time.

## 2023-03-21 NOTE — Patient Instructions (Signed)
It was nice to see you today!  Your goal blood pressure is <130/80   mmHg.  Medication Changes:  Continue all medication the same.   Monitor blood pressure at home 2-4 times a week and keep a log (on your phone or piece of paper) to bring with you to your next visit. Write down date, time, blood pressure and pulse.  Keep up the good work with diet and exercise. Aim for a diet full of vegetables, fruit and lean meats (chicken, [place], fish). Try to limit salt intake by eating fresh or frozen vegetables (instead of canned), rinse canned vegetables prior to cooking and do not add any additional salt to meals.

## 2023-03-21 NOTE — Progress Notes (Signed)
Reviewed and agree with Dr Koval's plan.   

## 2023-04-12 ENCOUNTER — Ambulatory Visit: Payer: Medicare Other | Admitting: Pulmonary Disease

## 2023-04-17 ENCOUNTER — Ambulatory Visit: Payer: Medicare Other | Admitting: Family Medicine

## 2023-04-27 ENCOUNTER — Other Ambulatory Visit: Payer: Self-pay | Admitting: *Deleted

## 2023-04-27 DIAGNOSIS — F32A Depression, unspecified: Secondary | ICD-10-CM

## 2023-04-27 MED ORDER — BUPROPION HCL ER (SR) 150 MG PO TB12
150.0000 mg | ORAL_TABLET | Freq: Two times a day (BID) | ORAL | 11 refills | Status: DC
Start: 2023-04-27 — End: 2023-04-28

## 2023-04-27 NOTE — Progress Notes (Signed)
    SUBJECTIVE:   CHIEF COMPLAINT: HTN HPI:   Allison Steele is a 52 y.o.  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   She reports no concerns today.  She does report some increased urination from her medication.  She is able to afford her combination pill though it is $47 a month.  She denies headaches, chest pain, dyspnea or dizziness.  She is working slowly on her weight loss.  She is eating a relatively healthy diet. She brings today with her her home readings All home reading 110-120/70-80.   In terms of her sarcoidosis she has follow-up with her eye physician Dr. Emily Filbert and her pulmonologist.    PERTINENT  PMH / PSH/Family/Social History : HTN, Leber's hereditary optic neuropathy  OBJECTIVE:   BP 120/75   Pulse 79   Ht 5\' 4"  (1.626 m)   Wt 172 lb 9.6 oz (78.3 kg)   LMP 04/26/2017   SpO2 100%   BMI 29.63 kg/m   Today's weight:  Last Weight  Most recent update: 04/28/2023  9:03 AM    Weight  78.3 kg (172 lb 9.6 oz)            Review of prior weights: Filed Weights   04/28/23 0903  Weight: 172 lb 9.6 oz (78.3 kg)    Cardiac: Regular rate and rhythm. Normal S1/S2. No murmurs, rubs, or gallops appreciated. Lungs: Clear bilaterally to ascultation.  Abdomen: Normoactive bowel sounds. No tenderness to deep or light palpation. No rebound or guarding.   Psych: Pleasant and appropriate    ASSESSMENT/PLAN:   Assessment & Plan Essential hypertension At goal continue current medications.  Follow-up in March for blood work. Cutaneous sarcoidosis She follows with pulmonary and has follow-up scheduled Leber's hereditary optic neuropathy Dr. Emily Filbert is eye doctor DEPRESSION/ANXIETY Doing well I have refilled her bupropion she has no concerns at this time. Depression, unspecified depression type Duplicate problem above dyspepsia Refilled her omeprazole.  She denies dysphagia, hematochezia, melena or  change in GI symptoms at this time.   HCM UTD on flu and covid   She follows with gynecology for her women's care I discussed the time and day of the upcoming visit with her.  Also reviewed her recent mammogram with her.  Terisa Starr, MD  Family Medicine Teaching Service  [facility]

## 2023-04-28 ENCOUNTER — Ambulatory Visit (INDEPENDENT_AMBULATORY_CARE_PROVIDER_SITE_OTHER): Payer: Medicare Other | Admitting: Family Medicine

## 2023-04-28 ENCOUNTER — Encounter: Payer: Self-pay | Admitting: Family Medicine

## 2023-04-28 VITALS — BP 120/75 | HR 79 | Ht 64.0 in | Wt 172.6 lb

## 2023-04-28 DIAGNOSIS — D863 Sarcoidosis of skin: Secondary | ICD-10-CM | POA: Diagnosis not present

## 2023-04-28 DIAGNOSIS — I10 Essential (primary) hypertension: Secondary | ICD-10-CM | POA: Diagnosis not present

## 2023-04-28 DIAGNOSIS — H4722 Hereditary optic atrophy: Secondary | ICD-10-CM

## 2023-04-28 DIAGNOSIS — F32A Depression, unspecified: Secondary | ICD-10-CM

## 2023-04-28 DIAGNOSIS — F341 Dysthymic disorder: Secondary | ICD-10-CM | POA: Diagnosis not present

## 2023-04-28 DIAGNOSIS — K219 Gastro-esophageal reflux disease without esophagitis: Secondary | ICD-10-CM

## 2023-04-28 MED ORDER — SPIRONOLACTONE 50 MG PO TABS: 50.0000 mg | ORAL_TABLET | Freq: Every day | ORAL | 11 refills | Status: DC

## 2023-04-28 MED ORDER — BUPROPION HCL ER (SR) 150 MG PO TB12
150.0000 mg | ORAL_TABLET | Freq: Two times a day (BID) | ORAL | 11 refills | Status: DC
Start: 2023-04-28 — End: 2023-12-01

## 2023-04-28 MED ORDER — OLMESARTAN-AMLODIPINE-HCTZ 40-10-12.5 MG PO TABS
1.0000 | ORAL_TABLET | Freq: Every day | ORAL | 11 refills | Status: DC
Start: 2023-04-28 — End: 2023-12-01

## 2023-04-28 MED ORDER — OMEPRAZOLE 20 MG PO CPDR: DELAYED_RELEASE_CAPSULE | ORAL | 11 refills | Status: DC

## 2023-04-28 NOTE — Assessment & Plan Note (Signed)
Dr. Emily Filbert is eye doctor

## 2023-04-28 NOTE — Patient Instructions (Addendum)
It was wonderful to see you today.  Please bring ALL of your medications with you to every visit.   Today we talked about:   Follow up in [facility]!  Your blood pressure is great  No labs today  Call me if your medication is too expensive    Thank you for choosing [facility].   Please call [phone] with any questions about today's appointment.  Please be sure to schedule follow up at the front  desk before you leave today.   Terisa Starr, MD  Family Medicine

## 2023-04-28 NOTE — Assessment & Plan Note (Signed)
Doing well I have refilled her bupropion she has no concerns at this time.

## 2023-04-28 NOTE — Assessment & Plan Note (Signed)
She follows with pulmonary and has follow-up scheduled

## 2023-04-28 NOTE — Assessment & Plan Note (Signed)
At goal continue current medications.  Follow-up in March for blood work.

## 2023-06-05 ENCOUNTER — Ambulatory Visit: Payer: Medicare Other | Admitting: Pulmonary Disease

## 2023-06-13 ENCOUNTER — Ambulatory Visit (INDEPENDENT_AMBULATORY_CARE_PROVIDER_SITE_OTHER): Payer: Medicare Other | Admitting: Nurse Practitioner

## 2023-06-13 ENCOUNTER — Encounter: Payer: Self-pay | Admitting: Nurse Practitioner

## 2023-06-13 ENCOUNTER — Ambulatory Visit: Payer: Medicare Other | Admitting: Orthopedic Surgery

## 2023-06-13 ENCOUNTER — Ambulatory Visit (INDEPENDENT_AMBULATORY_CARE_PROVIDER_SITE_OTHER): Payer: Medicare Other

## 2023-06-13 VITALS — BP 124/72 | HR 87 | Ht 63.0 in | Wt 173.0 lb

## 2023-06-13 DIAGNOSIS — M25531 Pain in right wrist: Secondary | ICD-10-CM | POA: Diagnosis not present

## 2023-06-13 DIAGNOSIS — B9689 Other specified bacterial agents as the cause of diseases classified elsewhere: Secondary | ICD-10-CM

## 2023-06-13 DIAGNOSIS — N898 Other specified noninflammatory disorders of vagina: Secondary | ICD-10-CM

## 2023-06-13 DIAGNOSIS — Z9189 Other specified personal risk factors, not elsewhere classified: Secondary | ICD-10-CM | POA: Diagnosis not present

## 2023-06-13 DIAGNOSIS — N760 Acute vaginitis: Secondary | ICD-10-CM

## 2023-06-13 DIAGNOSIS — Z01419 Encounter for gynecological examination (general) (routine) without abnormal findings: Secondary | ICD-10-CM

## 2023-06-13 LAB — WET PREP FOR TRICH, YEAST, CLUE

## 2023-06-13 MED ORDER — TINIDAZOLE 500 MG PO TABS: 2.0000 g | ORAL_TABLET | Freq: Every day | ORAL | 0 refills | Status: AC

## 2023-06-13 NOTE — Progress Notes (Signed)
 Allison Steele - 54 y.o. female MRN [id number]  Date of birth: 02-Dec-1970  Office Visit Note: Visit Date: 06/13/2023 PCP: Delores Suzann HERO, MD Referred by: Delores Suzann HERO, MD  Subjective: No chief complaint on file.  HPI: Allison Steele is a pleasant 53 y.o. female who presents today for evaluation of right hand numbness and tingling of the radial sided digits primarily at night.  She is legally blind, however she is quite independent and functional at baseline.  She does work with her hands, and does sewing in her spare time.  She states that the symptoms have been present now for over 1 year, worsening in nature.  She has trialed a brace for her nocturnal symptoms without significant relief.  No prior surgeries, no other interventions tried, no prior electrodiagnostic studies.  Pertinent ROS were reviewed with the patient and found to be negative unless otherwise specified above in HPI.   Visit Reason: right hand numbness and tingling Duration of symptoms: 1+ year Hand dominance: right Occupation: Diabetic: No Smoking: No Heart/Lung History: none Blood Thinners: none  Prior Testing/EMG: none Injections (Date): none Treatments: OTC brace- no relief Prior Surgery: none  Assessment & Plan: Visit Diagnoses:  1. Pain in right wrist     Plan: Extensive discussion was had with the patient today regarding her right hand numbness and tingling of the radial sided digits.  Clinically, she is demonstrating signs symptoms consistent with carpal tunnel syndrome.  We discussed the etiology and pathophysiology of carpal tunnel syndrome as well as the treatment modalities ranging from conservative to surgical.  She has begun some conservative treatments with bracing, her symptoms are refractory to this care at this juncture.  At this point, would like her to obtain electrodiagnostic study of the right upper extremity in order to better delineate potential nerve compression and severity.   She will return to me after the EMG is complete to review the results and discuss further treatment options.  We did discuss the possibility for injections or potential surgical intervention in the future should the symptoms continue to remain refractory to conservative care.  She expressed full understanding, return to me after the electrodiagnostic study is complete.  Follow-up: No follow-ups on file.   Meds & Orders: No orders of the defined types were placed in this encounter.   Orders Placed This Encounter  Procedures   XR Wrist Complete Right   Ambulatory referral to Physical Medicine Rehab     Procedures: No procedures performed      Clinical History: No specialty comments available.  She reports that she has never smoked. She has never used smokeless tobacco.  Recent Labs    09/16/22 1036  HGBA1C 5.3    Objective:   Vital Signs: LMP 04/26/2017   Physical Exam  Gen: Well-appearing, in no acute distress; non-toxic CV: Regular Rate. Well-perfused. Warm.  Resp: Breathing unlabored on room air; no wheezing. Psych: Fluid speech in conversation; appropriate affect; normal thought process  Ortho Exam PHYSICAL EXAM:  General: Patient is well appearing and in no distress. Cervical spine mobility is full in all directions:  Skin and Muscle: No significant skin changes are apparent to right upper extremity.  Range of Motion and Palpation Tests: Mobility is full about the elbows with flexion and extension. Forearm supination and pronation are 85/85 bilaterally.  Wrist flexion/extension is 75/65 bilaterally.  Digital flexion and extension are full.  Thumb opposition is full to the base of the small fingers bilaterally.  No cords or nodules are palpated.  No triggering is observed.     Neurologic, Vascular, Motor: Sensation is diminished to light touch in the right median nerve distribution.    Thenar atrophy: Mild right side Tinel sign: Positive right carpal  tunnel Carpal tunnel compression: Mildly positive right carpal tunnel Phalen test: Mildly positive right carpal tunnel  Sensory right hand 2-point discrimination (thumb, index, middle): 6 mm  Motor right hand FPL: 5/5 Index FDP: 5/5 APB: 4+/5  Fingers pink and well perfused.  Capillary refill is brisk.     Lab Results  Component Value Date   HGBA1C 5.3 09/16/2022     Imaging: XR Wrist Complete Right Result Date: 06/13/2023 X-rays of the right wrist, multiple views were completed today X-rays demonstrate stable appearance of the radiocarpal and midcarpal articulations without significant bony abnormalities.   Past Medical/Family/Surgical/Social History: Medications & Allergies reviewed per EMR, new medications updated. 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 Loss of central vision bilaterally.      Legally blind    Leber`s Optic Neuropathy - Dx at age 55, resulting in blindness, followed at [facility]   Nephrolithiasis    Sarcoidosis     - followed by Dr. Robinson, [facility], [phone]   Family History  Problem Relation Age of Onset   Hypertension Mother    Lung cancer Mother    Pneumonia Mother    Alcohol abuse Father    Lung cancer Father    Hypertension Father    Cystic fibrosis Sister    Diabetes Sister    Hypertension Sister    Diabetes Maternal  Aunt    Diabetes Paternal Aunt    Diabetes Maternal Grandmother    Hypertension Son    Colon polyps Neg Hx    Esophageal cancer Neg Hx    Rectal cancer Neg Hx    Stomach cancer Neg Hx    Past Surgical History:  Procedure Laterality Date   ANKLE FRACTURE SURGERY Left    CESAREAN SECTION     GANGLION CYST EXCISION Left 12/15/2021   Procedure: LEFT DORSAL GANGLION CYST EXCISION;  Surgeon: Romona Harari, MD;  Location: [facility];  Service: Orthopedics;  Laterality: Left;   ROBOTIC ASSISTED TOTAL HYSTERECTOMY WITH SALPINGECTOMY Bilateral 05/10/2017   Procedure: ROBOTIC ASSISTED TOTAL HYSTERECTOMY WITH SALPINGECTOMY;  Surgeon: Lavoie, Marie-Lyne, MD;  Location: [facility];  Service: Gynecology;  Laterality: Bilateral;  laparoscopic  Request 7:30am OR start time in [place] [facility] block time  Requests 2 1/2 hours Dr. would like to use Fenestrated Bipolar (in cupboard Rm 7. Blue cord there also)   Social History   Occupational History   Occupation: [employer] of [employer]  Tobacco Use   Smoking status: Never   Smokeless tobacco: Never  Vaping Use   Vaping status: Never Used  Substance and Sexual Activity   Alcohol use: Yes  Comment: social   Drug use: No   Sexual activity: Not Currently    Partners: Male    Birth control/protection: Surgical, Abstinence    Comment: hysterectomy, younger than 5, partners 5    Jannet Calip Estela) Arlinda, M.D. [facility] 9:25 AM

## 2023-06-13 NOTE — Progress Notes (Signed)
 Allison Steele Sep 14, 1970 [id number]   History:  53 y.o. G1P1001 presents for annual exam. S/P 2018 Robotic TLH/Bilateral Salpingectomy for fibroids. Normal pap history. HTN, anxiety and depression managed by PCP. Feels like she gets BV often. Having mild itching without odor or discharge.   Gynecologic History Patient's last menstrual period was 04/26/2017.   Contraception/Family planning: status post hysterectomy Sexually active: No, due to partner's health  Health Maintenance Last Pap: 12/30/2021. Results were: Normal Last mammogram: 03/13/2023. Results were: Normal Last colonoscopy: 11/30/2020, 10 year recall Last Dexa: Not indicated  Past medical history, past surgical history, family history and social history were all reviewed and documented in the EPIC chart. Boyfriend. 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 52 yo son.   ROS:  A ROS was performed and pertinent positives and negatives are included.  Exam:  Vitals:   06/13/23 1456  BP: 124/72  Pulse: 87  SpO2: 99%  Weight: 173 lb (78.5 kg)  Height: 5' 3 (1.6 m)   Body mass index is 30.65 kg/m.  General appearance:  Normal Thyroid:  Symmetrical, normal in size, without palpable masses or nodularity. Respiratory  Auscultation:  Clear without wheezing or rhonchi Cardiovascular  Auscultation:  Regular rate, without rubs, murmurs or gallops  Edema/varicosities:  Not grossly evident Abdominal  Soft,nontender, without masses, guarding or rebound.  Liver/spleen:  No organomegaly noted  Hernia:  None appreciated  Skin  Inspection:  Grossly normal Breasts: Examined lying and sitting.   Right: Without masses, retractions, nipple discharge or axillary adenopathy.   Left: Without masses, retractions, nipple discharge or axillary adenopathy. Pelvic: External genitalia:  no lesions              Urethra:  normal appearing urethra with no masses, tenderness or lesions              Bartholins and  Skenes: normal                 Vagina: normal appearing vagina with normal color and discharge, no lesions              Cervix: absent Bimanual Exam:  Uterus:  absent              Adnexa: no mass, fullness, tenderness              Rectovaginal: Deferred              Anus:  normal, no lesions   Patient informed chaperone available to be present for breast and pelvic exam. Patient has requested no chaperone to be present. Patient has been advised what will be completed during breast and pelvic exam.   Wet prep + clue cells (+ odor)  Assessment/Plan:  53 y.o. G1P1001 for annual exam.   Well female exam with routine gynecological exam - Education provided on SBEs, importance of preventative screenings, current guidelines, high calcium diet, regular exercise, and multivitamin daily. Labs with PCP.   Vaginal itching - Plan: WET PREP FOR TRICH, YEAST, CLUE. + clue cells.   Bacterial vaginosis - Plan: tinidazole  (TINDAMAX ) 500 MG tablet (2 grams) daily x 2 days. She prefers over Metronidazole . Feels she gets BV often. Avoid harsh soaps/body washes, start women's probiotic.   Screening for cervical cancer - Normal Pap history.  S/P 2018 Robotic TLH/Bilateral Salpingectomy for fibroids. No longer screening per guidelines.   Screening for breast cancer - Normal mammogram history.  Continue annual screenings.  Normal breast exam today.  Screening for colon cancer - 2022 colonoscopy. Will repeat at 10-year interval per GI's recommendation.   Screening for osteoporosis - Average risk. Will plan DXA at age 53.  Return in about 1 year (around 06/12/2024) for Annual.   Allison DELENA Shutter DNP, 3:24 PM 06/13/2023

## 2023-06-20 ENCOUNTER — Telehealth: Payer: Self-pay | Admitting: *Deleted

## 2023-06-20 ENCOUNTER — Other Ambulatory Visit: Payer: Self-pay | Admitting: Nurse Practitioner

## 2023-06-20 DIAGNOSIS — N760 Acute vaginitis: Secondary | ICD-10-CM

## 2023-06-20 MED ORDER — FLUCONAZOLE 150 MG PO TABS: 150.0000 mg | ORAL_TABLET | ORAL | 0 refills | Status: DC

## 2023-06-20 NOTE — Telephone Encounter (Signed)
 Diflucan sent to pharmacy. If symptoms persist or return will need OV.

## 2023-06-20 NOTE — Telephone Encounter (Signed)
 Patient notified. Encounter closed

## 2023-06-20 NOTE — Telephone Encounter (Signed)
 Call returned to patient. Patient completed tindamax  on Friday or Saturday. Patient reports clear vaginal discharge still present, asking for yeast treatment. Denies vaginal itching, burning, odor or bleeding.   AEX 06/13/23 -Tx for BV  Routing to Tiffany to review and advise.

## 2023-06-23 ENCOUNTER — Ambulatory Visit (INDEPENDENT_AMBULATORY_CARE_PROVIDER_SITE_OTHER): Payer: Medicare Other | Admitting: Physical Medicine and Rehabilitation

## 2023-06-23 DIAGNOSIS — G8929 Other chronic pain: Secondary | ICD-10-CM | POA: Diagnosis not present

## 2023-06-23 DIAGNOSIS — M67432 Ganglion, left wrist: Secondary | ICD-10-CM | POA: Diagnosis not present

## 2023-06-23 DIAGNOSIS — R202 Paresthesia of skin: Secondary | ICD-10-CM

## 2023-06-23 DIAGNOSIS — M25511 Pain in right shoulder: Secondary | ICD-10-CM

## 2023-06-23 DIAGNOSIS — M25531 Pain in right wrist: Secondary | ICD-10-CM

## 2023-06-23 DIAGNOSIS — M542 Cervicalgia: Secondary | ICD-10-CM

## 2023-06-23 NOTE — Progress Notes (Unsigned)
Functional Pain Scale - descriptive words and definitions   Severe (9)  Cannot do any ADL's even with assistance can barely talk/unable to sleep and unable to use distraction. Severe range order  Average Pain 9  RUE NCS- pain and tingling in hand and shoulder. Its worse at night. No difficultly grasping.

## 2023-06-26 ENCOUNTER — Encounter: Payer: Self-pay | Admitting: Physical Medicine and Rehabilitation

## 2023-06-26 NOTE — Progress Notes (Signed)
Allison Steele - 53 y.o. female MRN [id number]  Date of birth: [birth year]  Office Visit Note: Visit Date: 06/23/2023 PCP: Westley Chandler, MD Referred by: Samuella Cota, MD  Subjective: Chief Complaint  Patient presents with   Right Hand - Pain, Tingling   HPI: Allison Steele is a 53 y.o. female who comes in today at the request of Dr. Bonner Puna for evaluation and management of chronic, worsening and severe pain, numbness and tingling in the Right upper extremities.  Patient is Right hand dominant.  She reports over a year of pain numbness and tingling particular in the radial digits on the right.  The symptoms can radiate up into the shoulder at times and she does endorse some neck pain.  She denies any symptoms really shooting down the arm.  She denies any left-sided complaints.  No specific injury.  She does use her hands a lot with hobbies and work etc.  She has tried wrist splints at night.  She does get nocturnal complaints.  The splints have not really helped that much.  She has had some medication management but no other management.  No history of cervical spine problems or diabetes or thyroid disease.   I spent more than 30 minutes speaking face-to-face with the patient with 50% of the time in counseling and discussing coordination of care.      Review of Systems  Musculoskeletal:  Positive for joint pain.  Neurological:  Positive for tingling and focal weakness.  All other systems reviewed and are negative.  Otherwise per HPI.  Assessment & Plan: Visit Diagnoses:    ICD-10-CM   1. Paresthesia of skin  R20.2 NCV with EMG (electromyography)    2. Pain in right wrist  M25.531     3. Ganglion cyst of dorsum of left wrist  M67.432     4. Chronic right shoulder pain  M25.511    G89.29     5. Cervicalgia  M54.2        Plan: Impression: Differential diagnosis is median nerve neuropathy versus radiculopathy and/or musculotendinous disorder.  Electrodiagnostic  study performed today.  The above electrodiagnostic study is ABNORMAL and reveals evidence of a moderate to severe right median nerve entrapment at the wrist (carpal tunnel syndrome) affecting sensory and motor components. There is no significant electrodiagnostic evidence of any other focal nerve entrapment, brachial plexopathy or cervical radiculopathy.   Recommendations: 1.  Follow-up with referring physician. 2.  Continue current management of symptoms. 3.  Continue use of resting splint at night-time and as needed during the day. 4.  Suggest surgical evaluation.  Meds & Orders: No orders of the defined types were placed in this encounter.   Orders Placed This Encounter  Procedures   NCV with EMG (electromyography)    Follow-up: Return for Anshul Argarwala, MD.   Procedures: No procedures performed  EMG & NCV Findings: Evaluation of the right median motor nerve showed prolonged distal onset latency (5.5 ms) and decreased conduction velocity (Elbow-Wrist, 48 m/s).  The right median (across palm) sensory nerve showed prolonged distal peak latency (Wrist, 6.6 ms), reduced amplitude (6.0 V), and prolonged distal peak latency (Palm, 3.1 ms).  All remaining nerves (as indicated in the following tables) were within normal limits.    All examined muscles (as indicated in the following table) showed no evidence of electrical instability.    Impression: The above electrodiagnostic study is ABNORMAL and reveals evidence of a moderate to severe right median nerve entrapment  at the wrist (carpal tunnel syndrome) affecting sensory and motor components. There is no significant electrodiagnostic evidence of any other focal nerve entrapment, brachial plexopathy or cervical radiculopathy.   Recommendations: 1.  Follow-up with referring physician. 2.  Continue current management of symptoms. 3.  Continue use of resting splint at night-time and as needed during the day. 4.  Suggest surgical  evaluation.  ___________________________ Naaman Plummer FAAPMR Board Certified, American Board of Physical Medicine and Rehabilitation    Nerve Conduction Studies Anti Sensory Summary Table   Stim Site NR Peak (ms) Norm Peak (ms) P-T Amp (V) Norm P-T Amp Site1 Site2 Delta-P (ms) Dist (cm) Vel (m/s) Norm Vel (m/s)  Right Median Acr Palm Anti Sensory (2nd Digit)  31.6C  Wrist    *6.6 <3.6 *6.0 >10 Wrist Palm 3.5 0.0    Palm    *3.1 <2.0 4.3         Right Radial Anti Sensory (Base 1st Digit)  31.5C  Wrist    2.2 <3.1 30.0  Wrist Base 1st Digit 2.2 0.0    Right Ulnar Anti Sensory (5th Digit)  32.2C  Wrist    3.4 <3.7 32.0 >15.0 Wrist 5th Digit 3.4 14.0 41 >38   Motor Summary Table   Stim Site NR Onset (ms) Norm Onset (ms) O-P Amp (mV) Norm O-P Amp Site1 Site2 Delta-0 (ms) Dist (cm) Vel (m/s) Norm Vel (m/s)  Right Median Motor (Abd Poll Brev)  31.6C  Wrist    *5.5 <4.2 5.2 >5 Elbow Wrist 4.6 22.0 *48 >50  Elbow    10.1  5.0         Right Ulnar Motor (Abd Dig Min)  31.9C  Wrist    2.8 <4.2 6.1 >3 B Elbow Wrist 3.7 21.0 57 >53  B Elbow    6.5  6.8  A Elbow B Elbow 1.3 10.0 77 >53  A Elbow    7.8  6.8          EMG   Side Muscle Nerve Root Ins Act Fibs Psw Amp Dur Poly Recrt Int Dennie Bible Comment  Right Abd Poll Brev Median C8-T1 Nml Nml Nml Nml Nml 0 Nml Nml   Right 1stDorInt Ulnar C8-T1 Nml Nml Nml Nml Nml 0 Nml Nml   Right PronatorTeres Median C6-7 Nml Nml Nml Nml Nml 0 Nml Nml   Right Biceps Musculocut C5-6 Nml Nml Nml Nml Nml 0 Nml Nml   Right Deltoid Axillary C5-6 Nml Nml Nml Nml Nml 0 Nml Nml     Nerve Conduction Studies Anti Sensory Left/Right Comparison   Stim Site L Lat (ms) R Lat (ms) L-R Lat (ms) L Amp (V) R Amp (V) L-R Amp (%) Site1 Site2 L Vel (m/s) R Vel (m/s) L-R Vel (m/s)  Median Acr Palm Anti Sensory (2nd Digit)  31.6C  Wrist  *6.6   *6.0  Wrist Palm     Palm  *3.1   4.3        Radial Anti Sensory (Base 1st Digit)  31.5C  Wrist  2.2   30.0  Wrist Base 1st  Digit     Ulnar Anti Sensory (5th Digit)  32.2C  Wrist  3.4   32.0  Wrist 5th Digit  41    Motor Left/Right Comparison   Stim Site L Lat (ms) R Lat (ms) L-R Lat (ms) L Amp (mV) R Amp (mV) L-R Amp (%) Site1 Site2 L Vel (m/s) R Vel (m/s) L-R Vel (m/s)  Median Motor (Abd [employer]  Brev)  31.6C  Wrist  *5.5   5.2  Elbow Wrist  *48   Elbow  10.1   5.0        Ulnar Motor (Abd Dig Min)  31.9C  Wrist  2.8   6.1  B Elbow Wrist  57   B Elbow  6.5   6.8  A Elbow B Elbow  77   A Elbow  7.8   6.8           Waveforms:            Clinical History: No specialty comments available.   She reports that she has never smoked. She has never used smokeless tobacco.  Recent Labs    09/16/22 1036  HGBA1C 5.3    Objective:  VS:  HT:    WT:   BMI:     BP:   HR: bpm  TEMP: ( )  RESP:  Physical Exam Vitals and nursing note reviewed.  Constitutional:      General: She is not in acute distress.    Appearance: Normal appearance. She is well-developed. She is not ill-appearing.  HENT:     Head: Normocephalic and atraumatic.  Eyes:     Conjunctiva/sclera: Conjunctivae normal.     Pupils: Pupils are equal, round, and reactive to light.  Cardiovascular:     Rate and Rhythm: Normal rate.     Pulses: Normal pulses.  Pulmonary:     Effort: Pulmonary effort is normal.  Musculoskeletal:        General: No swelling, tenderness or deformity.     Right lower leg: No edema.     Left lower leg: No edema.     Comments: Inspection reveals no atrophy of the bilateral APB or FDI or hand intrinsics. There is no swelling, color changes, allodynia or dystrophic changes. There is 5 out of 5 strength in the bilateral wrist extension, finger abduction and long finger flexion. There is intact sensation to light touch in all dermatomal and peripheral nerve distributions.  There is a negative Tinel's test at the bilateral wrist and elbow. There is a positive Phalen's test on the right. There is a negative Hoffmann's  test bilaterally.  Skin:    General: Skin is warm and dry.     Findings: No erythema or rash.  Neurological:     General: No focal deficit present.     Mental Status: She is alert and oriented to person, place, and time.     Cranial Nerves: No cranial nerve deficit.     Sensory: No sensory deficit.     Motor: No weakness or abnormal muscle tone.     Coordination: Coordination normal.     Gait: Gait normal.  Psychiatric:        Mood and Affect: Mood normal.        Behavior: Behavior normal.     Ortho Exam  Imaging: No results found.  Past Medical/Family/Surgical/Social History: Medications & Allergies reviewed per EMR, new medications updated. 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 Loss of central vision bilaterally.      Legally blind    Leber`s Optic Neuropathy - Dx at age 60, resulting in blindness, followed at [facility]   Nephrolithiasis    Sarcoidosis     - followed by Dr. Emily Filbert, [facility], [phone]   Family History  Problem Relation Age of Onset   Hypertension Mother    Lung cancer Mother    Pneumonia Mother    Alcohol abuse Father    Lung cancer Father    Hypertension Father    Cystic fibrosis Sister    Diabetes Sister    Hypertension Sister    Diabetes Maternal Aunt    Diabetes Paternal Aunt    Diabetes Maternal Grandmother    Hypertension  Son    Colon polyps Neg Hx    Esophageal cancer Neg Hx    Rectal cancer Neg Hx    Stomach cancer Neg Hx    Past Surgical History:  Procedure Laterality Date   ANKLE FRACTURE SURGERY Left    CESAREAN SECTION     GANGLION CYST EXCISION Left 12/15/2021   Procedure: LEFT DORSAL GANGLION CYST EXCISION;  Surgeon: Marlyne Beards, MD;  Location: [facility];  Service: Orthopedics;  Laterality: Left;   ROBOTIC ASSISTED TOTAL HYSTERECTOMY WITH SALPINGECTOMY Bilateral 05/10/2017   Procedure: ROBOTIC ASSISTED TOTAL HYSTERECTOMY WITH SALPINGECTOMY;  Surgeon: Genia Del, MD;  Location: [facility];  Service: Gynecology;  Laterality: Bilateral;  laparoscopic  Request 7:30am OR start time in [place] [facility] block time  Requests 2 1/2 hours Dr. would like to use Fenestrated Bipolar (in cupboard Rm 7. Blue cord there also)   Social History   Occupational History   Occupation: [employer]  Tobacco Use   Smoking status: Never   Smokeless tobacco: Never  Vaping Use   Vaping status: Never Used  Substance and Sexual Activity   Alcohol use: Yes    Comment: social   Drug use: No   Sexual activity: Not Currently    Partners: Male    Birth control/protection: Surgical, Abstinence    Comment: hysterectomy, younger than 80, partners 5

## 2023-06-26 NOTE — Procedures (Signed)
EMG & NCV Findings: Evaluation of the right median motor nerve showed prolonged distal onset latency (5.5 ms) and decreased conduction velocity (Elbow-Wrist, 48 m/s).  The right median (across palm) sensory nerve showed prolonged distal peak latency (Wrist, 6.6 ms), reduced amplitude (6.0 V), and prolonged distal peak latency (Palm, 3.1 ms).  All remaining nerves (as indicated in the following tables) were within normal limits.    All examined muscles (as indicated in the following table) showed no evidence of electrical instability.    Impression: The above electrodiagnostic study is ABNORMAL and reveals evidence of a moderate to severe right median nerve entrapment at the wrist (carpal tunnel syndrome) affecting sensory and motor components. There is no significant electrodiagnostic evidence of any other focal nerve entrapment, brachial plexopathy or cervical radiculopathy.   Recommendations: 1.  Follow-up with referring physician. 2.  Continue current management of symptoms. 3.  Continue use of resting splint at night-time and as needed during the day. 4.  Suggest surgical evaluation.  ___________________________ Allison Steele FAAPMR Board Certified, American Board of Physical Medicine and Rehabilitation    Nerve Conduction Studies Anti Sensory Summary Table   Stim Site NR Peak (ms) Norm Peak (ms) P-T Amp (V) Norm P-T Amp Site1 Site2 Delta-P (ms) Dist (cm) Vel (m/s) Norm Vel (m/s)  Right Median Acr Palm Anti Sensory (2nd Digit)  31.6C  Wrist    *6.6 <3.6 *6.0 >10 Wrist Palm 3.5 0.0    Palm    *3.1 <2.0 4.3         Right Radial Anti Sensory (Base 1st Digit)  31.5C  Wrist    2.2 <3.1 30.0  Wrist Base 1st Digit 2.2 0.0    Right Ulnar Anti Sensory (5th Digit)  32.2C  Wrist    3.4 <3.7 32.0 >15.0 Wrist 5th Digit 3.4 14.0 41 >38   Motor Summary Table   Stim Site NR Onset (ms) Norm Onset (ms) O-P Amp (mV) Norm O-P Amp Site1 Site2 Delta-0 (ms) Dist (cm) Vel (m/s) Norm Vel (m/s)   Right Median Motor (Abd Poll Brev)  31.6C  Wrist    *5.5 <4.2 5.2 >5 Elbow Wrist 4.6 22.0 *48 >50  Elbow    10.1  5.0         Right Ulnar Motor (Abd Dig Min)  31.9C  Wrist    2.8 <4.2 6.1 >3 B Elbow Wrist 3.7 21.0 57 >53  B Elbow    6.5  6.8  A Elbow B Elbow 1.3 10.0 77 >53  A Elbow    7.8  6.8          EMG   Side Muscle Nerve Root Ins Act Fibs Psw Amp Dur Poly Recrt Int Dennie Bible Comment  Right Abd Poll Brev Median C8-T1 Nml Nml Nml Nml Nml 0 Nml Nml   Right 1stDorInt Ulnar C8-T1 Nml Nml Nml Nml Nml 0 Nml Nml   Right PronatorTeres Median C6-7 Nml Nml Nml Nml Nml 0 Nml Nml   Right Biceps Musculocut C5-6 Nml Nml Nml Nml Nml 0 Nml Nml   Right Deltoid Axillary C5-6 Nml Nml Nml Nml Nml 0 Nml Nml     Nerve Conduction Studies Anti Sensory Left/Right Comparison   Stim Site L Lat (ms) R Lat (ms) L-R Lat (ms) L Amp (V) R Amp (V) L-R Amp (%) Site1 Site2 L Vel (m/s) R Vel (m/s) L-R Vel (m/s)  Median Acr Palm Anti Sensory (2nd Digit)  31.6C  Wrist  *6.6   *6.0  Wrist  Palm     Palm  *3.1   4.3        Radial Anti Sensory (Base 1st Digit)  31.5C  Wrist  2.2   30.0  Wrist Base 1st Digit     Ulnar Anti Sensory (5th Digit)  32.2C  Wrist  3.4   32.0  Wrist 5th Digit  41    Motor Left/Right Comparison   Stim Site L Lat (ms) R Lat (ms) L-R Lat (ms) L Amp (mV) R Amp (mV) L-R Amp (%) Site1 Site2 L Vel (m/s) R Vel (m/s) L-R Vel (m/s)  Median Motor (Abd Poll Brev)  31.6C  Wrist  *5.5   5.2  Elbow Wrist  *48   Elbow  10.1   5.0        Ulnar Motor (Abd Dig Min)  31.9C  Wrist  2.8   6.1  B Elbow Wrist  57   B Elbow  6.5   6.8  A Elbow B Elbow  77   A Elbow  7.8   6.8           Waveforms:

## 2023-07-31 ENCOUNTER — Ambulatory Visit (INDEPENDENT_AMBULATORY_CARE_PROVIDER_SITE_OTHER): Payer: Medicare Other | Admitting: Pulmonary Disease

## 2023-07-31 ENCOUNTER — Encounter: Payer: Self-pay | Admitting: Pulmonary Disease

## 2023-07-31 VITALS — BP 124/78 | HR 71 | Ht 63.0 in | Wt 177.2 lb

## 2023-07-31 DIAGNOSIS — D869 Sarcoidosis, unspecified: Secondary | ICD-10-CM | POA: Diagnosis not present

## 2023-07-31 NOTE — Progress Notes (Signed)
 Allison Steele    [id number]    Sep 10, 1970  Primary Care Physician:Brown, Bonner Puna, MD  Referring Physician: Westley Chandler, MD [address] [place],  [place] [address]  Chief complaint:   Patient with a history of sarcoidosis In for yearly follow-up  HPI: Breathing remains relatively stable  Denies any significant ongoing respiratory symptoms  She does feel some lumpiness around her face, she will make an appointment to see dermatology  Has been off Plaquenil for about a year  Sarcoidosis was diagnosed following a skin biopsy over 7 years ago  Denies any shortness of breath, no chest pain or chest discomfort, no ongoing cough  Never smoker No occupational predisposition to lung disease No family history of asthma  Sarcoidosis was diagnosed following biopsy of a skin lesion about 7 years ago Has been on Plaquenil with control of the skin lesions-this was stopped about a year ago Denies any history of respiratory complaints No history of heart disease No history of kidney disease Skin lesions are well controlled at present  She has a history of lebers optic neuropathy, 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   buPROPion (WELLBUTRIN SR) 150 MG 12 hr tablet Take 1 tablet (150 mg total) by mouth 2 (two) times daily.   fluconazole (DIFLUCAN) 150 MG tablet Take 1 tablet (150 mg total) by mouth every 3 (three) days.   fluticasone (FLONASE) 50 MCG/ACT nasal spray Place 2 sprays into both nostrils daily.   Olmesartan-amLODIPine-HCTZ 40-10-12.5 MG TABS Take 1 tablet by mouth daily.   omeprazole (PRILOSEC) 20 MG capsule TAKE 1 CAPSULE BY MOUTH EVERY DAY   polyethylene glycol powder (GLYCOLAX/MIRALAX) 17 GM/SCOOP powder Take 17 g by mouth 2 (two) times daily as needed.   spironolactone (ALDACTONE) 50 MG tablet Take 1 tablet (50 mg  total) by mouth daily.   No facility-administered encounter medications on file as of 07/31/2023.    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      Legally blind    Leber`s Optic Neuropathy - Dx at age 44, resulting in blindness, followed at [facility]   Nephrolithiasis    Sarcoidosis     - followed by Dr. Emily Filbert, [facility], [phone]    Past Surgical History:  Procedure Laterality Date   ANKLE FRACTURE SURGERY Left    CESAREAN SECTION     GANGLION CYST EXCISION Left 12/15/2021   Procedure: LEFT DORSAL GANGLION CYST EXCISION;  Surgeon: Marlyne Beards, MD;  Location: [facility];  Service: Orthopedics;  Laterality: Left;   ROBOTIC ASSISTED TOTAL HYSTERECTOMY WITH SALPINGECTOMY Bilateral 05/10/2017   Procedure: ROBOTIC ASSISTED TOTAL HYSTERECTOMY WITH SALPINGECTOMY;  Surgeon: Genia Del, MD;  Location: [facility];  Service: Gynecology;  Laterality: Bilateral;  laparoscopic  Request 7:30am OR start time in [place] [facility] block time  Requests 2 1/2 hours Dr. would like to use  Fenestrated Bipolar (in cupboard Rm 7. Blue cord there also)    Family History  Problem Relation Age of Onset   Hypertension Mother    Lung cancer Mother    Pneumonia Mother    Alcohol abuse Father    Lung cancer Father    Hypertension Father    Cystic fibrosis Sister    Diabetes Sister    Hypertension Sister    Diabetes Maternal Aunt    Diabetes Paternal Aunt    Diabetes Maternal Grandmother    Hypertension Son    Colon polyps Neg Hx    Esophageal cancer  Neg Hx    Rectal cancer Neg Hx    Stomach cancer Neg Hx     Social History   Socioeconomic History   Marital status: Divorced    Spouse name: Not on file   Number of children: 1   Years of education: 12   Highest education level: High school graduate  Occupational History   Occupation: [employer]  Tobacco Use   Smoking status: Never   Smokeless tobacco: Never  Vaping Use   Vaping status: Never Used  Substance and Sexual Activity   Alcohol use: Yes    Comment: social   Drug use: No   Sexual activity: Not Currently    Partners: Male    Birth control/protection: Surgical, Abstinence    Comment: hysterectomy, younger than 17, partners 5  Other Topics Concern   Not on file  Social History Narrative   Patient lives alone in [place].   Patient has one son in the area.    Patient is legally blind and does not drive. Patient uses [employer] for transportation.    Patient exercises 3x per week for 1 hour.    Enjoys watching movies and reading.    Works at [employer] for [employer].   Social Drivers of Corporate investment banker Strain: Low Risk  (10/03/2022)   Overall Financial Resource Strain (CARDIA)    Difficulty of Paying Living Expenses: Not hard at all  Food Insecurity: No Food Insecurity (10/03/2022)   Hunger Vital Sign    Worried About Running Out of Food in the Last Year: Never true    Ran Out of Food in the Last Year: Never true  Transportation Needs: No Transportation Needs (10/03/2022)   PRAPARE - Administrator, Civil Service (Medical): No    Lack of Transportation (Non-Medical): No  Physical Activity: Insufficiently Active (10/03/2022)   Exercise Vital Sign    Days of Exercise per Week: 2 days    Minutes of Exercise per Session: 40 min  Stress: No Stress Concern Present (10/03/2022)   [employer] of Occupational Health - Occupational Stress Questionnaire    Feeling of Stress : Not at all  Social Connections: Moderately Isolated  (10/03/2022)   Social Connection and Isolation Panel [NHANES]    Frequency of Communication with Friends and Family: Three times a week    Frequency of Social Gatherings with Friends and Family: Three times a week    Attends Religious Services: More than 4 times per year    Active Member of Clubs or Organizations: No    Attends Banker Meetings: Never    Marital Status: Divorced  Catering manager Violence: Not At Risk (10/03/2022)   Humiliation, Afraid, Rape, and Kick questionnaire    Fear of Current or Ex-Partner: No    Emotionally Abused: No    Physically Abused: No    Sexually  Abused: No    Review of Systems  Constitutional:  Negative for activity change.  HENT: Negative.    Eyes:  Positive for visual disturbance.  Respiratory: Negative.  Negative for apnea, cough, choking, chest tightness, shortness of breath, wheezing and stridor.   Cardiovascular:  Negative for chest pain and palpitations.  Gastrointestinal: Negative.   All other systems reviewed and are negative.   Vitals:   07/31/23 1555  BP: 124/78  Pulse: 71  SpO2: 100%     Physical Exam Constitutional:      Appearance: Normal appearance. She is well-developed.  HENT:     Head: Normocephalic.     Nose: Nose normal.  Eyes:     General: No scleral icterus. Neck:     Thyroid: No thyromegaly.     Trachea: No tracheal deviation.  Cardiovascular:     Rate and Rhythm: Normal rate and regular rhythm.     Heart sounds: Normal heart sounds.  Pulmonary:     Effort: Pulmonary effort is normal. No respiratory distress.     Breath sounds: No stridor. No wheezing or rhonchi.  Musculoskeletal:     Cervical back: No rigidity or tenderness.  Skin:    General: Skin is warm and dry.  Neurological:     Mental Status: She is alert.  Psychiatric:        Mood and Affect: Mood normal.        Behavior: Behavior normal.    Chest x-ray 01/18/2018-reviewed by myself showing no acute infiltrate CT scan of the  chest reviewed showing calcified adenopathy, small lung nodules-no evidence of scarring  PFT on 07/04/2018 reveals no obstruction, no significant bronchodilator response, normal lung volumes  10/02/2020 BUN/creatinine of 14/0.8  PFT 02/28/2022 within normal limits  Most recent CT scan of the chest is 01/05/2022-stable pulmonary nodules and calcified adenopathy  Assessment:  History of sarcoidosis of the skin -Has remained stable -Has been off Plaquenil for about a year  Breathing has been stable -PFT is normal -CT scan is stable compared to 3 years ago -No ongoing respiratory complaints    Plan/Recommendations:  No changes  Encouraged to call with significant concerns  Follow-up with dermatology regarding discomfort around the face  Follow-up a year from now   Virl Diamond MD [facility] 07/31/2023, 4:10 PM  CC: Westley Chandler, MD

## 2023-07-31 NOTE — Patient Instructions (Signed)
 Follow-up in about a year from now  If you have any shortness of breath or cough that is unusual, make sure you reach out to [place]  Your breathing study a year ago was normal CT scan unchanged from previous  Call us with significant concerns  Follow-up with dermatology as we discussed

## 2023-09-04 ENCOUNTER — Ambulatory Visit: Payer: Medicare Other | Admitting: Orthopedic Surgery

## 2023-09-04 DIAGNOSIS — M25531 Pain in right wrist: Secondary | ICD-10-CM | POA: Diagnosis not present

## 2023-09-04 DIAGNOSIS — G5601 Carpal tunnel syndrome, right upper limb: Secondary | ICD-10-CM | POA: Diagnosis not present

## 2023-09-04 DIAGNOSIS — R202 Paresthesia of skin: Secondary | ICD-10-CM | POA: Diagnosis not present

## 2023-09-04 NOTE — Progress Notes (Signed)
 Allison Steele - 53 y.o. female MRN [id number]  Date of birth: 1971-01-17  Office Visit Note: Visit Date: 09/04/2023 PCP: Westley Chandler, MD Referred by: Westley Chandler, MD  Subjective: No chief complaint on file.  HPI: Allison Steele is a pleasant 53 y.o. female who returns today for follow-up of right hand numbness and tingling of the radial sided digits primarily at night.  She is legally blind, however she is quite independent and functional at baseline.  She does work with her hands, and does sewing in her spare time.  She states that the symptoms have been present now for over 1 year, worsening in nature.  She has trialed a brace for her nocturnal symptoms without significant relief.    She underwent recently for diagnostic study of the right upper cavity which did show moderate to severe carpal tunnel syndrome of the right upper extremity.  Her symptoms are relatively unchanged from her last visit in January of this year.  Pertinent ROS were reviewed with the patient and found to be negative unless otherwise specified above in HPI.    Assessment & Plan: Visit Diagnoses:  1. Carpal tunnel syndrome, right upper limb   2. Paresthesia of skin   3. Pain in right wrist     Plan: Extensive discussion was had with the patient today regarding her right hand numbness and tingling of the radial sided digits.  She has both clinical and electrodiagnostic evidence of right-sided carpal tunnel syndrome.  We once again discussed the etiology and pathophysiology of carpal tunnel syndrome as well as the treatment modalities ranging from conservative to surgical.  She has begun some conservative treatments with bracing, her symptoms are refractory to this care at this juncture.  I would like her to begin some occupational therapy for the right upper extremity with nerve glides, she will return in approximately 6 weeks for recheck.  At that juncture, should symptoms remain refractory  conservative care, we could consider right carpal tunnel release.  She expressed understanding.   Follow-up: Return in about 6 weeks (around 10/16/2023).   Meds & Orders: No orders of the defined types were placed in this encounter.   Orders Placed This Encounter  Procedures   Ambulatory referral to Occupational Therapy     Procedures: No procedures performed      Clinical History: No specialty comments available.  She reports that she has never smoked. She has never used smokeless tobacco.  Recent Labs    09/16/22 1036  HGBA1C 5.3    Objective:   Vital Signs: LMP 04/26/2017   Physical Exam  Gen: Well-appearing, in no acute distress; non-toxic CV: Regular Rate. Well-perfused. Warm.  Resp: Breathing unlabored on room air; no wheezing. Psych: Fluid speech in conversation; appropriate affect; normal thought process  Ortho Exam PHYSICAL EXAM:  General: Patient is well appearing and in no distress. Cervical spine mobility is full in all directions:  Skin and Muscle: No significant skin changes are apparent to right upper extremity.  Range of Motion and Palpation Tests: Mobility is full about the elbows with flexion and extension. Forearm supination and pronation are 85/85 bilaterally.  Wrist flexion/extension is 75/65 bilaterally.  Digital flexion and extension are full.  Thumb opposition is full to the base of the small fingers bilaterally.    No cords or nodules are palpated.  No triggering is observed.     Neurologic, Vascular, Motor: Sensation is diminished to light touch in the right median nerve  distribution.    Thenar atrophy: Mild right side Tinel sign: Positive right carpal tunnel Carpal tunnel compression: Mildly positive right carpal tunnel Phalen test: Mildly positive right carpal tunnel  Sensory right hand 2-point discrimination (thumb, index, middle): 5-6 mm  Motor right hand FPL: 5/5 Index FDP: 5/5 APB: 4+/5  Fingers pink and well perfused.   Capillary refill is brisk.     Lab Results  Component Value Date   HGBA1C 5.3 09/16/2022     Imaging: No results found.   Past Medical/Family/Surgical/Social History: Medications & Allergies reviewed per EMR, new medications updated. 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 Loss of central vision bilaterally.      Legally blind    Leber`s Optic Neuropathy - Dx at age 25, resulting in blindness, followed at [facility]   Nephrolithiasis    Sarcoidosis     - followed by Dr. Emily Filbert, [facility], [phone]   Family History  Problem Relation Age of Onset   Hypertension Mother    Lung cancer Mother    Pneumonia Mother    Alcohol abuse Father    Lung cancer Father    Hypertension Father    Cystic fibrosis Sister    Diabetes Sister    Hypertension Sister    Diabetes Maternal Aunt    Diabetes Paternal Aunt    Diabetes Maternal Grandmother    Hypertension Son    Colon polyps Neg Hx    Esophageal cancer Neg Hx    Rectal cancer Neg Hx    Stomach cancer Neg Hx    Past Surgical History:  Procedure Laterality Date   ANKLE FRACTURE SURGERY Left    CESAREAN SECTION     GANGLION CYST EXCISION Left 12/15/2021   Procedure: LEFT DORSAL GANGLION CYST EXCISION;  Surgeon: Marlyne Beards, MD;  Location: [facility];  Service: Orthopedics;  Laterality: Left;    ROBOTIC ASSISTED TOTAL HYSTERECTOMY WITH SALPINGECTOMY Bilateral 05/10/2017   Procedure: ROBOTIC ASSISTED TOTAL HYSTERECTOMY WITH SALPINGECTOMY;  Surgeon: Genia Del, MD;  Location: [facility];  Service: Gynecology;  Laterality: Bilateral;  laparoscopic  Request 7:30am OR start time in [place] [facility] block time  Requests 2 1/2 hours Dr. would like to use Fenestrated Bipolar (in cupboard Rm 7. Blue cord there also)   Social History   Occupational History   Occupation: Indutries of the [employer]  Tobacco Use   Smoking status: Never   Smokeless tobacco: Never  Vaping Use   Vaping status: Never Used  Substance and Sexual Activity   Alcohol use: Yes    Comment: social   Drug use: No   Sexual activity: Not Currently    Partners: Male    Birth control/protection: Surgical, Abstinence    Comment: hysterectomy, younger than 15, partners 5    Crystina Borrayo (Fara Boros) Denese Killings, M.D. [facility]

## 2023-09-13 NOTE — Therapy (Signed)
 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      Legally blind    Leber`s Optic Neuropathy - Dx at age 51, resulting in blindness, followed at [facility]   Nephrolithiasis    Sarcoidosis     - followed by Dr. Emily Filbert, [facility], [phone]   Past Surgical History:  Procedure Laterality Date   ANKLE FRACTURE SURGERY Left    CESAREAN SECTION     GANGLION CYST EXCISION Left 12/15/2021   Procedure: LEFT DORSAL GANGLION CYST EXCISION;  Surgeon: Marlyne Beards, MD;  Location: [facility];  Service: Orthopedics;  Laterality: Left;   ROBOTIC ASSISTED TOTAL HYSTERECTOMY WITH SALPINGECTOMY Bilateral 05/10/2017   Procedure: ROBOTIC ASSISTED TOTAL HYSTERECTOMY WITH SALPINGECTOMY;  Surgeon: Genia Del, MD;  Location: [facility];  Service: Gynecology;  Laterality: Bilateral;  laparoscopic  Request 7:30am OR start time in [place] [facility] block time  Requests 2 1/2 hours Dr. would like to use Fenestrated Bipolar (in cupboard Rm 7. 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  Rationale for Evaluation and Treatment: Rehabilitation  SUBJECTIVE:   SUBJECTIVE STATEMENT: She states ***.    Pt accompanied by: {accompnied:27141}  PERTINENT HISTORY:  follow-up of right hand numbness and tingling of the radial sided digits primarily at night.  She is legally blind, however she is quite independent and functional at baseline.  She does work with her hands, and does sewing in her spare time.  She states that the symptoms have been present now for over 1 year, worsening in nature.  She has trialed a brace for her nocturnal symptoms without significant relief.     She underwent recently for diagnostic study of the right upper cavity which did show moderate to severe carpal tunnel syndrome of the right upper extremity.  Her symptoms are relatively unchanged from her last visit in January of this year.  PRECAUTIONS: {Therapy precautions:24002}  RED FLAGS: {PT Red Flags:29287}   WEIGHT BEARING RESTRICTIONS: {Yes ***/No:24003}  PAIN:  Are you having pain? {OPRCPAIN:27236}  FALLS: Has patient fallen in last 6 months? {fallsyesno:27318}  LIVING ENVIRONMENT: Lives with: {OPRC lives with:25569::"lives with their family"} Lives in: {Lives in:25570} Stairs: {opstairs:27293} Has following equipment at home: {Assistive devices:23999}  PLOF: {PLOF:24004}  PATIENT GOALS: ***  NEXT MD VISIT: ***  OBJECTIVE: (All objective assessments below are from initial evaluation on: *** unless otherwise specified.)    HAND DOMINANCE: Right ***  ADLs: Overall ADLs: States decreased ability to grab,  hold household objects, pain and difficulty to open containers, perform FMS tasks (manipulate fasteners on clothing), mild to moderate bathing problems as well. ***   FUNCTIONAL OUTCOME MEASURES: Eval: Quick DASH ***% impairment today  (Higher % Score  =  More  Impairment)    Patient Specific Functional Scale: *** (***, ***, ***)  (Higher Score  =  Better Ability for the Selected Tasks)     Patient Rated Wrist Evaluation (PRWE): Pain: ***/50; Function: ***/50; Total Score: ***/100 (Higher Score  =  More Pain and/or Debility)    UPPER EXTREMITY ROM     Shoulder to Wrist AROM Right eval Left eval  Shoulder flexion    Shoulder abduction    Shoulder extension    Shoulder internal rotation    Shoulder external rotation    Elbow flexion    Elbow extension    Forearm supination    Forearm pronation     Wrist flexion    Wrist extension    Wrist ulnar deviation    Wrist radial deviation    Functional dart thrower's motion (F-DTM) in ulnar flexion    F-DTM in radial extension     (Blank rows = not tested)   Hand AROM Right eval Left eval  Full Fist Ability (or Gap to Distal Palmar Crease)    Thumb Opposition  (Kapandji Scale)     Thumb MCP (0-60)    Thumb IP (0-80)    Thumb Radial Abduction Span     Thumb Palmar Abduction Span     Index MCP (0-90)     Index PIP (0-100)     Index DIP (0-70)      Long MCP (0-90)      Long PIP (0-100)      Long DIP (0-70)      Ring MCP (0-90)      Ring PIP (0-100)      Ring DIP (0-70)      Little MCP (0-90)      Little PIP (0-100)      Little DIP (0-70)      (Blank rows = not tested)   UPPER EXTREMITY MMT:    Eval: *** NT at eval due to recent and still healing injuries. Will be tested when appropriate.   MMT Right TBD Left TBD  Shoulder flexion    Shoulder abduction    Shoulder adduction    Shoulder extension    Shoulder internal rotation    Shoulder external rotation    Middle trapezius    Lower trapezius    Elbow flexion    Elbow extension    Forearm supination    Forearm pronation    Wrist flexion    Wrist extension    Wrist ulnar deviation    Wrist radial deviation    (Blank rows = not tested)  HAND FUNCTION: Eval: Observed weakness in affected *** hand.  Grip  strength Right: *** lbs, Left: *** lbs   COORDINATION: Eval: Observed coordination impairments with affected *** hand. Box and Blocks Test: *** Blocks today (*** is [facility]); 9 Hole Peg Test Right: ***sec, Left: *** sec (*** sec is WFL)   SENSATION: Eval: *** Light touch intact today, though diminished around sx area    EDEMA:   Eval: *** Mildly swollen in *** hand and wrist today, ***cm circumferentially around ***  COGNITION: Eval: Overall cognitive status: WFL for evaluation today ***  OBSERVATIONS:   Eval: ***   TODAY'S TREATMENT:  Post-evaluation treatment: ***  Modalities: {OPRCMODALITIES:31717}  PATIENT EDUCATION: Education details: See tx section above for details  Person educated: Patient Education method: Verbal Instruction, Teach back, Handouts  Education comprehension: States and demonstrates understanding, Additional Education required    HOME EXERCISE PROGRAM: See tx section above for details    GOALS: Goals reviewed with patient? Yes   SHORT TERM GOALS: (STG required if POC>30 days) Target Date: ***  Pt will obtain protective, custom orthotic. Goal status: TBD/PRN,  MET ***  2.  Pt will demo/state understanding of initial HEP to improve pain levels and prerequisite motion. Goal status: INITIAL   LONG TERM GOALS: Target Date: ***  Pt will improve functional ability by decreased impairment per Quick DASH / PSFS / PRWE assessment from *** to *** or better, for better quality of life. Goal status: INITIAL  2.  Pt will improve grip strength in *** hand from ***lbs to at least ***lbs for functional use at home and in IADLs. Goal status: INITIAL  3.  Pt will improve A/ROM in *** from *** to at least ***, to have functional motion for tasks like reach and grasp.  Goal status: INITIAL  4.  Pt will improve strength in *** from *** MMT to at least *** MMT to have increased functional ability to carry out selfcare and higher-level homecare tasks with  less difficulty. Goal status: INITIAL  5.  Pt will improve coordination skills in ***, as seen by within functional limit score on *** testing to have increased functional ability to carry out fine motor tasks (fasteners, etc.) and more complex, coordinated IADLs (meal prep, sports, etc.).  Goal status: INITIAL  6.  Pt will decrease pain at worst from ***/10 to ***/10 or better to have better sleep and occupational participation in daily roles. Goal status: INITIAL   ASSESSMENT:  CLINICAL IMPRESSION: Patient is a *** y.o. *** who was seen today for occupational therapy evaluation for ***.   PERFORMANCE DEFICITS: in functional skills including {OT physical skills:25468}, cognitive skills including {OT cognitive skills:25469}, and psychosocial skills including {OT psychosocial skills:25470}.   IMPAIRMENTS: are limiting patient from {OT performance deficits:25471}.   COMORBIDITIES: {Comorbidities:25485} that affects occupational performance. Patient will benefit from skilled OT to address above impairments and improve overall function.  MODIFICATION OR ASSISTANCE TO COMPLETE EVALUATION: {OT modification:25474}  OT OCCUPATIONAL PROFILE AND HISTORY: {OT PROFILE AND HISTORY:25484}  CLINICAL DECISION MAKING: {OT CDM:25475}  REHAB POTENTIAL: {rehabpotential:25112}  EVALUATION COMPLEXITY: {Evaluation complexity:25115}      PLAN:  OT FREQUENCY: {rehab frequency:25116}  OT DURATION: {rehab duration:25117}  PLANNED INTERVENTIONS: {OT Interventions:25467}  RECOMMENDED OTHER SERVICES: ***  CONSULTED AND AGREED WITH PLAN OF CARE: {ZHY:86578}  PLAN FOR NEXT SESSION: ***   Fannie Knee, OTR/L, CHT 09/13/2023, 11:00 AM

## 2023-09-14 ENCOUNTER — Encounter: Payer: Self-pay | Admitting: Rehabilitative and Restorative Service Providers"

## 2023-09-14 ENCOUNTER — Ambulatory Visit: Admitting: Rehabilitative and Restorative Service Providers"

## 2023-09-14 DIAGNOSIS — R202 Paresthesia of skin: Secondary | ICD-10-CM

## 2023-09-14 DIAGNOSIS — M25631 Stiffness of right wrist, not elsewhere classified: Secondary | ICD-10-CM

## 2023-09-14 NOTE — Patient Instructions (Signed)
 Management of hand/arm numbness or "paresthesia"  Peripheral nerves do heal and regenerate- IF you are good to them and change habits/routines.   How you will be successful:  Change habits/routines, prevent "kinking the hose" and any numbness Exercise program to "unstick" nerves, loosen tightness in body Protect with supportive bracing as helpful  Avoid:  Repetitive or BAD prolonged postures Avoid "kinks" at neck, shoulder, elbow, forearm and wrist!! Generally, keep hands DOWN at night. Arm above head = bad Try to keep elbows at 90* or straighter  Don't bend or extend wrists extremely  Have pillow support for head/neck in neutral position  Watch resting with palm down and wrist flexed ALL THE TIME Things that hurt or cause numbness Direct pressure on "nerve points" For example: sleeping on hands, pressure while typing, resting wrist on steering wheel, resting elbow on center console, etc.   Specifics:  Change your sleep habits! Start every day with a non-painful stretch routine and gentle nerve "gliding" assigned by your therapist.  Stretch again, twice that day.   It's smart to gently warmup your body and stretch before doing typically stressful activities (or before bed if your hands hurt/go numb in the night).  If you must do repetitive or stressful activities, or can't break your sleep habits, try some type of support (wrist braces in night, etc.). Using an extra pillow and putting your hand in the pillowcase could help, too. Don't hold your phone to long! Using handles with padding, padded gloves can help with compression or vibration. Take breaks for rest and recovery; don't "force" yourself to finish a job.   Keep hands covered and warm in the winter or cool, rainy weather.

## 2023-09-27 ENCOUNTER — Encounter: Admitting: Rehabilitative and Restorative Service Providers"

## 2023-10-04 NOTE — Therapy (Signed)
 OUTPATIENT OCCUPATIONAL THERAPY TREATMENT NOTE   Patient Name: Allison Steele MRN: [id number] DOB:Jan 15, 1971, 53 y.o., female Today's Date: 10/05/2023  PCP: Alene Husk MD REFERRING PROVIDER: Merrill Abide, MD   END OF SESSION:  OT End of Session - 10/05/23 1509     Visit Number 2    Number of Visits 5    Date for OT Re-Evaluation 10/27/23    Authorization Type [employer]    OT Start Time 1515    OT Stop Time 1553    OT Time Calculation (min) 38 min    Equipment Utilized During Treatment orthotic materials    Activity Tolerance Patient tolerated treatment well;No increased pain    Behavior During Therapy WFL for tasks assessed/performed              Past Medical History:  Diagnosis Date   Anemia    3 MONTHS AGO, STARTED ON IRON PILLS   Depression    GERD (gastroesophageal reflux disease)    Headache    Hypertension    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 Loss of central vision bilaterally.      Legally blind    Leber`s Optic Neuropathy - Dx at age 59, resulting in blindness, followed at [facility]   Nephrolithiasis    Sarcoidosis     - followed by Dr. Joanne Muckle, [facility], [phone]   Past Surgical History:  Procedure Laterality Date   ANKLE FRACTURE SURGERY Left    CESAREAN SECTION     GANGLION CYST EXCISION Left 12/15/2021   Procedure: LEFT DORSAL GANGLION CYST EXCISION;  Surgeon: Marilyn Shropshire, MD;  Location: [facility];  Service: Orthopedics;  Laterality: Left;   ROBOTIC ASSISTED TOTAL HYSTERECTOMY WITH SALPINGECTOMY Bilateral 05/10/2017   Procedure: ROBOTIC ASSISTED TOTAL HYSTERECTOMY WITH SALPINGECTOMY;  Surgeon: Lavoie, Marie-Lyne, MD;  Location: [facility];  Service: Gynecology;  Laterality: Bilateral;  laparoscopic  Request 7:30am OR start time in [place] [facility] block time  Requests 2 1/2 hours Dr. would like to use Fenestrated Bipolar (in cupboard Rm 7.  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  She is legally blind, however she is quite independent and functional at baseline.  She does work with her hands, and does sewing in her spare time.  She states that the symptoms have been present now for over 1 year, worsening in nature.  She has trialed a brace for her nocturnal symptoms without significant relief.     She underwent recently for diagnostic study of the right upper cavity which did show moderate to severe carpal tunnel syndrome of the right upper extremity.  Her symptoms are relatively unchanged from her last visit in January of this year. She states starting about a year ago, she started to have nighttime  paresthesia and in the mornings that felt painful, sometimes made it hard to sleep, made her hand feel numb and difficult to move.  This has progressively been getting worse.  She states noting that it is related to the way she sleeps at night, but she is unsure what to do.  She is legally blind and works for [employer].    PRECAUTIONS: None  RED FLAGS: None   WEIGHT BEARING RESTRICTIONS: No    SUBJECTIVE:   SUBJECTIVE STATEMENT: She arrives after 3 weeks of not being treated, had to miss last week due to an appointment  conflict.  Today she states that initially the program was helping her, but then she stopped doing her exercises and she started to wake up and have nighttime paresthesia again.  She still states that she only has paresthesia and pain in the night, and she has not got any kind of supportive bracing yet.   PAIN:  Are you having pain?   No pain but rates numbness "moderate" on average in past week   FALLS: Has patient fallen in last 6 months? No  PLOF: Independent  PATIENT GOALS: To improve paresthesia, sleep, symptoms.  NEXT MD VISIT: As needed   OBJECTIVE: (All objective assessments below are from initial evaluation on: 09/14/23 unless otherwise specified.)   HAND DOMINANCE: Right   ADLs: Overall ADLs: States decreased ability to sleep and do some work tasks    FUNCTIONAL OUTCOME MEASURES: Eval: Patient Specific Functional Scale: 7 (sleep, open jars, work)  (Higher Score  =  Better Ability for the Selected Tasks)      UPPER EXTREMITY ROM     Shoulder to Wrist AROM Right eval Left eval Rt 10/05/23  Shoulder flexion     Shoulder abduction     Shoulder extension     Shoulder internal rotation     Shoulder external rotation     Elbow flexion     Elbow extension     Forearm supination 77    Forearm pronation  90     Wrist flexion 70 51 71  Wrist extension 61 70 65  (Blank rows = not tested)   Hand AROM Right eval  Full Fist Ability (or Gap to Distal Palmar Crease) full  Thumb Opposition  (Kapandji Scale)  10/10  (Blank rows = not tested)   UPPER EXTREMITY MMT:     MMT Right 09/14/23  Shoulder flexion   Shoulder abduction   Shoulder adduction   Shoulder extension   Shoulder internal rotation   Shoulder external rotation   Middle trapezius   Lower trapezius   Elbow flexion   Elbow extension   Forearm supination   Forearm pronation   Wrist flexion 5/5  Wrist extension 5/5  Wrist ulnar deviation   Wrist radial deviation   (Blank rows = not tested)  HAND  FUNCTION: 10/05/23: Grip Rt: 78#    Eval: Observed weakness in affected Rt hand.  Grip strength Right: 49 lbs, Left: 68 lbs   COORDINATION: Eval: Observed coordination impairments with affected Rt hand. 9 Hole Peg Test Right: 41sec  (24 sec is [facility])    OT feels that this issue is mostly due to low vision.   OBSERVATIONS:   Eval: No tenderness, no pain, only nighttime paresthesia complaints with some occasional clumsiness while at work, dropping things sometimes though this may be more vision related.   TODAY'S TREATMENT:  10/05/23: Active range of motion measures show a very mild improvement to  the right wrist today, and OT assure that if the program was working initially, it would still be if she had maintained doing her exercises every day as was asked of her.  OT has her lie down to show and demonstrate her normal sleeping posture which include her shoulder looking pinched beneath her body and drooping over her when she rolls on her opposite side.  OT shows her how to modify these positions by using a pillow for support.  Next, as she has not gotten a brace for the night, OT custom fabricates and orthotic wrist cock up to not allow her to have pressure on her median nerve at the carpal tunnel in the nighttime.  It fits well, she demonstrates ability to put it on and take it off herself, and states that she will wear it every night and bring it back in for any adjustments.  Lastly, we review nerve gliding and other home exercise program and OT recommends her to do this every day bare minimum twice a day or 3 times a day.  She states that she will try her best to do this for 1 week and return next week and there should be positive changes noted.      PATIENT EDUCATION: Education details: See tx section above for details  Person educated: Patient Education method: Verbal Instruction, Teach back, Handouts  Education comprehension: States and demonstrates understanding, Additional Education required     HOME EXERCISE PROGRAM: Access Code: [email or web address] URL: [email or web address] Date: 09/14/2023 Prepared by: Leartis Proud   GOALS: Goals reviewed with patient? Yes   SHORT TERM GOALS: (STG required if POC>30 days) Target Date: 09/29/2023  Pt will obtain protective, custom orthotic. Goal status: 10/05/23: MET  2.  Pt will demo/state understanding of initial HEP to improve pain levels and prerequisite motion. Goal status: 10/05/23: MET   LONG TERM GOALS: Target Date: 10/27/2023  Pt will improve functional ability by decreased impairment per PSFS assessment from 7 to 9 or better, for better quality of life. Goal status: INITIAL  2.  Pt will improve grip strength in right hand from 49 lbs to at least 55 lbs for functional use at home and in IADLs. Goal status: INITIAL  3.  Pt will improve A/ROM in right wrist extension from 61 degrees to at least 68 degrees, to have functional motion for tasks like reach and grasp.  Goal status: INITIAL  4.  Pt will decrease numbness on average during the week from "moderate" to "mild" or better, to have better sleep and occupational participation in daily roles. Goal status: INITIAL   ASSESSMENT:  CLINICAL IMPRESSION: 10/05/23: Unfortunately she was not consistent with her home exercise program, did not get or wear her nighttime brace, and her symptoms have remained.  OT now would like to see her more frequently, at least once a week, and has made her a custom orthosis to prevent nerve impingement in the night.  These things should make a difference when she comes in next week.     PLAN:  OT FREQUENCY: As needed up to once a week  OT DURATION: 6 weeks through 10/27/2023 and up to 5 total visits as needed  PLANNED INTERVENTIONS: 97168 OT Re-evaluation, 97535 self care/ADL training, 19147 therapeutic exercise, 97530 therapeutic activity, 97112 neuromuscular re-education, 97140 manual therapy, 97035 ultrasound, 97010 moist heat,  97010 cryotherapy, 97760 Orthotic Initial, 97763 Orthotic/Prosthetic subsequent, passive range of motion, Dry needling, energy conservation, coping strategies training, patient/family education, and DME and/or AE instructions  RECOMMENDED OTHER SERVICES: None now  CONSULTED AND AGREED WITH PLAN OF CARE: Patient  PLAN FOR NEXT SESSION:   Check orthosis, check symptoms, consider seeing back the following week before the surgeon follows up with her.  Leartis Proud, OTR/L, CHT 10/05/2023, 4:46 PM

## 2023-10-05 ENCOUNTER — Ambulatory Visit: Admitting: Rehabilitative and Restorative Service Providers"

## 2023-10-05 ENCOUNTER — Encounter: Payer: Self-pay | Admitting: Rehabilitative and Restorative Service Providers"

## 2023-10-05 DIAGNOSIS — R202 Paresthesia of skin: Secondary | ICD-10-CM

## 2023-10-11 ENCOUNTER — Encounter: Payer: Self-pay | Admitting: Rehabilitative and Restorative Service Providers"

## 2023-10-11 ENCOUNTER — Ambulatory Visit: Admitting: Rehabilitative and Restorative Service Providers"

## 2023-10-11 DIAGNOSIS — M25631 Stiffness of right wrist, not elsewhere classified: Secondary | ICD-10-CM | POA: Diagnosis not present

## 2023-10-11 DIAGNOSIS — R202 Paresthesia of skin: Secondary | ICD-10-CM | POA: Diagnosis not present

## 2023-10-11 NOTE — Therapy (Addendum)
 " OUTPATIENT OCCUPATIONAL THERAPY TREATMENT & DISCHARGE NOTE   Patient Name: Allison Steele MRN: [id number] DOB:1970-07-30, 53 y.o., female Today's Date: 10/11/2023  PCP: Delores MOTE MD REFERRING PROVIDER: Arlinda Buster, MD                        OCCUPATIONAL THERAPY DISCHARGE SUMMARY  Visits from Start of Care: 3  Unfortunately the patient did not return after this treatment session, so long-term goals will all be considered MET.  Please see details of this note for any information as needed.  She is officially discharged from this plan of care at this time.  Melvenia Ada, OTR/L, CHT 06/24/24                END OF SESSION:  OT End of Session - 10/11/23 1608     Visit Number 3    Number of Visits 5    Date for OT Re-Evaluation 10/27/23    Authorization Type [employer] Medicare    OT Start Time 1608    OT Stop Time 1625    OT Time Calculation (min) 17 min    Equipment Utilized During Treatment --    Activity Tolerance Patient tolerated treatment well;No increased pain    Behavior During Therapy WFL for tasks assessed/performed               Past Medical History:  Diagnosis Date   Anemia    3 MONTHS AGO, STARTED ON IRON PILLS   Depression    GERD (gastroesophageal reflux disease)    Headache    Hypertension    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      Legally blind    Leber`s Optic Neuropathy - Dx at age 42, resulting in blindness, followed at [facility]   Nephrolithiasis    Sarcoidosis     - followed by Dr. Robinson, [facility], [phone]   Past Surgical History:  Procedure Laterality Date   ANKLE FRACTURE SURGERY Left    CESAREAN SECTION     GANGLION CYST EXCISION Left 12/15/2021   Procedure: LEFT DORSAL GANGLION CYST EXCISION;  Surgeon: Romona Harari, MD;  Location:  SURGERY CENTER;  Service:  Orthopedics;  Laterality: Left;   ROBOTIC ASSISTED TOTAL HYSTERECTOMY WITH SALPINGECTOMY Bilateral 05/10/2017   Procedure: ROBOTIC ASSISTED TOTAL HYSTERECTOMY WITH SALPINGECTOMY;  Surgeon: Lavoie, Marie-Lyne, MD;  Location: [facility];  Service: Gynecology;  Laterality: Bilateral;  laparoscopic  Request 7:30am OR start time in [place] [facility] block time  Requests 2 1/2 hours Dr. would like to use Fenestrated Bipolar (in cupboard Rm 7. 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  She is legally blind, however  she is quite independent and functional at baseline.  She does work with her hands, and does sewing in her spare time.  She states that the symptoms have been present now for over 1 year, worsening in nature.  She has trialed a brace for her nocturnal symptoms without significant relief.     She underwent recently for diagnostic study of the right upper cavity which did show moderate to severe carpal tunnel syndrome of the right upper extremity.  Her symptoms are relatively unchanged from her last visit in January of this year. She states starting about a year ago, she started to have nighttime paresthesia and in the mornings that felt painful, sometimes  made it hard to sleep, made her hand feel numb and difficult to move.  This has progressively been getting worse.  She states noting that it is related to the way she sleeps at night, but she is unsure what to do.  She is legally blind and works for [employer].    PRECAUTIONS: None  RED FLAGS: None   WEIGHT BEARING RESTRICTIONS: No    SUBJECTIVE:   SUBJECTIVE STATEMENT: She states orthosis is working well, she is feeling amazing it does need a small adjustment.  She also states she would like to not come once a week to help save money on co-pays.   PAIN:  Are you having pain?     No pain or numbness now, and has only been mild in the past week when she took off her brace at night 1 time.  FALLS: Has patient fallen in last 6 months? No  PLOF: Independent  PATIENT GOALS: To improve paresthesia, sleep, symptoms.  NEXT MD VISIT: As needed   OBJECTIVE: (All objective assessments below are from initial evaluation on: 09/14/23 unless otherwise specified.)   HAND DOMINANCE: Right   ADLs: Overall ADLs: States decreased ability to sleep and do some work tasks    FUNCTIONAL OUTCOME MEASURES: Eval: Patient Specific Functional Scale: 7 (sleep, open jars, work)  (Higher Score  =  Better Ability for the Selected Tasks)      UPPER EXTREMITY ROM     Shoulder to Wrist AROM Right eval Left eval Rt 10/05/23 Rt 10/11/23  Shoulder flexion      Shoulder abduction      Shoulder extension      Shoulder internal rotation      Shoulder external rotation      Elbow flexion      Elbow extension      Forearm supination 77   85  Forearm pronation  90    82  Wrist flexion 70 51 71 65  Wrist extension 61 70 65 69  (Blank rows = not tested)   Hand AROM Right eval  Full Fist Ability (or Gap to Distal Palmar Crease) full  Thumb Opposition  (Kapandji Scale)  10/10  (Blank rows = not tested)   UPPER EXTREMITY MMT:     MMT Right 09/14/23  Shoulder flexion   Shoulder  abduction   Shoulder adduction   Shoulder extension   Shoulder internal rotation   Shoulder external rotation   Middle trapezius   Lower trapezius   Elbow flexion   Elbow extension   Forearm supination   Forearm pronation   Wrist flexion 5/5  Wrist extension 5/5  Wrist ulnar deviation   Wrist radial deviation   (Blank rows = not tested)  HAND FUNCTION: 10/05/23: Grip Rt: 78#    Eval: Observed weakness  in affected Rt hand.  Grip strength Right: 49 lbs, Left: 68 lbs   COORDINATION: Eval: Observed coordination impairments with affected Rt hand. 9 Hole Peg Test Right: 41sec  (24 sec is [facility])    OT feels that this issue is mostly due to low vision.   OBSERVATIONS:   Eval: No tenderness, no pain, only nighttime paresthesia complaints with some occasional clumsiness while at work, dropping things sometimes though this may be more vision related.   TODAY'S TREATMENT:  10/11/23: OT modifies her orthosis to add some padding and make it more comfortable.  We review her home exercises and OT also educates her on eccentric bicep contractions to help floss the median nerve as well.  She tolerates it with 10 pounds today as she has 10 pound weights at home.  She is doing well with her nerve glides and wrist stretches, and she states understanding of plan for going forward.  She would like to put out her next appointment for 2 weeks to help with her co-pay situation, and OT tells her that if she is having no paresthesia or problems she can even cancel this to save herself some money.  We discussed how she can manage this problem in the long run if she does not return to therapy-this includes using her orthosis as needed or simply trying to keep her hand and wrist more straight at night, not lie on it, continue wrist stretches, continue nerve gliding in the day.  She is very happy with where she is now, and she will return as needed.   PATIENT EDUCATION: Education details: See tx section above for  details  Person educated: Patient Education method: Verbal Instruction, Teach back, Handouts  Education comprehension: States and demonstrates understanding, Additional Education required    HOME EXERCISE PROGRAM: Access Code: [email or web address] URL: [email or web address] Date: 09/14/2023 Prepared by: Melvenia Ada   GOALS: Goals reviewed with patient? Yes   SHORT TERM GOALS: (STG required if POC>30 days) Target Date: 09/29/2023  Pt will obtain protective, custom orthotic. Goal status: 10/05/23: MET  2.  Pt will demo/state understanding of initial HEP to improve pain levels and prerequisite motion. Goal status: 10/05/23: MET   LONG TERM GOALS: Target Date: 10/27/2023  Pt will improve functional ability by decreased impairment per PSFS assessment from 7 to 9 or better, for better quality of life. Goal status: INITIAL  2.  Pt will improve grip strength in right hand from 49 lbs to at least 55 lbs for functional use at home and in IADLs. Goal status: INITIAL  3.  Pt will improve A/ROM in right wrist extension from 61 degrees to at least 68 degrees, to have functional motion for tasks like reach and grasp.  Goal status: INITIAL  4.  Pt will decrease numbness on average during the week from moderate to mild or better, to have better sleep and occupational participation in daily roles. Goal status: INITIAL   ASSESSMENT:  CLINICAL IMPRESSION: 10/11/23: She is back on track, states she is doing amazing.  She just needs to stay consistent    PLAN:  OT FREQUENCY: As needed up to once a week  OT DURATION: 6 weeks through 10/27/2023 and up to 5 total visits as needed  PLANNED INTERVENTIONS: 97168 OT Re-evaluation, 97535 self care/ADL training, 02889 therapeutic exercise, 97530 therapeutic activity, 97112 neuromuscular re-education, 97140 manual therapy, 97035 ultrasound, 97010 moist heat, 97010 cryotherapy, 97760 Orthotic Initial, 97763 Orthotic/Prosthetic subsequent,  passive range of motion, Dry needling, energy conservation, coping strategies  training, patient/family education, and DME and/or AE instructions  CONSULTED AND AGREED WITH PLAN OF CARE: Patient  PLAN FOR NEXT SESSION:    See back in 2 weeks to discuss symptoms and provide any additional solutions as needed, otherwise discharge   Melvenia Ada, OTR/L, CHT 10/11/2023, 4:32 PM    "

## 2023-10-16 ENCOUNTER — Ambulatory Visit: Admitting: Orthopedic Surgery

## 2023-10-25 ENCOUNTER — Encounter: Admitting: Rehabilitative and Restorative Service Providers"

## 2023-11-02 ENCOUNTER — Encounter: Admitting: Rehabilitative and Restorative Service Providers"

## 2023-11-30 NOTE — Progress Notes (Signed)
 SUBJECTIVE:   Chief compliant/HPI: annual examination  Allison Steele is a 53 y.o. who presents today for an annual exam.  The patient reports significant concern of dyspnea on exertion.  This has been ongoing for more than a month.  This has worsened with the heat.  She reports that she had a cough back in December for a few weeks that is resolved.  Over the past for the past 3 weeks whenever she walks up more than 3 flights of stairs she has dyspnea and takes her 3 breaths to catch her breath.  This is new.  She has not had this in previous summers.  Her weight has been stable.  She is concerned her sarcoidosis is acting up.  She denies chest pain, arm pain, palpitations cough, fevers.  She denies any lower extremity edema orthopnea or paroxysmal terminal dyspnea.  She has no family history of significant coronary artery disease.  There is no chest pain and no pleuritic type chest pain when she breathes in deeply.  And this resolves after 3 breaths.   Otherwise the patient is doing well.  She denies headaches nausea vomiting, melena, hematochezia.  She recently saw her gynecologist. She is also recently seen her pulmonologist but did not tell them about this change in her breathing.  It started just after that visit actually.  History tabs reviewed and updated.   Review of systems form reviewed and notable for as above.   OBJECTIVE:   BP 122/77   Pulse 76   Temp 98.4 F (36.9 C)   Wt 170 lb 9.6 oz (77.4 kg)   LMP 04/26/2017   BMI 30.22 kg/m   HEENT: EOMI. Sclera without injection or icterus. MMM. External auditory canal examined and WNL. TM normal appearance, no erythema or bulging. Neck: Supple.  Cardiac: Regular rate and rhythm. Normal S1/S2. No murmurs, rubs, or gallops appreciated. Lungs: Clear bilaterally to ascultation.  Abdomen: Normoactive bowel sounds. No tenderness to deep or light palpation. No rebound or guarding.   No edema  Psych: Pleasant and appropriate     ASSESSMENT/PLAN:   Assessment & Plan Annual physical exam We discussed healthy eating habits, moderate physical activity for 30 minutes 5 times per week (or 150 minutes), safe sex practices, avoiding tobacco products, safe alcohol consumption, and safe driving habits.   Essential hypertension At goal continue current medications Cutaneous sarcoidosis She will call her pulmonologist. Leber's hereditary optic neuropathy Follows with ophthalmology. Depression, unspecified depression type Patient reports she needs to speak with someone her about her anxiety.  Referral to value-based care is to take.  She previously had a counselor during COVID which she found very helpful.  Continue her Wellbutrin . dyspepsia Refilled PPI Dyspnea on exertion Discussed at length.  Differential includes flare of sarcoidosis, other lung disease including asthma.  Also considered an angina equivalent although I think this is unlikely given absence of other symptoms and risk factors for lung disease as the culprit.  Will check a BMP, CBC for anemia, metabolic panel today.  I will also order chest x-ray.  Recommend she call her pulmonologist.  Return precautions are placed in the after visit summary.   Annual Examination  See AVS for age appropriate recommendations  PHQ score reviewed and discussed.  BP reviewed and at goal .    Considered the following items based upon USPSTF recommendations: Diabetes screening: ordered Screening for elevated cholesterol: ordered Reviewed risk factors for latent tuberculosis and not indicated   Discussed family  history, BRCA testing not indicated. Tool used to risk stratify was Pedigree assessment tool.  Cervical cancer screening: follows with Gyn Breast cancer screening: due in fall Colorectal cancer screening: up to date on screening for CRC.  Suzann CHRISTELLA Daring, MD [facility]

## 2023-12-01 ENCOUNTER — Encounter: Payer: Self-pay | Admitting: Family Medicine

## 2023-12-01 ENCOUNTER — Ambulatory Visit (INDEPENDENT_AMBULATORY_CARE_PROVIDER_SITE_OTHER): Admitting: Family Medicine

## 2023-12-01 VITALS — BP 122/77 | HR 76 | Temp 98.4°F | Wt 170.6 lb

## 2023-12-01 DIAGNOSIS — R0609 Other forms of dyspnea: Secondary | ICD-10-CM

## 2023-12-01 DIAGNOSIS — H4722 Hereditary optic atrophy: Secondary | ICD-10-CM

## 2023-12-01 DIAGNOSIS — I10 Essential (primary) hypertension: Secondary | ICD-10-CM | POA: Diagnosis not present

## 2023-12-01 DIAGNOSIS — D863 Sarcoidosis of skin: Secondary | ICD-10-CM | POA: Diagnosis not present

## 2023-12-01 DIAGNOSIS — Z0000 Encounter for general adult medical examination without abnormal findings: Secondary | ICD-10-CM

## 2023-12-01 DIAGNOSIS — F32A Depression, unspecified: Secondary | ICD-10-CM

## 2023-12-01 DIAGNOSIS — K219 Gastro-esophageal reflux disease without esophagitis: Secondary | ICD-10-CM

## 2023-12-01 MED ORDER — SPIRONOLACTONE 50 MG PO TABS: 50.0000 mg | ORAL_TABLET | Freq: Every day | ORAL | 11 refills | Status: AC

## 2023-12-01 MED ORDER — OLMESARTAN-AMLODIPINE-HCTZ 40-10-12.5 MG PO TABS: 1.0000 | ORAL_TABLET | Freq: Every day | ORAL | 11 refills | Status: AC

## 2023-12-01 MED ORDER — POLYETHYLENE GLYCOL 3350 17 GM/SCOOP PO POWD: 17.0000 g | Freq: Two times a day (BID) | ORAL | 1 refills | Status: AC | PRN

## 2023-12-01 MED ORDER — OMEPRAZOLE 20 MG PO CPDR: DELAYED_RELEASE_CAPSULE | ORAL | 11 refills | Status: AC

## 2023-12-01 MED ORDER — BUPROPION HCL ER (SR) 150 MG PO TB12: 150.0000 mg | ORAL_TABLET | Freq: Two times a day (BID) | ORAL | 11 refills | Status: AC

## 2023-12-01 NOTE — Assessment & Plan Note (Signed)
 Follows with ophthalmology

## 2023-12-01 NOTE — Assessment & Plan Note (Signed)
 At goal continue current medications

## 2023-12-01 NOTE — Patient Instructions (Signed)
 It was wonderful to see you today.  Please bring ALL of your medications with you to every visit.   Today we talked about:  Your blood pressure is great!!  A Child psychotherapist Academic librarian) will call you   Please follow up in 1 month  If your breathing worsens--please call me IMMEDIATELY  Go to the ED if you develop chest pains  I sent in your refills  Your are due for a mammogram in October--please return in Late September  Call your lung doctor  We will check blood work today  An x-ray was ordered for you---you do not need an appointment to have this completed.  I recommend going to [facility] [address] [place]  If the results are normal,I will send you a letter  I will call you with results if anything is abnormal   Call your lung doctor about your breathing    Please follow up in 3 months   Thank you for choosing [facility].   Please call [phone] with any questions about today's appointment.  Please be sure to schedule follow up at the front  desk before you leave today.   Suzann Daring, MD  Family Medicine

## 2023-12-01 NOTE — Assessment & Plan Note (Signed)
 She will call her pulmonologist.

## 2023-12-03 LAB — LIPID PANEL
Chol/HDL Ratio: 3.3 ratio (ref 0.0–4.4)
Cholesterol, Total: 203 mg/dL — ABNORMAL HIGH (ref 100–199)
HDL: 62 mg/dL
LDL Chol Calc (NIH): 121 mg/dL — ABNORMAL HIGH (ref 0–99)
Triglycerides: 114 mg/dL (ref 0–149)
VLDL Cholesterol Cal: 20 mg/dL (ref 5–40)

## 2023-12-03 LAB — HEMOGLOBIN A1C
Est. average glucose Bld gHb Est-mCnc: 120 mg/dL
Hgb A1c MFr Bld: 5.8 % — ABNORMAL HIGH (ref 4.8–5.6)

## 2023-12-03 LAB — BASIC METABOLIC PANEL WITH GFR
BUN/Creatinine Ratio: 16 (ref 9–23)
BUN: 16 mg/dL (ref 6–24)
CO2: 23 mmol/L (ref 20–29)
Calcium: 10.3 mg/dL — ABNORMAL HIGH (ref 8.7–10.2)
Chloride: 101 mmol/L (ref 96–106)
Creatinine, Ser: 0.99 mg/dL (ref 0.57–1.00)
Glucose: 95 mg/dL (ref 70–99)
Potassium: 4 mmol/L (ref 3.5–5.2)
Sodium: 139 mmol/L (ref 134–144)
eGFR: 69 mL/min/{1.73_m2} (ref >59)

## 2023-12-03 LAB — BRAIN NATRIURETIC PEPTIDE: BNP: 4.3 pg/mL (ref 0.0–100.0)

## 2023-12-04 ENCOUNTER — Ambulatory Visit: Payer: Self-pay | Admitting: Family Medicine

## 2023-12-04 NOTE — Telephone Encounter (Signed)
 Called with results. Discussed hyperlipidemia (ASCVD 2.9%) and prediabetes. Recommended lifestyle changes. All questions answered.  Suzann Daring, MD  Family Medicine Teaching Service

## 2023-12-07 ENCOUNTER — Telehealth: Payer: Self-pay | Admitting: *Deleted

## 2023-12-07 NOTE — Progress Notes (Signed)
 Complex Care Management Note Care Guide Note  12/07/2023 Name: Allison Steele MRN: [id number] DOB: 11-Oct-1970   Complex Care Management Outreach Attempts: An unsuccessful telephone outreach was attempted today to offer the patient information about available complex care management services.  Follow Up Plan:  Additional outreach attempts will be made to offer the patient complex care management information and services.   Encounter Outcome:  No Answer  [name]  [facility]  [facility], [facility] Guide  Direct Dial: [phone]  Fax [phone]

## 2023-12-11 ENCOUNTER — Ambulatory Visit
Admission: RE | Admit: 2023-12-11 | Discharge: 2023-12-11 | Disposition: A | Discharge status: Home / Self Care | Source: Ambulatory Visit | Attending: Family Medicine | Admitting: Family Medicine

## 2023-12-11 DIAGNOSIS — R0600 Dyspnea, unspecified: Secondary | ICD-10-CM | POA: Diagnosis not present

## 2023-12-11 DIAGNOSIS — R0609 Other forms of dyspnea: Secondary | ICD-10-CM

## 2023-12-12 NOTE — Progress Notes (Unsigned)
 Complex Care Management Note Care Guide Note  12/12/2023 Name: Allison Steele MRN: [id number] DOB: [birth year]   Complex Care Management Outreach Attempts: A second unsuccessful outreach was attempted today to offer the patient with information about available complex care management services.  Follow Up Plan:  Additional outreach attempts will be made to offer the patient complex care management information and services.   Encounter Outcome:  No Answer  [name]  [facility]  [facility] [address], [facility] Guide  Direct Dial: [phone]  Fax [phone]

## 2023-12-13 NOTE — Progress Notes (Signed)
 Complex Care Management Note Care Guide Note  12/13/2023 Name: AMILLIANA HAYWORTH MRN: [id number] DOB: 1970/11/27   Complex Care Management Outreach Attempts: A third unsuccessful outreach was attempted today to offer the patient with information about available complex care management services.  Follow Up Plan:  No further outreach attempts will be made at this time. We have been unable to contact the patient to offer or enroll patient in complex care management services.  Encounter Outcome:  No Answer  [name]  [facility]  [facility], [facility] Guide  Direct Dial: [phone]  Fax [phone]

## 2024-01-08 ENCOUNTER — Ambulatory Visit (INDEPENDENT_AMBULATORY_CARE_PROVIDER_SITE_OTHER)

## 2024-01-08 VITALS — Ht 63.0 in | Wt 170.0 lb

## 2024-01-08 DIAGNOSIS — Z0000 Encounter for general adult medical examination without abnormal findings: Secondary | ICD-10-CM | POA: Diagnosis not present

## 2024-01-08 NOTE — Progress Notes (Signed)
 Because this visit was a virtual/telehealth visit,  certain criteria was not obtained, such a blood pressure, CBG if applicable, and timed get up and go. Any medications not marked as taking were not mentioned during the medication reconciliation part of the visit. Any vitals not documented were not able to be obtained due to this being a telehealth visit or patient was unable to self-report a recent blood pressure reading due to a lack of equipment at home via telehealth. Vitals that have been documented are verbally provided by the patient.   Subjective:   Allison Steele is a 53 y.o. who presents for a Medicare Wellness preventive visit.  As a reminder, Annual Wellness Visits don't include a physical exam, and some assessments may be limited, especially if this visit is performed virtually. We may recommend an in-person follow-up visit with your provider if needed.  Visit Complete: Virtual I connected with  Allison Steele on 01/08/24 by a audio enabled telemedicine application and verified that I am speaking with the correct person using two identifiers.  Patient Location: Home  Provider Location: Home Office  I discussed the limitations of evaluation and management by telemedicine. The patient expressed understanding and agreed to proceed.  Vital Signs: Because this visit was a virtual/telehealth visit, some criteria may be missing or patient reported. Any vitals not documented were not able to be obtained and vitals that have been documented are patient reported.  VideoDeclined- This patient declined Librarian, academic. Therefore the visit was completed with audio only.  Persons Participating in Visit: Patient.  AWV Questionnaire: No: Patient Medicare AWV questionnaire was not completed prior to this visit.  Cardiac Risk Factors include: advanced age (>6men, >68 women);sedentary lifestyle;hypertension;family history of premature cardiovascular  disease;obesity (BMI >30kg/m2)     Objective:    Today's Vitals   01/08/24 1623  Weight: 170 lb (77.1 kg)  Height: 5' 3 (1.6 m)  PainSc: 0-No pain   Body mass index is 30.11 kg/m.     01/08/2024    4:28 PM 09/14/2023    3:34 PM 01/06/2023    8:45 AM 06/01/2022    8:31 AM 12/15/2021    7:20 AM 10/16/2021    8:41 AM 08/30/2021    8:23 AM  Advanced Directives  Does Patient Have a Medical Advance Directive? No No No No No No No  Would patient like information on creating a medical advance directive? Yes (MAU/Ambulatory/Procedural Areas - Information given) Yes (MAU/Ambulatory/Procedural Areas - Information given) No - Patient declined No - Patient declined No - Patient declined No - Patient declined No - Patient declined    Current Medications (verified) Outpatient Encounter Medications as of 01/08/2024  Medication Sig   aluminum chloride (DRYSOL) 20 % external solution Apply topically at bedtime.   buPROPion  (WELLBUTRIN  SR) 150 MG 12 hr tablet Take 1 tablet (150 mg total) by mouth 2 (two) times daily.   fluticasone  (FLONASE ) 50 MCG/ACT nasal spray Place 2 sprays into both nostrils daily.   Olmesartan -amLODIPine -HCTZ 40-10-12.5 MG TABS Take 1 tablet by mouth daily.   omeprazole  (PRILOSEC) 20 MG capsule TAKE 1 CAPSULE BY MOUTH EVERY DAY   polyethylene glycol powder (GLYCOLAX /MIRALAX ) 17 GM/SCOOP powder Take 17 g by mouth 2 (two) times daily as needed.   spironolactone  (ALDACTONE ) 50 MG tablet Take 1 tablet (50 mg total) by mouth daily.   No facility-administered encounter medications on file as of 01/08/2024.    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 Robinson, [facility], [phone]   Past Surgical History:  Procedure Laterality Date   ANKLE FRACTURE SURGERY Left    CESAREAN SECTION     GANGLION CYST EXCISION Left 12/15/2021   Procedure: LEFT DORSAL GANGLION CYST EXCISION;  Surgeon: Romona Harari, MD;  Location: [facility];  Service: Orthopedics;  Laterality: Left;   ROBOTIC ASSISTED TOTAL HYSTERECTOMY WITH SALPINGECTOMY Bilateral 05/10/2017   Procedure: ROBOTIC ASSISTED TOTAL HYSTERECTOMY WITH SALPINGECTOMY;  Surgeon: Lavoie, Marie-Lyne, MD;  Location: [facility];  Service: Gynecology;  Laterality: Bilateral;  laparoscopic  Request 7:30am OR start time in [place] [facility] block time  Requests 2 1/2 hours Dr. would like to use Fenestrated Bipolar (in cupboard Rm 7. Blue cord there also)   Family History  Problem Relation Age of Onset   Hypertension Mother    Lung cancer Mother    Pneumonia Mother    Alcohol abuse Father    Lung cancer Father    Hypertension Father    Cystic fibrosis Sister    Diabetes Sister    Hypertension Sister    Diabetes Maternal Aunt    Diabetes Paternal Aunt    Diabetes Maternal Grandmother    Hypertension Son    Colon polyps Neg Hx    Esophageal cancer Neg Hx    Rectal cancer Neg Hx    Stomach cancer Neg Hx    Social History   Socioeconomic History   Marital status: Divorced    Spouse name: Not on file   Number of children: 1   Years of education: 12   Highest education level: High school graduate  Occupational History   Occupation: [employer]  Tobacco Use   Smoking status: Never   Smokeless tobacco: Never  Vaping Use   Vaping status: Never Used  Substance and Sexual Activity   Alcohol use: Yes    Comment: social   Drug use: No   Sexual activity: Not Currently    Partners: Male    Birth  control/protection: Surgical, Abstinence    Comment: hysterectomy, younger than 16, partners 5  Other Topics Concern   Not on file  Social History Narrative   Patient lives alone in [place].   Patient has one son in the area.    Patient is legally blind and does not drive. Patient uses [employer] for transportation.    Patient exercises 3x per week for 1 hour.    Enjoys watching movies and reading.    Works at [employer] for [employer].   Social Drivers of Corporate investment banker Strain: Low Risk  (01/08/2024)   Overall Financial Resource Strain (CARDIA)    Difficulty of Paying Living Expenses: Not hard at all  Food Insecurity: No Food Insecurity (01/08/2024)   Hunger Vital Sign    Worried About Running Out of Food in the Last Year: Never true    Ran Out of Food in the Last  Year: Never true  Transportation Needs: No Transportation Needs (01/08/2024)   PRAPARE - Administrator, Civil Service (Medical): No    Lack of Transportation (Non-Medical): No  Physical Activity: Sufficiently Active (01/08/2024)   Exercise Vital Sign    Days of Exercise per Week: 3 days    Minutes of Exercise per Session: 60 min  Stress: No Stress Concern Present (01/08/2024)   [employer] of Occupational Health - Occupational Stress Questionnaire    Feeling of Stress: Not at all  Social Connections: Moderately Isolated (01/08/2024)   Social Connection and Isolation Panel    Frequency of Communication with Friends and Family: Three times a week    Frequency of Social Gatherings with Friends and Family: Three times a week    Attends Religious Services: More than 4 times per year    Active Member of Clubs or Organizations: No    Attends Banker Meetings: Never    Marital Status: Divorced    Tobacco Counseling Counseling given: Not Answered    Clinical Intake:  Pre-visit preparation completed: Yes  Pain : No/denies pain Pain Score: 0-No pain     BMI - recorded:  30.11 Nutritional Status: BMI > 30  Obese Nutritional Risks: None Diabetes: No  Lab Results  Component Value Date   HGBA1C 5.8 (H) 12/01/2023   HGBA1C 5.3 09/16/2022   HGBA1C 5.4 08/30/2021     How often do you need to have someone help you when you read instructions, pamphlets, or other written materials from your doctor or pharmacy?: 1 - Never  Interpreter Needed?: No  Information entered by :: Liam Cammarata N. Aarik Blank, LPN.   Activities of Daily Living     01/08/2024    4:28 PM  In your present state of health, do you have any difficulty performing the following activities:  Hearing? 0  Vision? 1  Comment LEGALLY BLIND  Difficulty concentrating or making decisions? 0  Walking or climbing stairs? 0  Dressing or bathing? 0  Doing errands, shopping? 0  Preparing Food and eating ? N  Using the Toilet? N  In the past six months, have you accidently leaked urine? N  Do you have problems with loss of bowel control? N  Managing your Medications? N  Managing your Finances? N  Housekeeping or managing your Housekeeping? N    Patient Care Team: Delores Suzann HERO, MD as PCP - General (Family Medicine) Robinson Mayo, OD as Referring Physician (Optometry)  I have updated your Care Teams any recent Medical Services you may have received from other providers in the past year.     Assessment:   This is a routine wellness examination for Meryn.  Hearing/Vision screen Hearing Screening - Comments:: Denies hearing difficulties.  Vision Screening - Comments:: Patient is legally blind.  Patient diagnosed with Leber`s Optic Neuropathy - Dx at age 75, resulting in blindness, followed Mayo Robinson, OD.   Goals Addressed             This Visit's Progress    01/08/2024: My healthcare goal is to get more healthier.         Depression Screen     01/08/2024    4:30 PM 06/13/2023    2:59 PM 10/03/2022    3:36 PM 09/16/2022    8:29 AM 03/11/2022    8:50 AM 08/30/2021    8:46 AM 07/13/2021     9:57 AM  PHQ 2/9 Scores  PHQ - 2 Score 0 0 0 0  0 1 0  PHQ- 9 Score 0   2 0 2     Fall Risk     01/08/2024    4:27 PM 07/31/2023    3:55 PM 01/06/2023    8:44 AM 10/03/2022    3:36 PM 08/30/2021    8:23 AM  Fall Risk   Falls in the past year? 0 0 0 0 0  Number falls in past yr: 0  0 0 0  Injury with Fall? 0  0 0 0  Risk for fall due to : No Fall Risks   No Fall Risks   Follow up Falls evaluation completed   Falls evaluation completed     MEDICARE RISK AT HOME:  Medicare Risk at Home Any stairs in or around the home?: No (1 STEP FRONT ENTRANCE) If so, are there any without handrails?: No Home free of loose throw rugs in walkways, pet beds, electrical cords, etc?: Yes Adequate lighting in your home to reduce risk of falls?: Yes Life alert?: No Use of a cane, walker or w/c?: Yes (CANE) Grab bars in the bathroom?: Yes Shower chair or bench in shower?: No Elevated toilet seat or a handicapped toilet?: No  TIMED UP AND GO:  Was the test performed?  No  Cognitive Function: Declined/Normal: No cognitive concerns noted by patient or family. Patient alert, oriented, able to answer questions appropriately and recall recent events. No signs of memory loss or confusion.    01/08/2024    4:30 PM  MMSE - Mini Mental State Exam  Not completed: Unable to complete        01/08/2024    4:34 PM 10/03/2022    3:37 PM 07/13/2021    9:58 AM  6CIT Screen  What Year? 0 points 0 points 0 points  What month? 0 points 0 points 0 points  What time? 0 points 0 points 0 points  Count back from 20 0 points 0 points 0 points  Months in reverse 0 points 0 points 0 points  Repeat phrase 0 points 0 points 0 points  Total Score 0 points 0 points 0 points    Immunizations Immunization History  Administered Date(s) Administered   Hepatitis A, Adult 03/02/2018   Influenza Split 03/14/2011, 02/28/2012   Influenza Whole 03/27/2007, 03/07/2008, 03/18/2009, 03/17/2010   Influenza, Seasonal, Injecte,  Preservative Fre 03/02/2023   Influenza,inj,Quad PF,6+ Mos 05/29/2013, 02/20/2014, 05/06/2015, 02/23/2016, 02/28/2017, 03/02/2018, 02/21/2019, 02/14/2020, 03/05/2021, 03/11/2022   Moderna Sars-Covid-2 Vaccination 08/19/2019, 09/16/2019   PFIZER(Purple Top)SARS-COV-2 Vaccination 03/27/2020, 09/28/2020   Pfizer Covid-19 Vaccine Bivalent Booster 31yrs & up 03/26/2021   Pfizer(Comirnaty)Fall Seasonal Vaccine 12 years and older 03/21/2023   Td 08/04/1997, 08/26/2008   Tdap 03/03/2016   Zoster Recombinant(Shingrix ) 08/31/2011, 10/31/2021    Screening Tests Health Maintenance  Topic Date Due   Hepatitis B Vaccines (1 of 3 - 19+ 3-dose series) Never done   Pneumococcal Vaccine: 50+ Years (1 of 1 - PCV) Never done   INFLUENZA VACCINE  01/05/2024   Medicare Annual Wellness (AWV)  01/07/2025   MAMMOGRAM  03/12/2025   DTaP/Tdap/Td (4 - Td or Tdap) 03/03/2026   Colonoscopy  12/01/2030   COVID-19 Vaccine  Completed   Hepatitis C Screening  Completed   HIV Screening  Completed   Zoster Vaccines- Shingrix   Completed   HPV VACCINES  Aged Out   Meningococcal B Vaccine  Aged Out    Health Maintenance  Health Maintenance Due  Topic Date Due   Hepatitis B Vaccines (1  of 3 - 19+ 3-dose series) Never done   Pneumococcal Vaccine: 50+ Years (1 of 1 - PCV) Never done   INFLUENZA VACCINE  01/05/2024   Health Maintenance Items Addressed: Yes Patient aware of current care gaps.   Additional Screening:  Vision Screening: Recommended annual ophthalmology exams for early detection of glaucoma and other disorders of the eye. Would you like a referral to an eye doctor? No    Dental Screening: Recommended annual dental exams for proper oral hygiene  Community Resource Referral / Chronic Care Management: CRR required this visit?  No   CCM required this visit?  No   Plan:    I have personally reviewed and noted the following in the patient's chart:   Medical and social history Use of alcohol,  tobacco or illicit drugs  Current medications and supplements including opioid prescriptions. Patient is not currently taking opioid prescriptions. Functional ability and status Nutritional status Physical activity Advanced directives List of other physicians Hospitalizations, surgeries, and ER visits in previous 12 months Vitals Screenings to include cognitive, depression, and falls Referrals and appointments  In addition, I have reviewed and discussed with patient certain preventive protocols, quality metrics, and best practice recommendations. A written personalized care plan for preventive services as well as general preventive health recommendations were provided to patient.   Roz LOISE Fuller, LPN   06/10/7972   After Visit Summary: (MyChart) Due to this being a telephonic visit, the after visit summary with patients personalized plan was offered to patient via MyChart   Notes: Patient aware of current care gaps.  Immunization record was verified by [employer].

## 2024-01-08 NOTE — Patient Instructions (Signed)
 Ms. Leibensperger , Thank you for taking time out of your busy schedule to complete your Annual Wellness Visit with me. I enjoyed our conversation and look forward to speaking with you again next year. I, as well as your care team,  appreciate your ongoing commitment to your health goals. Please review the following plan we discussed and let me know if I can assist you in the future. Your Game plan/ To Do List    Referrals: If you haven't heard from the office you've been referred to, please reach out to them at the phone provided.   Follow up Visits: We will see or speak with you next year for your Next Medicare AWV with our clinical staff Have you seen your provider in the last 6 months (3 months if uncontrolled diabetes)? Yes  Clinician Recommendations:  Aim for 30 minutes of exercise or brisk walking, 6-8 glasses of water, and 5 servings of fruits and vegetables each day.       This is a list of the screenings recommended for you:  Health Maintenance  Topic Date Due   Hepatitis B Vaccine (1 of 3 - 19+ 3-dose series) Never done   Pneumococcal Vaccine for age over 60 (1 of 1 - PCV) Never done   Flu Shot  01/05/2024   Medicare Annual Wellness Visit  01/07/2025   Mammogram  03/12/2025   DTaP/Tdap/Td vaccine (4 - Td or Tdap) 03/03/2026   Colon Cancer Screening  12/01/2030   COVID-19 Vaccine  Completed   Hepatitis C Screening  Completed   HIV Screening  Completed   Zoster (Shingles) Vaccine  Completed   HPV Vaccine  Aged Out   Meningitis B Vaccine  Aged Out    Advanced directives: (Provided) Advance directive discussed with you today. I have provided a copy for you to complete at home and have notarized. Once this is complete, please bring a copy in to our office so we can scan it into your chart.  Advance Care Planning is important because it:  [x]  Makes sure you receive the medical care that is consistent with your values, goals, and preferences  [x]  It provides guidance to your family  and loved ones and reduces their decisional burden about whether or not they are making the right decisions based on your wishes.  Follow the link provided in your after visit summary or read over the paperwork we have mailed to you to help you started getting your Advance Directives in place. If you need assistance in completing these, please reach out to us  so that we can help you!  See attachments for Preventive Care and Fall Prevention Tips.

## 2024-03-18 ENCOUNTER — Other Ambulatory Visit: Payer: Self-pay | Admitting: Family Medicine

## 2024-03-18 DIAGNOSIS — Z1231 Encounter for screening mammogram for malignant neoplasm of breast: Secondary | ICD-10-CM

## 2024-03-27 ENCOUNTER — Ambulatory Visit
Admission: RE | Admit: 2024-03-27 | Discharge: 2024-03-27 | Disposition: A | Discharge status: Home / Self Care | Source: Ambulatory Visit | Attending: Family Medicine | Admitting: Family Medicine

## 2024-03-27 DIAGNOSIS — Z1231 Encounter for screening mammogram for malignant neoplasm of breast: Secondary | ICD-10-CM | POA: Diagnosis not present

## 2024-04-01 ENCOUNTER — Other Ambulatory Visit: Payer: Self-pay

## 2024-04-01 ENCOUNTER — Ambulatory Visit: Payer: Self-pay | Admitting: Family Medicine

## 2024-04-01 MED ORDER — FLUTICASONE PROPIONATE 50 MCG/ACT NA SUSP: 2.0000 | Freq: Every day | NASAL | 6 refills | Status: AC

## 2024-04-08 ENCOUNTER — Encounter: Payer: Self-pay | Admitting: Radiology

## 2024-06-13 ENCOUNTER — Ambulatory Visit: Payer: Medicare Other | Admitting: Nurse Practitioner

## 2024-06-13 NOTE — Progress Notes (Unsigned)
 "  Allison Steele [birth year] [id number]   History:  54 y.o. G1P1001 presents for annual exam. S/P 2018 Robotic TLH/Bilateral Salpingectomy for fibroids. Normal pap history. HTN, anxiety and depression managed by PCP.  Gynecologic History Patient's last menstrual period was 04/26/2017.   Contraception/Family planning: status post hysterectomy Sexually active: No, due to partner's health  Health Maintenance Last Pap: 12/30/2021. Results were: Normal Last mammogram: 03/27/2024. Results were: Normal Last colonoscopy: 11/30/2020, 10 year recall Last Dexa: Not indicated     01/08/2024    4:30 PM  Depression screen PHQ 2/9  Decreased Interest 0  Down, Depressed, Hopeless 0  PHQ - 2 Score 0  Altered sleeping 0  Tired, decreased energy 0  Change in appetite 0  Feeling bad or failure about yourself  0  Trouble concentrating 0  Moving slowly or fidgety/restless 0  Suicidal thoughts 0  PHQ-9 Score 0   Difficult doing work/chores Not difficult at all     Data saved with a previous flowsheet row definition     Past medical history, past surgical history, family history and social history were all reviewed and documented in the EPIC chart. Boyfriend. 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 4 yo son.   ROS:  A ROS was performed and pertinent positives and negatives are included.  Exam:  There were no vitals filed for this visit.  There is no height or weight on file to calculate BMI.  General appearance:  Normal Thyroid:  Symmetrical, normal in size, without palpable masses or nodularity. Respiratory  Auscultation:  Clear without wheezing or rhonchi Cardiovascular  Auscultation:  Regular rate, without rubs, murmurs or gallops  Edema/varicosities:  Not grossly evident Abdominal  Soft,nontender, without masses, guarding or rebound.  Liver/spleen:  No organomegaly noted  Hernia:  None appreciated  Skin  Inspection:  Grossly normal Breasts: Examined  lying and sitting.   Right: Without masses, retractions, nipple discharge or axillary adenopathy.   Left: Without masses, retractions, nipple discharge or axillary adenopathy. Pelvic: External genitalia:  no lesions              Urethra:  normal appearing urethra with no masses, tenderness or lesions              Bartholins and Skenes: normal                 Vagina: normal appearing vagina with normal color and discharge, no lesions              Cervix: absent Bimanual Exam:  Uterus:  absent              Adnexa: no mass, fullness, tenderness              Rectovaginal: Deferred              Anus:  normal, no lesions   Assessment/Plan:  54 y.o. G1P1001 for annual exam.   Well female exam with routine gynecological exam - Education provided on SBEs, importance of preventative screenings, current guidelines, high calcium diet, regular exercise, and multivitamin daily. Labs with PCP.   Vaginal itching - Plan: WET PREP FOR TRICH, YEAST, CLUE. + clue cells.   Bacterial vaginosis - Plan: tinidazole  (TINDAMAX ) 500 MG tablet (2 grams) daily x 2 days. She prefers over Metronidazole . Feels she gets BV often. Avoid harsh soaps/body washes, start women's probiotic.   Screening for cervical cancer - Normal Pap history.  S/P 2018 Robotic TLH/Bilateral Salpingectomy for fibroids.  No longer screening per guidelines.   Screening for breast cancer - Normal mammogram history.  Continue annual screenings.  Normal breast exam today.  Screening for colon cancer - 2022 colonoscopy. Will repeat at 10-year interval per GI's recommendation.   Screening for osteoporosis - Average risk. Will plan DXA at age 89.  No follow-ups on file.   Annabella DELENA Shutter DNP, 10:22 AM 06/13/2024 "

## 2024-07-01 ENCOUNTER — Ambulatory Visit: Admitting: Family Medicine

## 2024-07-19 ENCOUNTER — Ambulatory Visit: Admitting: Family Medicine

## 2024-07-29 ENCOUNTER — Ambulatory Visit: Admitting: Family Medicine

## 2024-09-11 ENCOUNTER — Ambulatory Visit: Admitting: Pulmonary Disease
